# Patient Record
Sex: Female | Born: 1945 | Race: White | Hispanic: No | Smoking: Former smoker
Health system: Southern US, Community
[De-identification: ages and names within clinical notes are randomized; demographics above are authoritative.]

## PROBLEM LIST (undated history)

## (undated) DIAGNOSIS — F17200 Nicotine dependence, unspecified, uncomplicated: Secondary | ICD-10-CM

## (undated) DIAGNOSIS — S2231XA Fracture of one rib, right side, initial encounter for closed fracture: Secondary | ICD-10-CM

## (undated) DIAGNOSIS — J449 Chronic obstructive pulmonary disease, unspecified: Secondary | ICD-10-CM

## (undated) DIAGNOSIS — K3184 Gastroparesis: Secondary | ICD-10-CM

## (undated) DIAGNOSIS — K2980 Duodenitis without bleeding: Secondary | ICD-10-CM

## (undated) DIAGNOSIS — I219 Acute myocardial infarction, unspecified: Secondary | ICD-10-CM

## (undated) DIAGNOSIS — I10 Essential (primary) hypertension: Secondary | ICD-10-CM

## (undated) DIAGNOSIS — Z860100 Personal history of colon polyps, unspecified: Secondary | ICD-10-CM

## (undated) DIAGNOSIS — H409 Unspecified glaucoma: Secondary | ICD-10-CM

## (undated) DIAGNOSIS — I7090 Unspecified atherosclerosis: Secondary | ICD-10-CM

## (undated) DIAGNOSIS — K5730 Diverticulosis of large intestine without perforation or abscess without bleeding: Secondary | ICD-10-CM

## (undated) DIAGNOSIS — G43909 Migraine, unspecified, not intractable, without status migrainosus: Secondary | ICD-10-CM

## (undated) DIAGNOSIS — N816 Rectocele: Secondary | ICD-10-CM

## (undated) DIAGNOSIS — J302 Other seasonal allergic rhinitis: Secondary | ICD-10-CM

## (undated) DIAGNOSIS — E041 Nontoxic single thyroid nodule: Secondary | ICD-10-CM

## (undated) DIAGNOSIS — K209 Esophagitis, unspecified without bleeding: Secondary | ICD-10-CM

## (undated) DIAGNOSIS — E785 Hyperlipidemia, unspecified: Secondary | ICD-10-CM

## (undated) DIAGNOSIS — H269 Unspecified cataract: Secondary | ICD-10-CM

## (undated) DIAGNOSIS — M1990 Unspecified osteoarthritis, unspecified site: Secondary | ICD-10-CM

## (undated) DIAGNOSIS — I2510 Atherosclerotic heart disease of native coronary artery without angina pectoris: Secondary | ICD-10-CM

## (undated) DIAGNOSIS — R42 Dizziness and giddiness: Secondary | ICD-10-CM

## (undated) DIAGNOSIS — C4490 Unspecified malignant neoplasm of skin, unspecified: Secondary | ICD-10-CM

## (undated) DIAGNOSIS — D351 Benign neoplasm of parathyroid gland: Secondary | ICD-10-CM

## (undated) DIAGNOSIS — R112 Nausea with vomiting, unspecified: Secondary | ICD-10-CM

## (undated) DIAGNOSIS — R079 Chest pain, unspecified: Secondary | ICD-10-CM

## (undated) DIAGNOSIS — C55 Malignant neoplasm of uterus, part unspecified: Secondary | ICD-10-CM

## (undated) DIAGNOSIS — K219 Gastro-esophageal reflux disease without esophagitis: Secondary | ICD-10-CM

## (undated) DIAGNOSIS — E559 Vitamin D deficiency, unspecified: Secondary | ICD-10-CM

## (undated) DIAGNOSIS — M5126 Other intervertebral disc displacement, lumbar region: Secondary | ICD-10-CM

## (undated) DIAGNOSIS — R0602 Shortness of breath: Secondary | ICD-10-CM

## (undated) DIAGNOSIS — F419 Anxiety disorder, unspecified: Secondary | ICD-10-CM

## (undated) DIAGNOSIS — E1143 Type 2 diabetes mellitus with diabetic autonomic (poly)neuropathy: Secondary | ICD-10-CM

## (undated) DIAGNOSIS — K2940 Chronic atrophic gastritis without bleeding: Secondary | ICD-10-CM

## (undated) DIAGNOSIS — K222 Esophageal obstruction: Secondary | ICD-10-CM

## (undated) DIAGNOSIS — Z87820 Personal history of traumatic brain injury: Secondary | ICD-10-CM

## (undated) DIAGNOSIS — H04123 Dry eye syndrome of bilateral lacrimal glands: Secondary | ICD-10-CM

## (undated) DIAGNOSIS — K469 Unspecified abdominal hernia without obstruction or gangrene: Secondary | ICD-10-CM

## (undated) DIAGNOSIS — Z9889 Other specified postprocedural states: Secondary | ICD-10-CM

## (undated) DIAGNOSIS — M8580 Other specified disorders of bone density and structure, unspecified site: Secondary | ICD-10-CM

## (undated) DIAGNOSIS — M797 Fibromyalgia: Secondary | ICD-10-CM

## (undated) HISTORY — DX: Type 2 diabetes mellitus with diabetic autonomic (poly)neuropathy: E11.43

## (undated) HISTORY — PX: OTHER SURGICAL HISTORY: SHX169

## (undated) HISTORY — DX: Duodenitis without bleeding: K29.80

## (undated) HISTORY — PX: COLONOSCOPY: SHX174

## (undated) HISTORY — DX: Unspecified cataract: H26.9

## (undated) HISTORY — PX: TUBAL LIGATION: SHX77

## (undated) HISTORY — DX: Esophagitis, unspecified without bleeding: K20.90

## (undated) HISTORY — DX: Personal history of colonic polyps: Z86.010

## (undated) HISTORY — DX: Nontoxic single thyroid nodule: E04.1

## (undated) HISTORY — PX: APPENDECTOMY: SHX54

## (undated) HISTORY — PX: CHOLECYSTECTOMY: SHX55

## (undated) HISTORY — PX: BLADDER SURGERY: SHX569

## (undated) HISTORY — PX: ESOPHAGOGASTRODUODENOSCOPY: SHX1529

## (undated) HISTORY — DX: Personal history of colon polyps, unspecified: Z86.0100

## (undated) HISTORY — DX: Nicotine dependence, unspecified, uncomplicated: F17.200

## (undated) HISTORY — DX: Other specified disorders of bone density and structure, unspecified site: M85.80

## (undated) HISTORY — DX: Esophagitis, unspecified: K20.9

## (undated) HISTORY — DX: Esophageal obstruction: K22.2

## (undated) HISTORY — DX: Fibromyalgia: M79.7

## (undated) HISTORY — DX: Atherosclerotic heart disease of native coronary artery without angina pectoris: I25.10

## (undated) HISTORY — DX: Chronic obstructive pulmonary disease, unspecified: J44.9

## (undated) HISTORY — DX: Unspecified osteoarthritis, unspecified site: M19.90

## (undated) HISTORY — DX: Other seasonal allergic rhinitis: J30.2

## (undated) HISTORY — DX: Migraine, unspecified, not intractable, without status migrainosus: G43.909

## (undated) HISTORY — DX: Hyperlipidemia, unspecified: E78.5

## (undated) HISTORY — DX: Unspecified glaucoma: H40.9

## (undated) HISTORY — DX: Malignant neoplasm of uterus, part unspecified: C55

## (undated) HISTORY — PX: TONSILLECTOMY: SUR1361

## (undated) HISTORY — PX: LEEP: SHX91

## (undated) HISTORY — DX: Gastroparesis: K31.84

## (undated) HISTORY — DX: Chronic atrophic gastritis without bleeding: K29.40

## (undated) HISTORY — PX: DILATION AND EVACUATION: SHX1459

---

## 1987-02-07 DIAGNOSIS — C55 Malignant neoplasm of uterus, part unspecified: Secondary | ICD-10-CM

## 1987-02-07 HISTORY — DX: Malignant neoplasm of uterus, part unspecified: C55

## 1987-12-08 HISTORY — PX: VAGINAL HYSTERECTOMY: SUR661

## 1998-02-19 ENCOUNTER — Encounter: Admission: RE | Admit: 1998-02-19 | Discharge: 1998-05-20 | Payer: Self-pay | Admitting: Anesthesiology

## 1998-06-14 ENCOUNTER — Other Ambulatory Visit: Admission: RE | Admit: 1998-06-14 | Discharge: 1998-06-14 | Payer: Self-pay | Admitting: Obstetrics & Gynecology

## 1999-08-09 ENCOUNTER — Other Ambulatory Visit: Admission: RE | Admit: 1999-08-09 | Discharge: 1999-08-09 | Payer: Self-pay | Admitting: Obstetrics and Gynecology

## 2000-05-15 ENCOUNTER — Encounter (INDEPENDENT_AMBULATORY_CARE_PROVIDER_SITE_OTHER): Payer: Self-pay | Admitting: Gastroenterology

## 2000-10-02 ENCOUNTER — Other Ambulatory Visit: Admission: RE | Admit: 2000-10-02 | Discharge: 2000-10-02 | Payer: Self-pay | Admitting: Obstetrics and Gynecology

## 2001-02-08 ENCOUNTER — Emergency Department (HOSPITAL_COMMUNITY): Admission: EM | Admit: 2001-02-08 | Discharge: 2001-02-08 | Payer: Self-pay | Admitting: Emergency Medicine

## 2001-02-08 ENCOUNTER — Encounter: Payer: Self-pay | Admitting: Emergency Medicine

## 2001-02-11 ENCOUNTER — Observation Stay (HOSPITAL_COMMUNITY): Admission: RE | Admit: 2001-02-11 | Discharge: 2001-02-12 | Payer: Self-pay | Admitting: Urology

## 2001-02-11 ENCOUNTER — Encounter: Payer: Self-pay | Admitting: Urology

## 2001-08-13 ENCOUNTER — Encounter: Payer: Self-pay | Admitting: Family Medicine

## 2001-08-13 ENCOUNTER — Encounter: Admission: RE | Admit: 2001-08-13 | Discharge: 2001-08-13 | Payer: Self-pay | Admitting: Family Medicine

## 2001-11-20 ENCOUNTER — Other Ambulatory Visit: Admission: RE | Admit: 2001-11-20 | Discharge: 2001-11-20 | Payer: Self-pay | Admitting: Obstetrics and Gynecology

## 2002-12-10 ENCOUNTER — Other Ambulatory Visit: Admission: RE | Admit: 2002-12-10 | Discharge: 2002-12-10 | Payer: Self-pay | Admitting: Obstetrics and Gynecology

## 2003-01-22 ENCOUNTER — Emergency Department (HOSPITAL_COMMUNITY): Admission: EM | Admit: 2003-01-22 | Discharge: 2003-01-22 | Payer: Self-pay | Admitting: Emergency Medicine

## 2003-03-10 DIAGNOSIS — M5126 Other intervertebral disc displacement, lumbar region: Secondary | ICD-10-CM

## 2003-03-10 DIAGNOSIS — M5136 Other intervertebral disc degeneration, lumbar region: Secondary | ICD-10-CM

## 2003-03-10 DIAGNOSIS — M51369 Other intervertebral disc degeneration, lumbar region without mention of lumbar back pain or lower extremity pain: Secondary | ICD-10-CM

## 2003-03-10 HISTORY — DX: Other intervertebral disc degeneration, lumbar region without mention of lumbar back pain or lower extremity pain: M51.369

## 2003-03-10 HISTORY — DX: Other intervertebral disc displacement, lumbar region: M51.26

## 2003-03-10 HISTORY — DX: Other intervertebral disc degeneration, lumbar region: M51.36

## 2003-03-13 ENCOUNTER — Encounter: Admission: RE | Admit: 2003-03-13 | Discharge: 2003-03-13 | Payer: Self-pay | Admitting: Orthopedic Surgery

## 2003-07-29 ENCOUNTER — Encounter: Admission: RE | Admit: 2003-07-29 | Discharge: 2003-07-29 | Payer: Self-pay | Admitting: Family Medicine

## 2004-11-14 ENCOUNTER — Ambulatory Visit: Payer: Self-pay | Admitting: Gastroenterology

## 2004-11-16 ENCOUNTER — Ambulatory Visit: Payer: Self-pay | Admitting: Cardiology

## 2004-12-06 ENCOUNTER — Ambulatory Visit: Payer: Self-pay | Admitting: Gastroenterology

## 2004-12-15 ENCOUNTER — Ambulatory Visit: Payer: Self-pay | Admitting: Gastroenterology

## 2005-03-02 ENCOUNTER — Other Ambulatory Visit: Admission: RE | Admit: 2005-03-02 | Discharge: 2005-03-02 | Payer: Self-pay | Admitting: Obstetrics and Gynecology

## 2005-10-03 ENCOUNTER — Encounter: Admission: RE | Admit: 2005-10-03 | Discharge: 2005-10-03 | Payer: Self-pay | Admitting: Family Medicine

## 2007-03-22 ENCOUNTER — Ambulatory Visit: Payer: Self-pay | Admitting: Internal Medicine

## 2007-04-26 ENCOUNTER — Encounter: Payer: Self-pay | Admitting: Internal Medicine

## 2007-04-26 ENCOUNTER — Ambulatory Visit: Payer: Self-pay | Admitting: Internal Medicine

## 2007-05-02 DIAGNOSIS — Z860100 Personal history of colon polyps, unspecified: Secondary | ICD-10-CM | POA: Insufficient documentation

## 2008-07-02 ENCOUNTER — Ambulatory Visit (HOSPITAL_BASED_OUTPATIENT_CLINIC_OR_DEPARTMENT_OTHER): Admission: RE | Admit: 2008-07-02 | Discharge: 2008-07-02 | Payer: Self-pay | Admitting: General Surgery

## 2008-07-02 ENCOUNTER — Encounter (INDEPENDENT_AMBULATORY_CARE_PROVIDER_SITE_OTHER): Payer: Self-pay | Admitting: General Surgery

## 2010-05-17 LAB — CBC
HCT: 42.3 % (ref 36.0–46.0)
Hemoglobin: 14.7 g/dL (ref 12.0–15.0)
MCHC: 34.8 g/dL (ref 30.0–36.0)
MCV: 88.9 fL (ref 78.0–100.0)
Platelets: 238 10*3/uL (ref 150–400)
RDW: 13.6 % (ref 11.5–15.5)

## 2010-05-17 LAB — DIFFERENTIAL
Basophils Absolute: 0 10*3/uL (ref 0.0–0.1)
Eosinophils Absolute: 0.1 10*3/uL (ref 0.0–0.7)
Eosinophils Relative: 2 % (ref 0–5)
Lymphocytes Relative: 29 % (ref 12–46)
Monocytes Absolute: 0.4 10*3/uL (ref 0.1–1.0)

## 2010-05-17 LAB — GLUCOSE, CAPILLARY
Glucose-Capillary: 115 mg/dL — ABNORMAL HIGH (ref 70–99)
Glucose-Capillary: 138 mg/dL — ABNORMAL HIGH (ref 70–99)

## 2010-05-17 LAB — BASIC METABOLIC PANEL
BUN: 7 mg/dL (ref 6–23)
CO2: 28 mEq/L (ref 19–32)
Chloride: 103 mEq/L (ref 96–112)
GFR calc non Af Amer: >60 mL/min (ref >60)
Glucose, Bld: 165 mg/dL — ABNORMAL HIGH (ref 70–99)
Potassium: 4.9 mEq/L (ref 3.5–5.1)
Sodium: 138 mEq/L (ref 135–145)

## 2010-06-21 NOTE — Assessment & Plan Note (Signed)
[facility]                         GASTROENTEROLOGY OFFICE NOTE   NAME:Wood, Valerie Wood                        MRN:          [id number]  DATE:03/22/2007                            DOB:          11-02-1945    CHIEF COMPLAINT:  Epigastric pain, indigestion.   Valerie Wood is a lady previously followed by Dr. Corinda Gubler.  She has known  reflux disease, even a history of peptic stricture dilated in 2006 at  EGD.  She somehow got off of her Prilosec because she was feeling well.  She is now having daily burning and heartburn, cannot sleep either.  She  is using Pepcid AC with incomplete relief.  There is no dysphagia.   She has some chronic diarrhea symptoms which are actually slightly  better on the antacid she is taking.  The chronic diarrhea was worse,  post cholecystectomy.  She has had the chronic smoker's cough.  She also  has had some sinus drainage and a scanty amount of hemoptysis the other  day but this has not recurred.  She is advised to follow with Dr.  Arvilla Market on that if it does recur.   She does not drink much caffeine.  She is a smoker and she is advised  that quitting is to her benefit.  She has quit multiple times before but  cannot stay off, she says.   PAST MEDICAL HISTORY:  1. Diabetes mellitus, diet-controlled.  2. Dyslipidemia.  3. Cholecystectomy.  4. Hypertension.  5. Gastroesophageal reflux disease with stricture and hiatal hernia.  6. Anxiety and panic disorder.  7. Prior appendectomy.  8. Prior tubal ligation.  9. Hysterectomy, she does not know if she has had an oophorectomy.  10.She had cervical cancer.  11.Irritable bowel syndrome from review of the chart.  She concurs.  12.Chronic headaches.  13.Chronic vertigo.  14.Allergic rhinosinusitis.  15.History of adenomatous colon polyp in 1998.  16.Colonoscopy 2002, with multiple diminutive polyps destroyed but no      pathology.   MEDICATIONS:  Vytorin, Lisinopril,  Pepcid-AC.  She is on Meclizine,  Dramamine, Rolaids, Mylanta, and Mucinex p.r.n.   ALLERGIES:  MORPHINE.   SOCIAL HISTORY:  She is caring for a grandchild whom she has adopted.  She is retired for 8 years.  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  Rare alcohol.  Tobacco as above.   PHYSICAL EXAMINATION:  VITAL SIGNS:  Weight 162 pounds, pulse 82, blood  pressure 130/84.  EYES:  Anicteric.  LUNGS:  Clear.  HEART:  S1 S2.  No gallops.  ABDOMEN:  Soft, nontender, no organomegaly, no masses.  NEUROLOGIC:  She is alert and oriented x3.   ASSESSMENT:  1. Gastroesophageal reflux disease, worse off of proton pump      inhibitor.  Plan:  Restart Prilosec OTC at twice a day for a month      and then daily.  We have prescribed what I think is generic      omeprazole which may be more cost effective for her through her 3-      month prescription program.  2. Personal  history of colon polyps and there is actually a family      history of colon cancer as well.  Her father died in his 28s.      Colonoscopy will be scheduled for followup surveillance.  3. Diarrhea predominant irritable bowel syndrome.  Some of this may be      post cholecystectomy diarrhea.  I have advised her to use Imodium      to see if that does not help.     Iva Boop, MD,FACG  Electronically Signed    CEG/MedQ  DD: 03/22/2007  DT: 03/24/2007  Job #: [id number]   cc:   Donia Guiles, M.D.

## 2010-06-21 NOTE — Op Note (Signed)
NAMEAMRY, CATHY                 ACCOUNT NO.:  [id number]   MEDICAL RECORD NO.:  [id number]          PATIENT TYPE:  AMB   LOCATION:  DSC                          FACILITY:  [facility]   PHYSICIAN:  Almond Lint, MD       DATE OF BIRTH:  [birth year]   DATE OF PROCEDURE:  07/02/2008  DATE OF DISCHARGE:                               OPERATIVE REPORT   PREOPERATIVE DIAGNOSIS:  Left breast masses.   POSTOPERATIVE DIAGNOSIS:  Left breast masses.   PROCEDURE PERFORMED:  Excision of left breast masses x4.   SURGEON:  Almond Lint, MD   ASSISTANT:  None.   ANESTHESIA:  General and local.   FINDINGS:  Questionable lipomas.  No firm masses in the region of  palpable abnormalities.   SPECIMENS:  Breast biopsy x4 to Pathology.   ESTIMATED BLOOD LOSS:  Minimal.   COMPLICATIONS:  None known.   PROCEDURE:  Ms. Hornberger was identified in the holding area, and the  regions of abnormality were marked preoperatively.  These were marked in  the supine position with the head slightly elevated.  The patient was  then taken to the operating room and placed supine on the operating room  table.  General anesthesia was induced.  The left breast was prepped and  draped in sterile fashion.  Time-out was performed according to surgical  safety checklist.  When all was correct, we continued.  A radial  incision approximately 3 cm in length was made approximately 8 o'clock  near the perforated breast.  The incision was made such that all of the  regions of abnormality could be reached from the same incision.  The  skin was anesthetized with local anesthesia and the incision made.  Skin  flaps were created with the assistance of skin hooks.  The most distinct  abnormality was identified first and elevated with an Allis clamp.  This  was then removed with a Bovie.  Upon palpation, it felt like just a  fatty tissue.  The abnormality was then gone after removing this area.  A second adjacent abnormality was also  elevated with an Allis and  removed.  This also felt like a fatty tissue.  Other abnormality was  much less defined.  There was no significant areas palpable from the  left side of the breast, so I switched to the right side.  There were 2  small areas of thickening that were identified; they felt like normal  breast tissue.  These were in the region where the third area had been  identified by the patient.  These were removed.  These were labeled from  medial to lateral.  The cavity was then inspected for hemostasis.  When  hemostasis was achieved, we then closed the incision with 3-0  Vicryl, deep dermal interrupted, and then 4-0 Monocryl in a running  subcuticular fashion.  The breast was then cleaned, dried, and dressed  with Steri-Strips, gauze, and paper tape.  The patient tolerated the  procedure well and was taken to the PACU in stable condition.      Darrick Huntsman  Donell Beers, MD  Electronically Signed     FB/MEDQ  D:  07/02/2008  T:  07/03/2008  Job:  [id number]

## 2010-06-24 NOTE — Op Note (Signed)
[facility]  Patient:    Valerie Wood, Valerie Wood Visit Number: [id number] MRN: [id number]          Service Type: SUR Location: 3W 0375 02 Attending Physician:  Thermon Leyland Dictated by:   Barron Alvine, M.D. Proc. Date: 02/11/01 Admit Date:  02/11/2001                             Operative Report  PREOPERATIVE DIAGNOSES: 1. Grade 3 cystocele. 2. Stress urinary incontinence.  POSTOPERATIVE DIAGNOSES: 1. Grade 3 cystocele. 2. Stress urinary incontinence.  PROCEDURE PERFORMED:  Anterior repair, pubovaginal sling with cadaveric fascia lata, suprapubic tube placement, and flexible cystoscopy.  SURGEON:  Barron Alvine, M.D.  ANESTHESIA:  General.  INDICATIONS:  The patient is a 65 year old female.  She has a longstanding history of irritative voiding symptoms as well as mixed urinary incontinence. We saw her initially.  She was having nocturia three times per night with severe frequency every 30 minutes.  She also had considerable stress incontinence with evidence of a significant cystocele, hypermobility, and obvious stress incontinence.  She has been placed on antispasmodic medication, to help with her frequency and urgency which did improve, but her incontinence continued to be unchanged.  We offered her the possibility of pubovaginal sling with anterior repair.  We spent extensive time discussing how the procedure was done with the typical success rate, complications, etc.  Full informed consent was obtained and the patient presents now for that procedure.  TECHNIQUE AND FINDINGS:  The patient was brought to the operating room where she had the successful induction of general anesthesia.  She was placed in the moderate lithotomy position, and prepped and draped in the usual manner.  A Foley catheter was placed sterilely, and her bladder was drained.  A vaginal speculum was used.  She had a grade 3 cystocele at rest.  The anterior vaginal mucosa  was infiltrated and then an incision was made from mid urethra back to her scarred surgical cuff.  The tissue planes between the vagina and bladder were easily established.  The entire cystocele was dissected.  Retropubic spaces were entered with the bladder decompressed.  Fingertip dissection was all that was necessary to enter the retropubic spaces bilaterally, and those tissues were noted to be markedly attenuated.  The cystocele itself was then reduced with approximately four 2-0 Vicryl mattress sutures.  With this, we had nice reduction of the cystocele.  We then turned our attention suprapubically, where a small incision was made over the pubic symphysis, and carried down to the level of the anterior fascia.  The sling itself was made with a piece of cadaveric fascia lata measuring approximately 2.5 cm in width and approximately 8 cm in length.  Both ends were sutured with some #1 nylon suture.  With a finger in the retropubic space, we were able to pass the clamp behind the pubic bone, and out the right side of the vaginal incision.  Right fingertip control was used throughout the passage.  The nylon sutures were grabbed and brought out the right-sided suprapubic incision.  The same thing was done on the left side, and in that manner, the sling was positioned underneath the proximal urethra.  We then removed her Foley catheter and performed flexible cystoscopy.  This showed the sling to be in good position. There was no evidence of suture or other bladder injury.  Blue dye could be seen from both orifices.  The suprapubic tube was then placed under direct visual guidance.  We then trimmed an appropriate amount of vaginal mucosa with reduction of the cystocele.  The vaginal incision was then closed with a running 2-0 Vicryl suture.  Everything was copiously irrigated with antibiotic solution.  The suprapubic incision was then closed after tying the sling over two fingers.  Marcaine was  also instilled in the suprapubic incision.  Clips were used to close that.  The suprapubic tube was left to gravity drainage. The Foley catheter was removed.  The vaginal packing was applied.  The patient appeared to tolerate the procedure well.  There were no obvious complications.  She was brought to the recovery room in stable condition. Dictated by:   Barron Alvine, M.D. Attending Physician:  Thermon Leyland DD:  02/11/01 TD:  02/11/01 Job: [id number]

## 2011-01-20 ENCOUNTER — Ambulatory Visit (INDEPENDENT_AMBULATORY_CARE_PROVIDER_SITE_OTHER): Payer: Medicare Other | Admitting: Internal Medicine

## 2011-01-20 DIAGNOSIS — R05 Cough: Secondary | ICD-10-CM

## 2011-01-20 LAB — PULMONARY FUNCTION TEST

## 2011-01-20 NOTE — Progress Notes (Signed)
PFT done today. 

## 2011-01-29 ENCOUNTER — Emergency Department (HOSPITAL_COMMUNITY): Payer: Medicare Other

## 2011-01-29 ENCOUNTER — Other Ambulatory Visit: Payer: Self-pay

## 2011-01-29 ENCOUNTER — Emergency Department (HOSPITAL_COMMUNITY)
Admission: EM | Admit: 2011-01-29 | Discharge: 2011-01-29 | Disposition: A | Discharge status: Home / Self Care | Payer: Medicare Other | Attending: Emergency Medicine | Admitting: Emergency Medicine

## 2011-01-29 ENCOUNTER — Encounter: Payer: Self-pay | Admitting: Nurse Practitioner

## 2011-01-29 DIAGNOSIS — R079 Chest pain, unspecified: Secondary | ICD-10-CM | POA: Insufficient documentation

## 2011-01-29 DIAGNOSIS — J40 Bronchitis, not specified as acute or chronic: Secondary | ICD-10-CM | POA: Insufficient documentation

## 2011-01-29 DIAGNOSIS — F17200 Nicotine dependence, unspecified, uncomplicated: Secondary | ICD-10-CM | POA: Insufficient documentation

## 2011-01-29 DIAGNOSIS — E041 Nontoxic single thyroid nodule: Secondary | ICD-10-CM | POA: Insufficient documentation

## 2011-01-29 HISTORY — DX: Essential (primary) hypertension: I10

## 2011-01-29 LAB — CBC
HCT: 42.7 % (ref 36.0–46.0)
Hemoglobin: 15 g/dL (ref 12.0–15.0)
RDW: 13.6 % (ref 11.5–15.5)
WBC: 9.7 10*3/uL (ref 4.0–10.5)

## 2011-01-29 LAB — BASIC METABOLIC PANEL
BUN: 9 mg/dL (ref 6–23)
Chloride: 103 mEq/L (ref 96–112)
GFR calc Af Amer: >90 mL/min (ref >90)
GFR calc non Af Amer: >90 mL/min (ref >90)
Glucose, Bld: 138 mg/dL — ABNORMAL HIGH (ref 70–99)
Potassium: 3.8 mEq/L (ref 3.5–5.1)
Sodium: 137 mEq/L (ref 135–145)

## 2011-01-29 LAB — POCT I-STAT TROPONIN I

## 2011-01-29 MED ORDER — AZITHROMYCIN 250 MG PO TABS: 250.0000 mg | ORAL_TABLET | Freq: Every day | ORAL | Status: AC

## 2011-01-29 MED ORDER — ALBUTEROL SULFATE HFA 108 (90 BASE) MCG/ACT IN AERS
2.0000 | INHALATION_SPRAY | RESPIRATORY_TRACT | Status: DC | PRN
Start: 2011-01-29 — End: 2011-01-29
  Administered 2011-01-29: 2 via RESPIRATORY_TRACT
  Filled 2011-01-29: qty 6.7

## 2011-01-29 MED ORDER — SODIUM CHLORIDE 0.9 % IV SOLN: Freq: Once | INTRAVENOUS | Status: DC

## 2011-01-29 MED ORDER — AZITHROMYCIN 250 MG PO TABS
500.0000 mg | ORAL_TABLET | Freq: Once | ORAL | Status: AC
  Administered 2011-01-29: 500 mg via ORAL
  Filled 2011-01-29: qty 2

## 2011-01-29 MED ORDER — IOHEXOL 350 MG/ML SOLN: 100.0000 mL | Freq: Once | INTRAVENOUS | Status: DC | PRN

## 2011-01-29 MED ORDER — ONDANSETRON HCL 4 MG/2ML IJ SOLN
4.0000 mg | Freq: Once | INTRAMUSCULAR | Status: AC
  Administered 2011-01-29: 4 mg via INTRAVENOUS
  Filled 2011-01-29: qty 2

## 2011-01-29 MED ORDER — MORPHINE SULFATE 2 MG/ML IJ SOLN
2.0000 mg | Freq: Once | INTRAMUSCULAR | Status: AC
  Administered 2011-01-29: 2 mg via INTRAVENOUS
  Filled 2011-01-29: qty 1

## 2011-01-29 NOTE — ED Notes (Signed)
Patient transported to CT 

## 2011-01-29 NOTE — ED Notes (Signed)
Printed two old ekgs from [facility]. 

## 2011-01-29 NOTE — ED Provider Notes (Signed)
History     CSN: [id number]  Arrival date & time 01/29/11  1227   First MD Initiated Contact with Patient 01/29/11 1341      Chief Complaint  Patient presents with  . Chest Pain    (Consider location/radiation/quality/duration/timing/severity/associated sxs/prior treatment) HPI  Pt presents to the ED with complaints of chest pressure that radiates to the back. Pt has a history of pneumonia 1 month ago and doesn't believe that it went away. Pt still has a cough which she describes has blood in her sputum. She denies shortness of breath or difficulty breathing. She denies having a history of heart problems in the past. Denies weakness, fevers, chills. A&O x 3, NAD.  Past Medical History  Diagnosis Date  . Hypertension     Past Surgical History  Procedure Date  . Cholecystectomy   . Abdominal hysterectomy   . Appendectomy   . Tonsillectomy     History reviewed. No pertinent family history.  History  Substance Use Topics  . Smoking status: Current Everyday Smoker -- 1.0 packs/day  . Smokeless tobacco: Not on file  . Alcohol Use: No    OB History    Grav Para Term Preterm Abortions TAB SAB Ect Mult Living                  Review of Systems  Allergies  Morphine and related and Tetracyclines & related  Home Medications   Current Outpatient Rx  Name Route Sig Dispense Refill  . BUDESONIDE-FORMOTEROL FUMARATE 160-4.5 MCG/ACT IN AERO Inhalation Inhale 2 puffs into the lungs at bedtime.      Marland Kitchen CALTRATE 600+D PLUS 600-400 MG-UNIT PO TABS Oral Take 1 tablet by mouth at bedtime.      Marland Kitchen EZETIMIBE-SIMVASTATIN 10-40 MG PO TABS Oral Take 1 tablet by mouth at bedtime.      . GUAIFENESIN ER 600 MG PO TB12 Oral Take 1,200 mg by mouth at bedtime.      Marland Kitchen LOSARTAN POTASSIUM 50 MG PO TABS Oral Take 50 mg by mouth every evening.      Marland Kitchen OMEPRAZOLE 20 MG PO CPDR Oral Take 20 mg by mouth at bedtime.        BP 113/78  Pulse 86  Temp(Src) 97.7 F (36.5 C) (Oral)  Resp 18  Ht 5'  3" (1.6 m)  Wt 158 lb (71.668 kg)  BMI 27.99 kg/m2  SpO2 98%  Physical Exam  Nursing note and vitals reviewed. Constitutional: She appears well-developed and well-nourished.  HENT:  Head: Normocephalic and atraumatic.  Eyes: Conjunctivae are normal. Pupils are equal, round, and reactive to light.  Neck: Trachea normal, normal range of motion and full passive range of motion without pain. Neck supple.  Cardiovascular: Normal rate, regular rhythm and normal pulses.   Pulmonary/Chest: Effort normal and breath sounds normal. No respiratory distress. She has no wheezes. She has no rales. Chest wall is not dull to percussion. She exhibits no tenderness, no crepitus, no edema, no deformity and no retraction.       cough  Abdominal: Soft. Normal appearance and bowel sounds are normal.  Musculoskeletal: Normal range of motion.  Neurological: She is alert. She has normal strength.  Skin: Skin is warm, dry and intact.  Psychiatric: Her speech is normal. Cognition and memory are normal.    ED Course  Procedures (including critical care time)   Labs Reviewed  I-STAT TROPONIN I  CBC  BASIC METABOLIC PANEL   Dg Chest 2 View  01/29/2011  *  RADIOLOGY REPORT*  Clinical Data: Chest pain, shortness of breath  CHEST - 2 VIEW  Comparison: 12/20/2009  Findings: Normal heart size with stable mild interstitial prominence.  Slightly lower lung volumes.  No definite pneumonia, collapse, consolidation, effusion or pneumothorax.  Trachea midline.  Previous cholecystectomy noted.  IMPRESSION: Stable chest exam.  No superimposed acute process.  Slight interstitial prominence.  Original Report Authenticated By: Judie Petit. Ruel Favors, M.D.     No diagnosis found.    MDM   Date: 01/29/2011  Rate: 84  Rhythm: normal sinus rhythm  QRS Axis: left  Intervals: normal  ST/T Wave abnormalities: nonspecific ST/T changes  Conduction Disutrbances:none  Narrative Interpretation:   Old EKG Reviewed: unchanged from  2003  Dr. Deretha Emory examined patient and believes symptoms are most likely not cardiac he recommends CT angio of chest to r/o PE.  Will order CT angio chest to r/o PE due to cough, pressure with blood tinged sputum. If negative, pt can be diagnosed with bronchitis, given a zpack and inhaler and told to follow-up with her PCP.        Dorthula Matas, PA 01/29/11 [phone]

## 2011-01-29 NOTE — ED Provider Notes (Signed)
Medical screening examination/treatment/procedure(s) were performed by non-physician practitioner and as supervising physician I was immediately available for consultation/collaboration.    M , MD 01/29/11 2343 

## 2011-01-29 NOTE — ED Provider Notes (Signed)
Medical screening examination/treatment/procedure(s) were conducted as a shared visit with non-physician practitioner(s) and myself.  I personally evaluated the patient during the encounter  Patient seen by me. Productive cough with some blood streak sputum also mild shortness of breath for the past 2 days or longer. She with anterior chest pain that radiates to the upper back. No leg swell. Has had chest discomfort for a number of days on and off more on than off. Cardiac marker negative EKG without acute change. Suspect a viral bronchitis however with the pain in the chest: The back and some bloody sputum will get CT angiogram rule out PE. If negative will treat as if bronchitis with albuterol inhaler and a trial Z-Pak.  Patient can be moved to the CDU awaiting the CT angio.  Shelda Jakes, MD 01/29/11 [phone]

## 2011-01-29 NOTE — ED Notes (Signed)
Pt c/o mid-sternum chest pain radiating to mid-back region and to bilateral sides x3 days, sob, productive cough w/yellow color sputum w/streaks of bright red blood, and mid abd pain, pt denies N/V or sweating. Pt's husband recently dx w/Parkinsons on December 17, pt denies any relation to husband new dx. Pt reports she was dx last Sept./Oct w/pneumonia, sts her s/s feel similar. Pt also reports a burning sensation to her tongue

## 2011-01-29 NOTE — ED Notes (Signed)
Patients ekg done and given to dr. Alto Denver.

## 2011-01-29 NOTE — ED Notes (Signed)
Called CT Scan and they are aware that patient is ready for the scanner

## 2011-01-29 NOTE — ED Notes (Signed)
Pt given po fluid and crackers

## 2011-01-29 NOTE — ED Notes (Signed)
C/o chest pain, bloody sputum, mild SOB over past 2 days. Nothing improves or relieves the pain. Pain radiating into upper back. No recent travel

## 2011-01-29 NOTE — ED Provider Notes (Signed)
Radiology results reviewed and discussed with patient.  No evidence of PE.  Small thyroid nodule incidentally noted--will have patient follow-up with her PCP.  Patient will be discharged home with z-pack and albuterol MDI for suspected bronchitis.  Jimmye Norman, NP 01/29/11 2027

## 2011-02-03 ENCOUNTER — Encounter: Payer: Self-pay | Admitting: Internal Medicine

## 2011-02-08 ENCOUNTER — Other Ambulatory Visit: Payer: Self-pay | Admitting: Family Medicine

## 2011-02-08 DIAGNOSIS — E041 Nontoxic single thyroid nodule: Secondary | ICD-10-CM

## 2011-02-09 ENCOUNTER — Ambulatory Visit
Admission: RE | Admit: 2011-02-09 | Discharge: 2011-02-09 | Disposition: A | Discharge status: Home / Self Care | Payer: Medicare Other | Source: Ambulatory Visit | Attending: Family Medicine | Admitting: Family Medicine

## 2011-02-09 DIAGNOSIS — E041 Nontoxic single thyroid nodule: Secondary | ICD-10-CM

## 2011-02-13 ENCOUNTER — Other Ambulatory Visit: Payer: Self-pay | Admitting: Family Medicine

## 2011-02-13 DIAGNOSIS — E041 Nontoxic single thyroid nodule: Secondary | ICD-10-CM

## 2011-02-15 ENCOUNTER — Other Ambulatory Visit: Payer: Self-pay | Admitting: Family Medicine

## 2011-02-15 ENCOUNTER — Ambulatory Visit
Admission: RE | Admit: 2011-02-15 | Discharge: 2011-02-15 | Disposition: A | Discharge status: Home / Self Care | Payer: Medicare Other | Source: Ambulatory Visit | Attending: Family Medicine | Admitting: Family Medicine

## 2011-02-15 DIAGNOSIS — E041 Nontoxic single thyroid nodule: Secondary | ICD-10-CM

## 2011-04-18 ENCOUNTER — Encounter (HOSPITAL_COMMUNITY): Payer: Self-pay | Admitting: Pharmacist

## 2011-05-01 ENCOUNTER — Encounter (HOSPITAL_COMMUNITY)
Admission: RE | Admit: 2011-05-01 | Discharge: 2011-05-01 | Disposition: A | Discharge status: Home / Self Care | Payer: Medicare Other | Source: Ambulatory Visit | Attending: Obstetrics and Gynecology | Admitting: Obstetrics and Gynecology

## 2011-05-01 ENCOUNTER — Encounter (HOSPITAL_COMMUNITY): Payer: Self-pay

## 2011-05-01 HISTORY — DX: Shortness of breath: R06.02

## 2011-05-01 HISTORY — DX: Gastro-esophageal reflux disease without esophagitis: K21.9

## 2011-05-01 HISTORY — DX: Anxiety disorder, unspecified: F41.9

## 2011-05-01 LAB — COMPREHENSIVE METABOLIC PANEL
AST: 25 U/L (ref 0–37)
BUN: 8 mg/dL (ref 6–23)
CO2: 26 mEq/L (ref 19–32)
Calcium: 9.8 mg/dL (ref 8.4–10.5)
Chloride: 97 mEq/L (ref 96–112)
Creatinine, Ser: 0.58 mg/dL (ref 0.50–1.10)
GFR calc Af Amer: >90 mL/min (ref >90)
GFR calc non Af Amer: >90 mL/min (ref >90)
Glucose, Bld: 318 mg/dL — ABNORMAL HIGH (ref 70–99)
Total Bilirubin: 0.5 mg/dL (ref 0.3–1.2)

## 2011-05-01 LAB — CBC
HCT: 40.1 % (ref 36.0–46.0)
MCH: 29.9 pg (ref 26.0–34.0)
MCV: 87 fL (ref 78.0–100.0)
RBC: 4.61 MIL/uL (ref 3.87–5.11)
WBC: 6.8 10*3/uL (ref 4.0–10.5)

## 2011-05-01 NOTE — Patient Instructions (Addendum)
YOUR PROCEDURE IS SCHEDULED ON:05/11/11  ENTER THROUGH THE MAIN ENTRANCE OF [facility] AT:6am  USE DESK PHONE AND DIAL [phone] TO INFORM us OF YOUR ARRIVAL  CALL [phone] IF YOU HAVE ANY QUESTIONS OR PROBLEMS PRIOR TO YOUR ARRIVAL.  REMEMBER: DO NOT EAT OR DRINK AFTER MIDNIGHT : Wed  SPECIAL INSTRUCTIONS:   YOU MAY BRUSH YOUR TEETH THE MORNING OF SURGERY   TAKE THESE MEDICINES THE DAY OF SURGERY WITH SIP OF WATER:none   DO NOT WEAR JEWELRY, EYE MAKEUP, LIPSTICK OR DARK FINGERNAIL POLISH DO NOT WEAR LOTIONS OR DEODORANT DO NOT SHAVE FOR 48 HOURS PRIOR TO SURGERY  YOU WILL NOT BE ALLOWED TO DRIVE YOURSELF HOME.  NAME OF DRIVER:Angela- [phone]

## 2011-05-01 NOTE — Pre-Procedure Instructions (Addendum)
Dr. Malen Gauze notified of pt's glucose result of 318, obtained in preop testing this am. Dr. Malen Gauze suggests that  Dr. Jackelyn Knife  cancel surgery until glucose is stable.  Several attempts made to call Dr. Eligha Bridegroom office but apparently there is some trouble on the line for hospitals and physicians to use..I  called main # again and left message on appointments extension.

## 2011-05-02 ENCOUNTER — Encounter (HOSPITAL_COMMUNITY): Payer: Self-pay

## 2011-05-02 NOTE — Pre-Procedure Instructions (Signed)
I spoke with Misty Stanley, Dr. Meisinger's nurse, and she has office notes that state Valerie Wood has type 2 DM, controlled by diet. Misty Stanley will call back with feedback from Dr. Jackelyn Knife.

## 2011-05-10 NOTE — Pre-Procedure Instructions (Signed)
I called Dr. Eligha Bridegroom office to inquire about pt's glucose result, done today, in his office. Result was 170 and surgery will not be cancelled per [facility] in office. Dr. Sherron Ales notified.

## 2011-05-11 ENCOUNTER — Inpatient Hospital Stay (HOSPITAL_COMMUNITY): Payer: Medicare Other | Admitting: Anesthesiology

## 2011-05-11 ENCOUNTER — Encounter (HOSPITAL_COMMUNITY): Payer: Self-pay | Admitting: Anesthesiology

## 2011-05-11 ENCOUNTER — Encounter (HOSPITAL_COMMUNITY)
Admission: RE | Disposition: A | Discharge status: Home / Self Care | Payer: Self-pay | Source: Ambulatory Visit | Attending: Obstetrics and Gynecology

## 2011-05-11 ENCOUNTER — Inpatient Hospital Stay (HOSPITAL_COMMUNITY)
Admission: RE | Admit: 2011-05-11 | Discharge: 2011-05-12 | DRG: 747 | Disposition: A | Discharge status: Home / Self Care | Payer: Medicare Other | Source: Ambulatory Visit | Attending: Obstetrics and Gynecology | Admitting: Obstetrics and Gynecology

## 2011-05-11 ENCOUNTER — Encounter (HOSPITAL_COMMUNITY): Payer: Self-pay | Admitting: *Deleted

## 2011-05-11 DIAGNOSIS — N993 Prolapse of vaginal vault after hysterectomy: Principal | ICD-10-CM | POA: Diagnosis present

## 2011-05-11 DIAGNOSIS — K469 Unspecified abdominal hernia without obstruction or gangrene: Secondary | ICD-10-CM | POA: Diagnosis present

## 2011-05-11 DIAGNOSIS — N816 Rectocele: Secondary | ICD-10-CM

## 2011-05-11 DIAGNOSIS — E119 Type 2 diabetes mellitus without complications: Secondary | ICD-10-CM | POA: Diagnosis present

## 2011-05-11 DIAGNOSIS — I10 Essential (primary) hypertension: Secondary | ICD-10-CM | POA: Diagnosis present

## 2011-05-11 DIAGNOSIS — N815 Vaginal enterocele: Secondary | ICD-10-CM | POA: Diagnosis present

## 2011-05-11 HISTORY — PX: RECTOCELE REPAIR: SHX761

## 2011-05-11 LAB — GLUCOSE, CAPILLARY
Glucose-Capillary: 248 mg/dL — ABNORMAL HIGH (ref 70–99)
Glucose-Capillary: 168 mg/dL — ABNORMAL HIGH (ref 70–99)

## 2011-05-11 SURGERY — COLPORRHAPHY, POSTERIOR, FOR RECTOCELE REPAIR
Anesthesia: General | Wound class: Clean Contaminated

## 2011-05-11 MED ORDER — ALUM & MAG HYDROXIDE-SIMETH 200-200-20 MG/5ML PO SUSP: 30.0000 mL | ORAL | Status: DC | PRN

## 2011-05-11 MED ORDER — KETOROLAC TROMETHAMINE 30 MG/ML IJ SOLN: 30.0000 mg | Freq: Four times a day (QID) | INTRAMUSCULAR | Status: DC

## 2011-05-11 MED ORDER — SIMETHICONE 80 MG PO CHEW: 80.0000 mg | CHEWABLE_TABLET | Freq: Four times a day (QID) | ORAL | Status: DC | PRN

## 2011-05-11 MED ORDER — FENTANYL CITRATE 0.05 MG/ML IJ SOLN
INTRAMUSCULAR | Status: AC
  Filled 2011-05-11: qty 5

## 2011-05-11 MED ORDER — DIPHENHYDRAMINE HCL 12.5 MG/5ML PO ELIX
12.5000 mg | ORAL_SOLUTION | Freq: Four times a day (QID) | ORAL | Status: DC | PRN
Start: 2011-05-11 — End: 2011-05-12

## 2011-05-11 MED ORDER — ONDANSETRON HCL 4 MG/2ML IJ SOLN
4.0000 mg | Freq: Four times a day (QID) | INTRAMUSCULAR | Status: DC | PRN
  Administered 2011-05-11 (×2): 4 mg via INTRAVENOUS
  Filled 2011-05-11 (×2): qty 2

## 2011-05-11 MED ORDER — KETOROLAC TROMETHAMINE 30 MG/ML IJ SOLN
15.0000 mg | Freq: Once | INTRAMUSCULAR | Status: AC | PRN
  Administered 2011-05-11: 30 mg via INTRAVENOUS

## 2011-05-11 MED ORDER — CEFAZOLIN SODIUM 1-5 GM-% IV SOLN
INTRAVENOUS | Status: AC
  Filled 2011-05-11: qty 50

## 2011-05-11 MED ORDER — FENTANYL CITRATE 0.05 MG/ML IJ SOLN
INTRAMUSCULAR | Status: DC | PRN
  Administered 2011-05-11 (×2): 50 ug via INTRAVENOUS
  Administered 2011-05-11: 25 ug via INTRAVENOUS

## 2011-05-11 MED ORDER — PROPOFOL 10 MG/ML IV EMUL
INTRAVENOUS | Status: DC | PRN
  Administered 2011-05-11 (×2): 10 mg via INTRAVENOUS
  Administered 2011-05-11: 180 mg via INTRAVENOUS

## 2011-05-11 MED ORDER — EPHEDRINE 5 MG/ML INJ
INTRAVENOUS | Status: AC
  Filled 2011-05-11: qty 10

## 2011-05-11 MED ORDER — KETOROLAC TROMETHAMINE 30 MG/ML IJ SOLN
30.0000 mg | Freq: Four times a day (QID) | INTRAMUSCULAR | Status: DC
  Administered 2011-05-11 – 2011-05-12 (×2): 30 mg via INTRAVENOUS
  Filled 2011-05-11 (×2): qty 1

## 2011-05-11 MED ORDER — FENTANYL CITRATE 0.05 MG/ML IJ SOLN: 25.0000 ug | INTRAMUSCULAR | Status: DC | PRN

## 2011-05-11 MED ORDER — MIDAZOLAM HCL 5 MG/5ML IJ SOLN
INTRAMUSCULAR | Status: DC | PRN
  Administered 2011-05-11 (×2): 1 mg via INTRAVENOUS

## 2011-05-11 MED ORDER — EPHEDRINE SULFATE 50 MG/ML IJ SOLN
INTRAMUSCULAR | Status: DC | PRN
  Administered 2011-05-11: 5 mg via INTRAVENOUS
  Administered 2011-05-11: 10 mg via INTRAVENOUS

## 2011-05-11 MED ORDER — SODIUM CHLORIDE 0.9 % IJ SOLN: 9.0000 mL | INTRAMUSCULAR | Status: DC | PRN

## 2011-05-11 MED ORDER — LACTATED RINGERS IV SOLN
INTRAVENOUS | Status: DC
  Administered 2011-05-11 (×3): via INTRAVENOUS

## 2011-05-11 MED ORDER — ONDANSETRON HCL 4 MG/2ML IJ SOLN: 4.0000 mg | Freq: Four times a day (QID) | INTRAMUSCULAR | Status: DC | PRN

## 2011-05-11 MED ORDER — ESTRADIOL 0.1 MG/GM VA CREA
TOPICAL_CREAM | VAGINAL | Status: AC
  Filled 2011-05-11: qty 42.5

## 2011-05-11 MED ORDER — ZOLPIDEM TARTRATE 5 MG PO TABS: 5.0000 mg | ORAL_TABLET | Freq: Every evening | ORAL | Status: DC | PRN

## 2011-05-11 MED ORDER — KETOROLAC TROMETHAMINE 30 MG/ML IJ SOLN
30.0000 mg | Freq: Once | INTRAMUSCULAR | Status: DC
Start: 2011-05-11 — End: 2011-05-11

## 2011-05-11 MED ORDER — PANTOPRAZOLE SODIUM 40 MG PO TBEC
40.0000 mg | DELAYED_RELEASE_TABLET | Freq: Every day | ORAL | Status: DC
  Filled 2011-05-11 (×3): qty 1

## 2011-05-11 MED ORDER — ATORVASTATIN CALCIUM 40 MG PO TABS
40.0000 mg | ORAL_TABLET | Freq: Every day | ORAL | Status: DC
  Filled 2011-05-11 (×2): qty 1

## 2011-05-11 MED ORDER — HYDROMORPHONE 0.3 MG/ML IV SOLN
INTRAVENOUS | Status: AC
  Filled 2011-05-11: qty 25

## 2011-05-11 MED ORDER — LOSARTAN POTASSIUM 50 MG PO TABS
50.0000 mg | ORAL_TABLET | Freq: Every morning | ORAL | Status: DC
  Administered 2011-05-12: 50 mg via ORAL
  Filled 2011-05-11 (×3): qty 1

## 2011-05-11 MED ORDER — INSULIN ASPART 100 UNIT/ML ~~LOC~~ SOLN
0.0000 [IU] | SUBCUTANEOUS | Status: DC
  Administered 2011-05-11: 3 [IU] via SUBCUTANEOUS
  Administered 2011-05-11: 2 [IU] via SUBCUTANEOUS
  Administered 2011-05-11 – 2011-05-12 (×3): 5 [IU] via SUBCUTANEOUS

## 2011-05-11 MED ORDER — MEPERIDINE HCL 25 MG/ML IJ SOLN: 6.2500 mg | INTRAMUSCULAR | Status: DC | PRN

## 2011-05-11 MED ORDER — LIDOCAINE HCL (CARDIAC) 20 MG/ML IV SOLN
INTRAVENOUS | Status: DC | PRN
  Administered 2011-05-11: 50 mg via INTRAVENOUS

## 2011-05-11 MED ORDER — MENTHOL 3 MG MT LOZG: 1.0000 | LOZENGE | OROMUCOSAL | Status: DC | PRN

## 2011-05-11 MED ORDER — DEXTROSE-NACL 5-0.45 % IV SOLN
INTRAVENOUS | Status: DC
  Administered 2011-05-11 – 2011-05-12 (×2): via INTRAVENOUS

## 2011-05-11 MED ORDER — DOCUSATE SODIUM 100 MG PO CAPS
100.0000 mg | ORAL_CAPSULE | Freq: Two times a day (BID) | ORAL | Status: DC
  Administered 2011-05-11 – 2011-05-12 (×2): 100 mg via ORAL
  Filled 2011-05-11 (×2): qty 1

## 2011-05-11 MED ORDER — NALOXONE HCL 0.4 MG/ML IJ SOLN: 0.4000 mg | INTRAMUSCULAR | Status: DC | PRN

## 2011-05-11 MED ORDER — ONDANSETRON HCL 4 MG/2ML IJ SOLN
INTRAMUSCULAR | Status: DC | PRN
  Administered 2011-05-11: 4 mg via INTRAVENOUS

## 2011-05-11 MED ORDER — OXYCODONE-ACETAMINOPHEN 5-325 MG PO TABS: 1.0000 | ORAL_TABLET | ORAL | Status: DC | PRN

## 2011-05-11 MED ORDER — CEFAZOLIN SODIUM 1-5 GM-% IV SOLN
1.0000 g | INTRAVENOUS | Status: AC
  Administered 2011-05-11: 1 g via INTRAVENOUS

## 2011-05-11 MED ORDER — DIPHENHYDRAMINE HCL 50 MG/ML IJ SOLN: 12.5000 mg | Freq: Four times a day (QID) | INTRAMUSCULAR | Status: DC | PRN

## 2011-05-11 MED ORDER — PROPOFOL 10 MG/ML IV EMUL
INTRAVENOUS | Status: AC
  Filled 2011-05-11: qty 20

## 2011-05-11 MED ORDER — LIDOCAINE HCL (CARDIAC) 20 MG/ML IV SOLN
INTRAVENOUS | Status: AC
  Filled 2011-05-11: qty 5

## 2011-05-11 MED ORDER — ONDANSETRON HCL 4 MG PO TABS: 4.0000 mg | ORAL_TABLET | Freq: Four times a day (QID) | ORAL | Status: DC | PRN

## 2011-05-11 MED ORDER — HYDROMORPHONE 0.3 MG/ML IV SOLN
INTRAVENOUS | Status: DC
  Administered 2011-05-11: 11:00:00 via INTRAVENOUS
  Administered 2011-05-11: 1.19 mg via INTRAVENOUS

## 2011-05-11 MED ORDER — ONDANSETRON HCL 4 MG/2ML IJ SOLN
INTRAMUSCULAR | Status: AC
  Filled 2011-05-11: qty 2

## 2011-05-11 MED ORDER — PROMETHAZINE HCL 25 MG/ML IJ SOLN: 6.2500 mg | INTRAMUSCULAR | Status: DC | PRN

## 2011-05-11 MED ORDER — MIDAZOLAM HCL 2 MG/2ML IJ SOLN
INTRAMUSCULAR | Status: AC
  Filled 2011-05-11: qty 2

## 2011-05-11 MED ORDER — KETOROLAC TROMETHAMINE 30 MG/ML IJ SOLN
INTRAMUSCULAR | Status: AC
  Administered 2011-05-11: 30 mg via INTRAVENOUS
  Filled 2011-05-11: qty 1

## 2011-05-11 SURGICAL SUPPLY — 28 items
BLADE SURG 15 STRL LF C SS BP (BLADE) IMPLANT
BLADE SURG 15 STRL SS (BLADE)
CLOTH BEACON ORANGE TIMEOUT ST (SAFETY) ×2 IMPLANT
CONT PATH 16OZ SNAP LID 3702 (MISCELLANEOUS) IMPLANT
DECANTER SPIKE VIAL GLASS SM (MISCELLANEOUS) IMPLANT
DRAPE HYSTEROSCOPY (DRAPE) ×2 IMPLANT
GAUZE PACKING 2X5 YD STERILE (GAUZE/BANDAGES/DRESSINGS) ×2 IMPLANT
GLOVE BIO SURGEON STRL SZ8 (GLOVE) ×2 IMPLANT
GLOVE BIOGEL PI IND STRL 6.5 (GLOVE) ×1 IMPLANT
GLOVE BIOGEL PI INDICATOR 6.5 (GLOVE) ×1
GLOVE ORTHO TXT STRL SZ7.5 (GLOVE) ×2 IMPLANT
GOWN PREVENTION PLUS LG XLONG (DISPOSABLE) ×6 IMPLANT
NEEDLE HYPO 22GX1.5 SAFETY (NEEDLE) IMPLANT
NEEDLE MAYO .5 CIRCLE (NEEDLE) IMPLANT
NS IRRIG 1000ML POUR BTL (IV SOLUTION) ×2 IMPLANT
PACK VAGINAL WOMENS (CUSTOM PROCEDURE TRAY) ×2 IMPLANT
SUT PROLENE 1 CTX 30  8455H (SUTURE)
SUT PROLENE 1 CTX 30 8455H (SUTURE) IMPLANT
SUT SILK 0 SH 30 (SUTURE) ×2 IMPLANT
SUT VIC AB 2-0 CT2 27 (SUTURE) ×8 IMPLANT
SUT VIC AB 3-0 CT1 27 (SUTURE)
SUT VIC AB 3-0 CT1 TAPERPNT 27 (SUTURE) IMPLANT
SUT VIC AB 3-0 SH 27 (SUTURE)
SUT VIC AB 3-0 SH 27X BRD (SUTURE) IMPLANT
SUT VICRYL 0 UR6 27IN ABS (SUTURE) IMPLANT
TOWEL OR 17X24 6PK STRL BLUE (TOWEL DISPOSABLE) ×4 IMPLANT
TRAY FOLEY CATH 14FR (SET/KITS/TRAYS/PACK) ×2 IMPLANT
WATER STERILE IRR 1000ML POUR (IV SOLUTION) ×2 IMPLANT

## 2011-05-11 NOTE — Progress Notes (Signed)
Post-op check Doing well, some nausea, pain ok Afeb, VSS, clear urine, CBG 125-200 Abd- soft Continue current care

## 2011-05-11 NOTE — Anesthesia Postprocedure Evaluation (Signed)
  Anesthesia Post-op Note  Patient: Valerie Wood  Procedure(s) Performed: Procedure(s) (LRB): POSTERIOR REPAIR (RECTOCELE) (N/A)  Patient Location: [facility]  Anesthesia Type: General  Level of Consciousness: awake, alert  and oriented  Airway and Oxygen Therapy: Patient Spontanous Breathing  Post-op Pain: none  Post-op Assessment: Post-op Vital signs reviewed and Patient's Cardiovascular Status Stable  Post-op Vital Signs: Reviewed and stable  Complications: No apparent anesthesia complications

## 2011-05-11 NOTE — Transfer of Care (Signed)
Immediate Anesthesia Transfer of Care Note  Patient: Valerie Wood  Procedure(s) Performed: Procedure(s) (LRB): POSTERIOR REPAIR (RECTOCELE) (N/A)  Patient Location: PACU  Anesthesia Type: General  Level of Consciousness: sedated  Airway & Oxygen Therapy: Patient Spontanous Breathing and Patient connected to nasal cannula oxygen  Post-op Assessment: Report given to PACU RN  Post vital signs: Reviewed and stable  Complications: No apparent anesthesia complications

## 2011-05-11 NOTE — Addendum Note (Signed)
Addendum  created 05/11/11 1931 by Christene Lye, CRNA   Modules edited:Notes Section

## 2011-05-11 NOTE — Op Note (Signed)
Preoperative diagnosis: Rectocele and small cystocele Postoperative diagnosis: Grade 2-3 rectocele with a significant enterocele, minimal cystocele Procedure: Posterior colporrhaphy and repair of enterocele Surgeon: Lavina Hamman M.D. Assistant: Tracey Harries M.D. Anesthesia: Gen. With an LMA Findings: She had a grade 2-3 rectocele with a large enterocele. She had minimal cystocele. Estimated blood loss: 50 cc Complications: None  Procedure in detail: The patient was taken to the operating room and placed in the dorsosupine position. General anesthesia was induced. She was then placed in mobile stirrups. Perineum and vagina were then prepped and draped in the usual sterile fashion, bladder drained with a red Robinson catheter. A weighted speculum was inserted in the vagina. The anterior vagina was inspected and I did not feel there was enough cystocele to repair. The weighted speculum was removed and attention was turned posteriorly. The hymenal ring was grasped with Allis clamps bilaterally at a distance that when brought together would still allow 2 fingers to easily pass into the vagina. A piece of tissue was removed sharply. The posterior vaginal mucosa was then dissected in the midline to the vaginal apex. In doing so I encountered an enterocele about halfway up the posterior vagina. The enterocele was isolated from the vagina sharply and bluntly. The enterocele sac was entered. Edges of the sac were grasped with hemostats. I further dissected the enterocele sac off of the posterior vaginal mucosa and the rectocele. I then opened up the sac. The enterocele sac was then closed with one pursestring suture of 2-0 Vicryl with adequate closure and adequate reduction. Excess peritoneum of the sac was then removed sharply. An over glove was then placed and rectal exam was performed which confirmed a reduced enterocele and minimal remaining rectocele. The rectocele was then reduced with interrupted sutures  of 2-0 Vicryl with good reduction. Excess vaginal mucosa was then removed sharply. Vaginal mucosa was then closed in a running locking fashion from the apex to the hymenal ring with running locking 2-0 Vicryl. This achieved adequate closure and adequate hemostasis. Inspection revealed a good reduction. Rectal exam again confirmed good reduction of the rectocele and enterocele. I again inspected the vaginal mucosa and again did not feel there was a significant cystocele to repair. The vagina was packed with 2 inch gauze with estrogen cream. A Foley catheter was also inserted. The patient tolerated the procedure well. She was awakened in the operating room and taken to the recovery in stable condition. Counts were correct x2, she received Ancef 2 g IV at the beginning of the procedure, she had PAS hose on throughout the procedure.

## 2011-05-11 NOTE — Anesthesia Preprocedure Evaluation (Signed)
Anesthesia Evaluation    Reviewed: Allergy & Precautions, H&P , Patient's Chart, lab work & pertinent test results, reviewed documented beta blocker date and time   Airway Mallampati: II TM Distance: >3 FB Neck ROM: full    Dental No notable dental hx. (+) Teeth Intact   Pulmonary    Pulmonary exam normal       Cardiovascular hypertension, Pt. on medications     Neuro/Psych PSYCHIATRIC DISORDERS Anxiety negative neurological ROS     GI/Hepatic Neg liver ROS, GERD-  Medicated and Controlled,  Endo/Other  Diabetes mellitus-  Renal/GU negative Renal ROS     Musculoskeletal negative musculoskeletal ROS (+)   Abdominal Normal abdominal exam  (+)   Peds negative pediatric ROS (+)  Hematology negative hematology ROS (+)   Anesthesia Other Findings   Reproductive/Obstetrics negative OB ROS                           Anesthesia Physical Anesthesia Plan  ASA: II  Anesthesia Plan: General   Post-op Pain Management:    Induction: Intravenous  Airway Management Planned: LMA  Additional Equipment:   Intra-op Plan:   Post-operative Plan: Extubation in OR  Informed Consent: I have reviewed the patients History and Physical, chart, labs and discussed the procedure including the risks, benefits and alternatives for the proposed anesthesia with the patient or authorized representative who has indicated his/her understanding and acceptance.   Dental Advisory Given  Plan Discussed with: CRNA and Surgeon  Anesthesia Plan Comments: (Likely a type II Diabetic with FBS 248 today. Has never taken Insulin GERD is not an issue, therefore would use Proseal LMA)        Anesthesia Quick Evaluation

## 2011-05-11 NOTE — Anesthesia Postprocedure Evaluation (Signed)
  Anesthesia Post-op Note  Patient: Valerie Wood  Procedure(s) Performed: Procedure(s) (LRB): POSTERIOR REPAIR (RECTOCELE) (N/A)  Patient is awake and responsive. Pain and nausea are reasonably well controlled. Vital signs are stable and clinically acceptable. Oxygen saturation is clinically acceptable. There are no apparent anesthetic complications at this time. BS slightly higher....... We will RX BS on floor Patient is ready for discharge.

## 2011-05-11 NOTE — H&P (Signed)
Valerie Wood is an 66 y.o. female. She was seen in the offrice a little more than a month ago for an annual exam.  She complained of a vaginal bulge, is sexually active without problems.  She had a small cystocele and significant rectocele.  Options were discussed, she wants surgical repair.  Pertinent Gynecological History: Last mammogram: normal Date: Jan 2013 OB History: G2, P2002   Menstrual History: No LMP recorded.    Past Medical History  Diagnosis Date  . Hypertension   . Shortness of breath     quit smoking 12 weeks ago  . Anxiety   . GERD (gastroesophageal reflux disease)   . Diabetes mellitus     pt didn't divulge DM during PAT interview but Dr. Eligha Bridegroom nurse states that pt is type 2 diabetic, controlled by diet.  Pt states she has borderline Type II DM, it is not really addressed in her pre-op clearance by her PMD.  Elevated glucose on pre-op labs confirms diagnosis, random glucose 170 in our office on 4-3 Also has fibromyalgia and hypercholesterolemia.  Past Surgical History  Procedure Date  . Cholecystectomy   . Abdominal hysterectomy   . Appendectomy   . 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 She also reports having a midurethral sling.  No family history on file.  Social History:  reports that she quit smoking about 2 months ago. Her smoking use included Cigarettes. She smoked 1 pack per day. She does not have any smokeless tobacco history on file. She reports that she does not drink alcohol or use illicit drugs.  Allergies:  Allergies  Allergen Reactions  . Morphine And Related Nausea And Vomiting  . Tetracyclines & Related Itching and Nausea And Vomiting    Prescriptions prior to admission  Medication Sig Dispense Refill  . atorvastatin (LIPITOR) 40 MG tablet Take 40 mg by mouth every morning.      . busPIRone (BUSPAR) 5 MG tablet Take 5 mg by mouth 2 (two) times daily.      . Calcium Carbonate-Vit D-Min (CALTRATE 600+D  PLUS) 600-400 MG-UNIT per tablet Take 1 tablet by mouth at bedtime.       . dimenhyDRINATE (DRAMAMINE) 50 MG tablet Take 50 mg by mouth daily as needed. For vertigo. Takes about 3 times weekly.      Marland Kitchen losartan (COZAAR) 50 MG tablet Take 50 mg by mouth every morning.       Marland Kitchen omeprazole (PRILOSEC) 20 MG capsule Take 20 mg by mouth every morning.         Review of Systems  Respiratory: Negative.   Cardiovascular: Negative.   Gastrointestinal: Negative.   Genitourinary: Positive for urgency.    Blood pressure 133/80, pulse 85, temperature 97.3 F (36.3 C), temperature source Oral, resp. rate 18, SpO2 97.00%. Physical Exam  Constitutional: She appears well-developed and well-nourished.  Neck: Neck supple. No thyromegaly present.  Cardiovascular: Normal rate, regular rhythm and normal heart sounds.   No murmur heard. Respiratory: Effort normal and breath sounds normal. No respiratory distress.  GI: Soft. She exhibits no distension and no mass. There is no tenderness.  Genitourinary:       EGBUS no lesion Vagina with Gr I cystocele, Gr II-III rectocele No pelvic or adnexal mass    Results for orders placed during the hospital encounter of 05/11/11 (from the past 24 hour(s))  GLUCOSE, CAPILLARY     Status: Abnormal   Collection Time  05/11/11  6:39 AM      Component Value Range   Glucose-Capillary 248 (*) 70 - 99 (mg/dL)   Comment 1 Call MD NNP PA CNM      No results found.  Assessment/Plan: Symptomatic rectocele and cystocele.  All medical and surgical options have been discussed, including a pessary, she wants surgical therapy.  She has DM which is not optimally controlled, but not too bad that she cannot have surgery.  Will admit for A&P repair.  , D 05/11/2011, 7:03 AM

## 2011-05-11 NOTE — Interval H&P Note (Signed)
History and Physical Interval Note:  05/11/2011 7:16 AM  Valerie Wood  has presented today for surgery, with the diagnosis of Pelvic Relaxation Due to Rectocele  The various methods of treatment have been discussed with the patient and family. After consideration of risks, benefits and other options for treatment, the patient has consented to  Procedure(s) (LRB): ANTERIOR (CYSTOCELE) AND POSTERIOR REPAIR (RECTOCELE) (N/A) as a surgical intervention .  The patients' history has been reviewed, patient examined, no change in status, stable for surgery.  I have reviewed the patients' chart and labs.  Questions were answered to the patient's satisfaction.     , D

## 2011-05-12 DIAGNOSIS — K469 Unspecified abdominal hernia without obstruction or gangrene: Secondary | ICD-10-CM | POA: Diagnosis present

## 2011-05-12 DIAGNOSIS — N816 Rectocele: Secondary | ICD-10-CM | POA: Diagnosis present

## 2011-05-12 LAB — CBC
HCT: 34.6 % — ABNORMAL LOW (ref 36.0–46.0)
Hemoglobin: 11.6 g/dL — ABNORMAL LOW (ref 12.0–15.0)
RBC: 3.86 MIL/uL — ABNORMAL LOW (ref 3.87–5.11)
WBC: 8.5 10*3/uL (ref 4.0–10.5)

## 2011-05-12 LAB — GLUCOSE, CAPILLARY
Glucose-Capillary: 155 mg/dL — ABNORMAL HIGH (ref 70–99)
Glucose-Capillary: 233 mg/dL — ABNORMAL HIGH (ref 70–99)
Glucose-Capillary: 245 mg/dL — ABNORMAL HIGH (ref 70–99)

## 2011-05-12 MED ORDER — OXYCODONE-ACETAMINOPHEN 5-325 MG PO TABS: 1.0000 | ORAL_TABLET | ORAL | Status: AC | PRN

## 2011-05-12 NOTE — Progress Notes (Signed)
UR Chart review completed.  

## 2011-05-12 NOTE — Discharge Instructions (Signed)
Call for heavy bleeding, increasing pain, temp of >100.4

## 2011-05-12 NOTE — Progress Notes (Signed)
POD #1 Rectocele/enterocele repair Feels ok, still some nausea, pain ok Afeb, VSS, CBG 150-250 Abd- soft Vaginal packing removed- mod stain, foley removed Hgb 13.8 to 11.6 Doing well, will d/c home if tolerates PO well and ambulates

## 2011-05-14 NOTE — Discharge Summary (Signed)
Physician Discharge Summary  Patient ID: Valerie Wood MRN: [id number] DOB/AGE: 66/17/1947 66 y.o.  Admit date: 05/11/2011 Discharge date: 05/12/2011  Admission Diagnoses:  Symptomatic rectocele, diabetes  Discharge Diagnoses:  Symptomatic rectocele and enterocele, diabetes Active Problems:  Rectocele  Enterocele   Discharged Condition: good  Hospital Course: Admitted on 4-4, had posterior colporrhaphy and enterocele repair without problems.  No post-op complications, stable for discharge on 4-5.  Pre-op sugar above 200, post-op sugars 140-250 on SSI.  Consults: None  Treatments: surgery: Posterior colporrhaphy and enterocele repair  Discharge Exam: Blood pressure 135/87, pulse 77, temperature 98.5 F (36.9 C), temperature source Oral, resp. rate 18, height 5' 2.5" (1.588 m), weight 73.483 kg (162 lb), SpO2 93.00%. General appearance: alert  Disposition: 01-Home or Self Care  Discharge Orders    Future Orders Please Complete By Expires   Diet Carb Modified      Increase activity slowly      Lifting restrictions      Comments:   Do not lift more than 10 lbs   Sexual Activity Restrictions      Comments:   Nothing in vagina until after post-op exam in 6 weeks     Medication List  As of 05/14/2011 10:10 AM   TAKE these medications         atorvastatin 40 MG tablet   Commonly known as: LIPITOR   Take 40 mg by mouth every morning.      busPIRone 5 MG tablet   Commonly known as: BUSPAR   Take 5 mg by mouth 2 (two) times daily.      CALTRATE 600+D PLUS 600-400 MG-UNIT per tablet   Take 1 tablet by mouth at bedtime.      dimenhyDRINATE 50 MG tablet   Commonly known as: DRAMAMINE   Take 50 mg by mouth daily as needed. For vertigo. Takes about 3 times weekly.      losartan 50 MG tablet   Commonly known as: COZAAR   Take 50 mg by mouth every morning.      omeprazole 20 MG capsule   Commonly known as: PRILOSEC   Take 20 mg by mouth every morning.     oxyCODONE-acetaminophen 5-325 MG per tablet   Commonly known as: PERCOCET   Take 1-2 tablets by mouth every 3 (three) hours as needed (moderate to severe pain (when tolerating fluids)).           Follow-up Information    Follow up with , D, MD. Schedule an appointment as soon as possible for a visit in 6 weeks. (Make appt with PMD ASAP to address diabetes)    Contact information:   [address] [place] [address] [phone]          Signed: Zenaida Niece 05/14/2011, 10:10 AM

## 2011-05-15 ENCOUNTER — Encounter (HOSPITAL_COMMUNITY): Payer: Self-pay | Admitting: Obstetrics and Gynecology

## 2011-07-05 ENCOUNTER — Encounter: Payer: Self-pay | Admitting: Internal Medicine

## 2011-07-28 ENCOUNTER — Other Ambulatory Visit: Payer: Self-pay | Admitting: Family Medicine

## 2011-07-28 DIAGNOSIS — E041 Nontoxic single thyroid nodule: Secondary | ICD-10-CM

## 2011-08-03 ENCOUNTER — Ambulatory Visit (INDEPENDENT_AMBULATORY_CARE_PROVIDER_SITE_OTHER): Payer: Medicare Other | Admitting: Internal Medicine

## 2011-08-03 ENCOUNTER — Encounter: Payer: Self-pay | Admitting: Internal Medicine

## 2011-08-03 VITALS — BP 150/86 | HR 80 | Ht 63.0 in | Wt 158.6 lb

## 2011-08-03 DIAGNOSIS — F419 Anxiety disorder, unspecified: Secondary | ICD-10-CM | POA: Insufficient documentation

## 2011-08-03 DIAGNOSIS — F411 Generalized anxiety disorder: Secondary | ICD-10-CM

## 2011-08-03 DIAGNOSIS — K219 Gastro-esophageal reflux disease without esophagitis: Secondary | ICD-10-CM | POA: Insufficient documentation

## 2011-08-03 DIAGNOSIS — K3184 Gastroparesis: Secondary | ICD-10-CM

## 2011-08-03 MED ORDER — PANTOPRAZOLE SODIUM 40 MG PO TBEC: 40.0000 mg | DELAYED_RELEASE_TABLET | Freq: Every day | ORAL | Status: DC

## 2011-08-03 MED ORDER — SUCRALFATE 1 G PO TABS: 1.0000 g | ORAL_TABLET | Freq: Four times a day (QID) | ORAL | Status: DC

## 2011-08-03 MED ORDER — BUSPIRONE HCL 10 MG PO TABS: 10.0000 mg | ORAL_TABLET | Freq: Two times a day (BID) | ORAL | Status: DC

## 2011-08-03 NOTE — Patient Instructions (Addendum)
We have sent the following medications to your pharmacy for you to pick up at your convenience: Carafate , Buspar, and Pantoprazole.  If you are no better in 4-8 weeks make an appointment to come back and see Dr. Leone Payor.  Marland Kitchen

## 2011-08-03 NOTE — Progress Notes (Signed)
Subjective:    Patient ID: Valerie Wood, female    DOB: 25-Apr-1945, 66 y.o.   MRN: [id number]  HPI The patient is a pleasant 66 year old woman with a history of gastric paracystic and back to the early 1990s. She also has a history of gastric soft reflux disease and hiatal hernia. Her primary care physician, Dr. Edison Pace asked her to be evaluated for recurrent symptoms of epigastric pain and heartburn. For the past several months or so she has been having increasing problems with epigastric discomfort, heartburn and some regurgitation. She has tried to increase omeprazole to 20 mg twice a day but it is not really relieve things completely. She's had some intermittent chest pains and nausea but no vomiting. She does not endorse early satiety. Last upper endoscopy was in the mid 2000 with hiatal hernia and gastritis as well as esophageal stricture that was dilated and duodenitis. She has been on omeprazole for a long time.  More recently also, she has had hyperglycemia. She has had a visit with a diabetic educator and is trying to improve her diet. She has been stressed because her husband has parkinsonism dementia and she has to drive him everywhere now. She understands she has not been eating as well as she could have as well. However she does drink minimal if any caffeine, always carbonated beverages and has the head of her bed elevated and does not eat close to bedtime.  She also had a redo of a bladder prolapse surgery, several months ago, and is having intermittent lower quadrant abdominal pain and soreness. That is improving with time. Her bowel habits are not really effected. She has some occasional loose stools she relates to certain medicines. There has been no unintentional weight loss, dysphagia or bleeding.  She has been anxious, she tried BuSpar at 5 mg twice a day but took it for about a month. It is on her list but she's not really taking it currently is she did not feel that helped  though in the past that dose has helped her with anxiety, many years ago.  Allergies  Allergen Reactions  . Morphine And Related Nausea And Vomiting  . Tetracyclines & Related Itching and Nausea And Vomiting   Outpatient Prescriptions Prior to Visit  Medication Sig Dispense Refill  . atorvastatin (LIPITOR) 40 MG tablet Take 40 mg by mouth every morning.      . budesonide-formoterol (SYMBICORT) 160-4.5 MCG/ACT inhaler Inhale 2 puffs into the lungs 2 (two) times daily.      . Calcium Carbonate-Vit D-Min (CALTRATE 600+D PLUS) 600-400 MG-UNIT per tablet Take 2 tablets by mouth daily.       Marland Kitchen dimenhyDRINATE (DRAMAMINE) 50 MG tablet Take 50 mg by mouth daily as needed. For vertigo. Takes about 3 times weekly.      . fluticasone (FLONASE) 50 MCG/ACT nasal spray Place 2 sprays into the nose daily.      Marland Kitchen losartan (COZAAR) 50 MG tablet Take 50 mg by mouth every morning.       . meclizine (ANTIVERT) 25 MG tablet Take 25 mg by mouth. 1-2 tab bid prn      . OVER THE COUNTER MEDICATION FreeStyle Test Strips, & Control Solution,  Use as directed.      Marland Kitchen omeprazole (PRILOSEC) 20 MG capsule Take 20 mg by mouth 2 (two) times daily.       . busPIRone (BUSPAR) 5 MG tablet Take 5 mg by mouth 2 (two) times daily.  Past Medical History  Diagnosis Date  . Hypertension   . Shortness of breath     quit smoking 12 weeks ago  . Anxiety   . GERD (gastroesophageal reflux disease)   . Diabetes mellitus     pt didn't divulge DM during PAT interview but Dr. Eligha Bridegroom nurse states that pt is type 2 diabetic, controlled by diet.  . Gastroparesis due to DM   . Hyperlipidemia   . Fibromyalgia   . Thyroid nodule   . Seasonal allergies   . Uterine cancer 1989  . Migraine   . Tobacco dependence   . Osteopenia   . Stricture and stenosis of esophagus   . Duodenitis without mention of hemorrhage   . 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 Esophagitis, unspecified   . Hx of colonic polyps     Past Surgical History  Procedure Date  . Cholecystectomy   . Abdominal hysterectomy 12/1987    BSO  . Appendectomy   . Tonsillectomy   . Rectocele repair 05/11/2011    Procedure: POSTERIOR REPAIR (RECTOCELE);  Surgeon: Lavina Hamman, MD;  Location: [facility];  Service: Gynecology;  Laterality: N/A;  . Colonoscopy     multiple  . Esophagogastroduodenoscopy     multiple  . Tubal ligation   . Bladder surgery     Tack    History   Social History  . Marital Status: Married    Spouse Name: N/A    Number of Children: 2  .     Occupational History  . Retired     Chief Financial Officer    Social History Main Topics  . Smoking status: Former Smoker -- 1.0 packs/day    Types: Cigarettes    Quit date: 02/22/2011  . Smokeless tobacco: Never Used  . Alcohol Use: No  . Drug Use: No  . Sexually Active: None   Other Topics Concern  . None   Social History Narrative   Caffeine daily    Family History  Problem Relation Age of Onset  . Colon cancer Father   . Heart disease Mother     MI, CVA         Review of Systems This is positive for those things mentioned above. She also has some chronic intermittent headaches, vertigo which is not an active problem, excessive thirst, dyspnea, back pain, osteoarthritis, and allergies. All other review of systems negative or as per history of present illness.    Objective:   Physical Exam General:  Well-developed, well-nourished and in no acute distress she is overweight to obese Eyes:  anicteric. ENT:   Mouth and posterior pharynx free of lesions.  Neck:   supple w/o thyromegaly or mass.  Lungs: Clear to auscultation bilaterally. Heart:  S1S2, no rubs, murmurs, gallops. Abdomen:  There is a right upper quadrant cholecystectomy scar, there is an appendectomy scar and there is a low transverse scar from her bladder suspension surgeries. She is mildly tender over the low transverse scar and in the right upper quadrant. Bowel sounds are present,    there are no hernia, there is no organomegaly or mass. Lymph:  no cervical or supraclavicular adenopathy. Extremities:   no edema Skin   no rash. Neuro:  A&O x 3.  Psych:  Very talkative, anxious, somewhat hyperactive   Data Reviewed: Primary care office notes in the past few months previous endoscopy and colonoscopy reports.  Hemoglobin A1c 05/16/2011 is 9.6, it was 6.7 in December 2012. On 04/12/2011 white count  8.2 hemoglobin 14.5 platelets 228. TSH 2.68 02/08/2011. Except glucose 140       Assessment & Plan:   1. GERD (gastroesophageal reflux disease)   2. Gastroparesis   3. Anxiety    These problems are occurring in the setting of diabetes mellitus type 2 out of control. That will be an important factor to control. She does not really have vomiting suggestive of significant gastric varices symptoms right now though she could have an underlying diagnosis. She does describe having delayed gastric emptying study as measured by gastric emptying scan many years ago, though that result is not available to me today.  I think changing her proton pump inhibitor to pantoprazole 40 mg daily make sense.  Will also add Carafate 1 g 4 times a day  She will work on controlling her diabetes  BuSpar will be prescribed at 10 mg twice a day to see if that makes a difference  She was offerd a followup visit to be scheduled versus following up as needed she was not better, she will try lifestyle changes in the medication as mentioned, and see me if she is not improved significantly in one to 2 months.  I appreciate the opportunity to care for this patient.  CC: Lupita Raider, MD

## 2011-08-23 ENCOUNTER — Ambulatory Visit
Admission: RE | Admit: 2011-08-23 | Discharge: 2011-08-23 | Disposition: A | Discharge status: Home / Self Care | Payer: Medicare Other | Source: Ambulatory Visit | Attending: Family Medicine | Admitting: Family Medicine

## 2011-08-23 DIAGNOSIS — E041 Nontoxic single thyroid nodule: Secondary | ICD-10-CM

## 2012-03-26 ENCOUNTER — Encounter (HOSPITAL_COMMUNITY): Payer: Self-pay | Admitting: Emergency Medicine

## 2012-03-26 ENCOUNTER — Emergency Department (HOSPITAL_COMMUNITY)
Admission: EM | Admit: 2012-03-26 | Discharge: 2012-03-26 | Disposition: A | Discharge status: Home / Self Care | Payer: Medicare Other | Attending: Emergency Medicine | Admitting: Emergency Medicine

## 2012-03-26 ENCOUNTER — Emergency Department (HOSPITAL_COMMUNITY): Payer: Medicare Other

## 2012-03-26 DIAGNOSIS — M792 Neuralgia and neuritis, unspecified: Secondary | ICD-10-CM

## 2012-03-26 DIAGNOSIS — K209 Esophagitis, unspecified without bleeding: Secondary | ICD-10-CM | POA: Insufficient documentation

## 2012-03-26 DIAGNOSIS — K3184 Gastroparesis: Secondary | ICD-10-CM | POA: Insufficient documentation

## 2012-03-26 DIAGNOSIS — IMO0002 Reserved for concepts with insufficient information to code with codable children: Secondary | ICD-10-CM | POA: Insufficient documentation

## 2012-03-26 DIAGNOSIS — K219 Gastro-esophageal reflux disease without esophagitis: Secondary | ICD-10-CM | POA: Insufficient documentation

## 2012-03-26 DIAGNOSIS — Z79899 Other long term (current) drug therapy: Secondary | ICD-10-CM | POA: Insufficient documentation

## 2012-03-26 DIAGNOSIS — Z860100 Personal history of colon polyps, unspecified: Secondary | ICD-10-CM | POA: Insufficient documentation

## 2012-03-26 DIAGNOSIS — Z8739 Personal history of other diseases of the musculoskeletal system and connective tissue: Secondary | ICD-10-CM | POA: Insufficient documentation

## 2012-03-26 DIAGNOSIS — Z862 Personal history of diseases of the blood and blood-forming organs and certain disorders involving the immune mechanism: Secondary | ICD-10-CM | POA: Insufficient documentation

## 2012-03-26 DIAGNOSIS — Z8719 Personal history of other diseases of the digestive system: Secondary | ICD-10-CM | POA: Insufficient documentation

## 2012-03-26 DIAGNOSIS — R1013 Epigastric pain: Secondary | ICD-10-CM | POA: Insufficient documentation

## 2012-03-26 DIAGNOSIS — E1149 Type 2 diabetes mellitus with other diabetic neurological complication: Secondary | ICD-10-CM | POA: Insufficient documentation

## 2012-03-26 DIAGNOSIS — G43909 Migraine, unspecified, not intractable, without status migrainosus: Secondary | ICD-10-CM | POA: Insufficient documentation

## 2012-03-26 DIAGNOSIS — Z87891 Personal history of nicotine dependence: Secondary | ICD-10-CM | POA: Insufficient documentation

## 2012-03-26 DIAGNOSIS — Z8542 Personal history of malignant neoplasm of other parts of uterus: Secondary | ICD-10-CM | POA: Insufficient documentation

## 2012-03-26 DIAGNOSIS — E785 Hyperlipidemia, unspecified: Secondary | ICD-10-CM | POA: Insufficient documentation

## 2012-03-26 DIAGNOSIS — Z8639 Personal history of other endocrine, nutritional and metabolic disease: Secondary | ICD-10-CM | POA: Insufficient documentation

## 2012-03-26 DIAGNOSIS — Z8659 Personal history of other mental and behavioral disorders: Secondary | ICD-10-CM | POA: Insufficient documentation

## 2012-03-26 DIAGNOSIS — I10 Essential (primary) hypertension: Secondary | ICD-10-CM | POA: Insufficient documentation

## 2012-03-26 LAB — COMPREHENSIVE METABOLIC PANEL
ALT: 28 U/L (ref 0–35)
AST: 24 U/L (ref 0–37)
Albumin: 3.9 g/dL (ref 3.5–5.2)
Chloride: 103 mEq/L (ref 96–112)
Creatinine, Ser: 0.77 mg/dL (ref 0.50–1.10)
Potassium: 4.2 mEq/L (ref 3.5–5.1)
Sodium: 139 mEq/L (ref 135–145)
Total Bilirubin: 0.3 mg/dL (ref 0.3–1.2)

## 2012-03-26 LAB — CBC WITH DIFFERENTIAL/PLATELET
Basophils Absolute: 0 10*3/uL (ref 0.0–0.1)
Basophils Relative: 0 % (ref 0–1)
MCHC: 34.7 g/dL (ref 30.0–36.0)
Monocytes Absolute: 0.6 10*3/uL (ref 0.1–1.0)
Neutro Abs: 4.7 10*3/uL (ref 1.7–7.7)
Neutrophils Relative %: 60 % (ref 43–77)
Platelets: 245 10*3/uL (ref 150–400)
RDW: 13.4 % (ref 11.5–15.5)
WBC: 7.8 10*3/uL (ref 4.0–10.5)

## 2012-03-26 LAB — POCT I-STAT TROPONIN I

## 2012-03-26 MED ORDER — ONDANSETRON HCL 4 MG/2ML IJ SOLN
4.0000 mg | Freq: Once | INTRAMUSCULAR | Status: AC
  Administered 2012-03-26: 4 mg via INTRAVENOUS
  Filled 2012-03-26: qty 2

## 2012-03-26 MED ORDER — KETOROLAC TROMETHAMINE 30 MG/ML IJ SOLN
30.0000 mg | Freq: Once | INTRAMUSCULAR | Status: AC
  Administered 2012-03-26: 30 mg via INTRAVENOUS
  Filled 2012-03-26: qty 1

## 2012-03-26 MED ORDER — PROMETHAZINE HCL 25 MG/ML IJ SOLN
12.5000 mg | Freq: Once | INTRAMUSCULAR | Status: AC
  Administered 2012-03-26: 12.5 mg via INTRAVENOUS
  Filled 2012-03-26: qty 1

## 2012-03-26 MED ORDER — TRAMADOL HCL 50 MG PO TABS: 50.0000 mg | ORAL_TABLET | Freq: Four times a day (QID) | ORAL | Status: DC | PRN

## 2012-03-26 NOTE — ED Provider Notes (Signed)
History     CSN: [id number]  Arrival date & time 03/26/12  1705   First MD Initiated Contact with Patient 03/26/12 1823      Chief Complaint  Patient presents with  . Arm Pain    (Consider location/radiation/quality/duration/timing/severity/associated sxs/prior treatment) Patient is a 67 y.o. female presenting with abdominal pain. The history is provided by the patient (the pt states she has had some epigastric pain and nauseau and she has had some arm pain recently).  Abdominal Pain Pain location:  Epigastric Pain quality: aching   Pain radiates to:  Does not radiate Pain severity:  Mild Onset quality:  Gradual Timing:  Intermittent Progression:  Unchanged Chronicity:  New Context: not alcohol use   Relieved by:  Nothing Associated symptoms: no chest pain, no cough, no diarrhea, no fatigue and no hematuria     Past Medical History  Diagnosis Date  . Hypertension   . Shortness of breath     quit smoking 12 weeks ago  . Anxiety   . GERD (gastroesophageal reflux disease)   . Diabetes mellitus     pt didn't divulge DM during PAT interview but Dr. Eligha Bridegroom nurse states that pt is type 2 diabetic, controlled by diet.  . Gastroparesis due to DM   . Hyperlipidemia   . Fibromyalgia   . Thyroid nodule   . Seasonal allergies   . Uterine cancer 1989  . Migraine   . Tobacco dependence   . Osteopenia   . Stricture and stenosis of esophagus   . Duodenitis without mention of hemorrhage   . 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 Esophagitis, unspecified   . Hx of colonic polyps     Past Surgical History  Procedure Laterality Date  . Cholecystectomy    . Abdominal hysterectomy  12/1987    BSO  . Appendectomy    . Tonsillectomy    . Rectocele repair  05/11/2011    Procedure: POSTERIOR REPAIR (RECTOCELE);  Surgeon: Lavina Hamman, MD;  Location: [facility];  Service: Gynecology;  Laterality: N/A;  . Colonoscopy      multiple  . Esophagogastroduodenoscopy       multiple  . Tubal ligation    . Bladder surgery      Tack     Family History  Problem Relation Age of Onset  . Colon cancer Father   . Heart disease Mother     MI, CVA    History  Substance Use Topics  . Smoking status: Former Smoker -- 1.00 packs/day    Types: Cigarettes    Quit date: 02/22/2011  . Smokeless tobacco: Never Used  . Alcohol Use: No    OB History   Grav Para Term Preterm Abortions TAB SAB Ect Mult Living                  Review of Systems  Constitutional: Negative for fatigue.  HENT: Negative for congestion, sinus pressure and ear discharge.   Eyes: Negative for discharge.  Respiratory: Negative for cough.   Cardiovascular: Negative for chest pain.  Gastrointestinal: Positive for abdominal pain. Negative for diarrhea.  Genitourinary: Negative for frequency and hematuria.  Musculoskeletal: Negative for back pain.  Skin: Negative for rash.  Neurological: Negative for seizures and headaches.  Psychiatric/Behavioral: Negative for hallucinations.    Allergies  Morphine and related and Tetracyclines & related  Home Medications   Current Outpatient Rx  Name  Route  Sig  Dispense  Refill  . atorvastatin (LIPITOR)  40 MG tablet   Oral   Take 40 mg by mouth every morning.         . Liraglutide (VICTOZA) 18 MG/3ML SOLN injection   Subcutaneous   Inject 1.2 mg into the skin daily.         Marland Kitchen losartan (COZAAR) 50 MG tablet   Oral   Take 50 mg by mouth every morning.          . meclizine (ANTIVERT) 25 MG tablet   Oral   Take 25-50 mg by mouth 3 (three) times daily as needed for dizziness.          Marland Kitchen omeprazole (PRILOSEC) 20 MG capsule   Oral   Take 20 mg by mouth daily.         . SUMAtriptan (IMITREX) 100 MG tablet   Oral   Take 100 mg by mouth every 2 (two) hours as needed for migraine.         . traMADol (ULTRAM) 50 MG tablet   Oral   Take 1 tablet (50 mg total) by mouth every 6 (six) hours as needed for pain.   15 tablet    0     BP 146/77  Pulse 94  Temp(Src) 98.3 F (36.8 C) (Oral)  Resp 18  SpO2 93%  Physical Exam  Constitutional: She is oriented to person, place, and time. She appears well-developed.  HENT:  Head: Normocephalic and atraumatic.  Eyes: Conjunctivae and EOM are normal. No scleral icterus.  Neck: Neck supple. No thyromegaly present.  Cardiovascular: Normal rate and regular rhythm.  Exam reveals no gallop and no friction rub.   No murmur heard. Pulmonary/Chest: No stridor. She has no wheezes. She has no rales. She exhibits no tenderness.  Abdominal: She exhibits no distension. There is tenderness. There is no rebound.  Epigastric tender  Musculoskeletal: Normal range of motion. She exhibits no edema.  Lymphadenopathy:    She has no cervical adenopathy.  Neurological: She is oriented to person, place, and time. Coordination normal.  Skin: No rash noted. No erythema.  Psychiatric: She has a normal mood and affect. Her behavior is normal.    ED Course  Procedures (including critical care time)  Labs Reviewed  COMPREHENSIVE METABOLIC PANEL - Abnormal; Notable for the following:    Glucose, Bld 174 (*)    GFR calc non Af Amer 86 (*)    All other components within normal limits  CBC WITH DIFFERENTIAL  POCT I-STAT TROPONIN I   Dg Chest 2 View  03/26/2012  *RADIOLOGY REPORT*  Clinical Data: Chest pain, arm pain  CHEST - 2 VIEW  Comparison: Prior chest CT PE study 01/29/2011; prior chest x-ray 01/29/2011  Findings: The lungs are well-aerated and free from pulmonary edema, focal airspace consolidation or pulmonary nodule.  Mild pulmonary hyperexpansion and interstitial prominence are similar to prior.  Surgical clips in the right upper quadrant suggest prior cholecystectomy.  Cardiac and mediastinal contours are within normal limits.  No pneumothorax, or pleural effusion. No acute osseous findings.  IMPRESSION:  No acute cardiopulmonary disease.   Original Report Authenticated By: Malachy Moan, M.D.      1. Neuralgia   2. GERD (gastroesophageal reflux disease)       Date: 03/26/2012  Rate: 107  Rhythm: normal sinus rhythm  QRS Axis: normal  Intervals: normal  ST/T Wave abnormalities: nonspecific ST changes  Conduction Disutrbances:none  Narrative Interpretation:   Old EKG Reviewed: none available   MDM  The chart  was scribed for me under my direct supervision.  I personally performed the history, physical, and medical decision making and all procedures in the evaluation of this patient.Benny Lennert, MD 03/26/12 2002

## 2012-03-26 NOTE — ED Notes (Signed)
PT 88% on RA. Placed on 2L [place]; 96 %.

## 2012-03-26 NOTE — ED Notes (Signed)
Pt reports continued nausea with no relief with Zofran. MD Zammit made aware.

## 2012-03-26 NOTE — ED Notes (Signed)
Pt c/o left arm pain and slight pain in her mid chest.  Also c/o nausea without vomiting.  St's she was hypertensive at home

## 2012-03-26 NOTE — ED Notes (Signed)
Pt reports mild nausea at this time. VSS. Family at bedside.

## 2012-04-19 ENCOUNTER — Encounter: Payer: Self-pay | Admitting: Internal Medicine

## 2012-04-24 ENCOUNTER — Encounter: Payer: Self-pay | Admitting: Internal Medicine

## 2012-06-07 ENCOUNTER — Ambulatory Visit (AMBULATORY_SURGERY_CENTER): Payer: Medicare Other | Admitting: *Deleted

## 2012-06-07 VITALS — Ht 62.5 in | Wt 158.4 lb

## 2012-06-07 DIAGNOSIS — Z1211 Encounter for screening for malignant neoplasm of colon: Secondary | ICD-10-CM

## 2012-06-07 MED ORDER — NA SULFATE-K SULFATE-MG SULF 17.5-3.13-1.6 GM/177ML PO SOLN: ORAL | Status: DC

## 2012-06-21 ENCOUNTER — Encounter: Payer: Self-pay | Admitting: Internal Medicine

## 2012-06-21 ENCOUNTER — Ambulatory Visit (AMBULATORY_SURGERY_CENTER): Payer: Medicare Other | Admitting: Internal Medicine

## 2012-06-21 ENCOUNTER — Other Ambulatory Visit: Payer: Self-pay | Admitting: Internal Medicine

## 2012-06-21 VITALS — BP 109/57 | HR 70 | Temp 97.4°F | Resp 45 | Ht 62.0 in | Wt 158.0 lb

## 2012-06-21 DIAGNOSIS — K5730 Diverticulosis of large intestine without perforation or abscess without bleeding: Secondary | ICD-10-CM

## 2012-06-21 DIAGNOSIS — Z800 Family history of malignant neoplasm of digestive organs: Secondary | ICD-10-CM

## 2012-06-21 DIAGNOSIS — D126 Benign neoplasm of colon, unspecified: Secondary | ICD-10-CM

## 2012-06-21 DIAGNOSIS — Z86010 Personal history of colonic polyps: Secondary | ICD-10-CM

## 2012-06-21 DIAGNOSIS — Z1211 Encounter for screening for malignant neoplasm of colon: Secondary | ICD-10-CM

## 2012-06-21 MED ORDER — SODIUM CHLORIDE 0.9 % IV SOLN: 500.0000 mL | INTRAVENOUS | Status: DC

## 2012-06-21 NOTE — Progress Notes (Signed)
Called to room to assist during endoscopic procedure.  Patient ID and intended procedure confirmed with present staff. Received instructions for my participation in the procedure from the performing physician. ewm 

## 2012-06-21 NOTE — Op Note (Addendum)
[facility] [address] [place], [address]   COLONOSCOPY PROCEDURE REPORT  PATIENT: Valerie Wood, Valerie Wood  MR#: [id number] BIRTHDATE: 1945/09/13 , 66  yrs. old GENDER: Female ENDOSCOPIST: Iva Boop, MD, [facility] PROCEDURE DATE:  06/21/2012 PROCEDURE:   Colonoscopy with biopsy and snare polypectomy ASA CLASS:   Class II INDICATIONS:Screening and surveillance,personal history of colonic polyps.   + FHX elderly father w/ colon cancer MEDICATIONS: Propofol (Diprivan) 280 mg IV, MAC sedation, administered by CRNA, and These medications were titrated to patient response per physician's verbal order  DESCRIPTION OF PROCEDURE:   After the risks benefits and alternatives of the procedure were thoroughly explained, informed consent was obtained.  A digital rectal exam revealed no abnormalities of the rectum.   The LB [id number]  endoscope was introduced through the anus and advanced to the cecum, which was identified by both the appendix and ileocecal valve. No adverse events experienced.   The quality of the prep was excellent using Suprep  The instrument was then slowly withdrawn as the colon was fully examined.    COLON FINDINGS: Two sessile polyps measuring 3 and 5 mm in size were found in the ascending colon.  A polypectomy was performed with cold forceps and with a cold snare.  The resection was complete and the polyp tissue was completely retrieved.   Mild diverticulosis was noted in the sigmoid colon.   The colon mucosa was otherwise normal.  Retroflexed views revealed no abnormalities. The time to cecum=5 minutes 21 seconds.  Withdrawal time=8 minutes 58 seconds. The scope was withdrawn and the procedure completed. COMPLICATIONS: There were no complications.  ENDOSCOPIC IMPRESSION: 1.   Two sessile polyps measuring 3 and 5 mm in size were found in the ascending colon; polypectomy was performed with cold forceps and with a cold snare 2.   Mild  diverticulosis was noted in the sigmoid colon 3.   The colon mucosa was otherwise normal - excellent prep  RECOMMENDATIONS: 1.  Timing of repeat colonoscopy will be determined by pathology findings. 2.   Likely 5 years in patient w/ prior adenomas (2 in 2009) and prior polyps 1998, 2002.  Elderly father w/ colon cancer.   eSigned:  Iva Boop, MD, [facility] 06/21/2012 9:40 AM Revised: 06/21/2012 9:40 AM cc: The Patient    and Lupita Raider, MD

## 2012-06-21 NOTE — Patient Instructions (Addendum)
I found and removed two small polyps today. You also have rare diverticulosis.  I will let you know pathology results and when to have another routine colonoscopy by mail. I suspect it will it be in 5 years again.  I appreciate the opportunity to care for you.  Iva Boop, MD, [facility]  Discharge instructions given with verbal understanding. Handouts on polyps and diverticulosis given. Resume previous medications. YOU HAD AN ENDOSCOPIC PROCEDURE TODAY AT THE [facility]: Refer to the procedure report that was given to you for any specific questions about what was found during the examination.  If the procedure report does not answer your questions, please call your gastroenterologist to clarify.  If you requested that your care partner not be given the details of your procedure findings, then the procedure report has been included in a sealed envelope for you to review at your convenience later.  YOU SHOULD EXPECT: Some feelings of bloating in the abdomen. Passage of more gas than usual.  Walking can help get rid of the air that was put into your GI tract during the procedure and reduce the bloating. If you had a lower endoscopy (such as a colonoscopy or flexible sigmoidoscopy) you may notice spotting of blood in your stool or on the toilet paper. If you underwent a bowel prep for your procedure, then you may not have a normal bowel movement for a few days.  DIET: Your first meal following the procedure should be a light meal and then it is ok to progress to your normal diet.  A half-sandwich or bowl of soup is an example of a good first meal.  Heavy or fried foods are harder to digest and may make you feel nauseous or bloated.  Likewise meals heavy in dairy and vegetables can cause extra gas to form and this can also increase the bloating.  Drink plenty of fluids but you should avoid alcoholic beverages for 24 hours.  ACTIVITY: Your care partner should take you home directly after  the procedure.  You should plan to take it easy, moving slowly for the rest of the day.  You can resume normal activity the day after the procedure however you should NOT DRIVE or use heavy machinery for 24 hours (because of the sedation medicines used during the test).    SYMPTOMS TO REPORT IMMEDIATELY: A gastroenterologist can be reached at any hour.  During normal business hours, 8:30 AM to 5:00 PM Monday through Friday, call [phone].  After hours and on weekends, please call the GI answering service at [phone] who will take a message and have the physician on call contact you.   Following lower endoscopy (colonoscopy or flexible sigmoidoscopy):  Excessive amounts of blood in the stool  Significant tenderness or worsening of abdominal pains  Swelling of the abdomen that is new, acute  Fever of 100F or higher  FOLLOW UP: If any biopsies were taken you will be contacted by phone or by letter within the next 1-3 weeks.  Call your gastroenterologist if you have not heard about the biopsies in 3 weeks.  Our staff will call the home number listed on your records the next business day following your procedure to check on you and address any questions or concerns that you may have at that time regarding the information given to you following your procedure. This is a courtesy call and so if there is no answer at the home number and we have not heard from  you through the emergency physician on call, we will assume that you have returned to your regular daily activities without incident.  SIGNATURES/CONFIDENTIALITY: You and/or your care partner have signed paperwork which will be entered into your electronic medical record.  These signatures attest to the fact that that the information above on your After Visit Summary has been reviewed and is understood.  Full responsibility of the confidentiality of this discharge information lies with you and/or your care-partner.

## 2012-06-21 NOTE — Progress Notes (Signed)
Patient did not experience any of the following events: a burn prior to discharge; a fall within the facility; wrong site/side/patient/procedure/implant event; or a hospital transfer or hospital admission upon discharge from the facility. (G8907) Patient did not have preoperative order for IV antibiotic SSI prophylaxis. (G8918)  

## 2012-06-24 ENCOUNTER — Telehealth: Payer: Self-pay | Admitting: *Deleted

## 2012-06-24 NOTE — Telephone Encounter (Signed)
  Follow up Call-  Call back number 06/21/2012  Post procedure Call Back phone  # [phone]  Talk with pt. not her spouse  Permission to leave phone message Yes     Patient questions:  Do you have a fever, pain , or abdominal swelling? no Pain Score  0 *  Have you tolerated food without any problems? yes  Have you been able to return to your normal activities? yes  Do you have any questions about your discharge instructions: Diet   no Medications  no Follow up visit  no  Do you have questions or concerns about your Care? no  Actions: * If pain score is 4 or above: No action needed, pain <4.

## 2012-06-27 ENCOUNTER — Encounter: Payer: Self-pay | Admitting: Internal Medicine

## 2012-06-27 NOTE — Progress Notes (Signed)
Quick Note:  2 diminutive adenomas Repeat colon about 06/2017 ______

## 2012-07-26 ENCOUNTER — Other Ambulatory Visit: Payer: Self-pay | Admitting: Family Medicine

## 2012-07-26 DIAGNOSIS — E049 Nontoxic goiter, unspecified: Secondary | ICD-10-CM

## 2012-08-13 ENCOUNTER — Ambulatory Visit
Admission: RE | Admit: 2012-08-13 | Discharge: 2012-08-13 | Disposition: A | Discharge status: Home / Self Care | Payer: Medicare Other | Source: Ambulatory Visit | Attending: Family Medicine | Admitting: Family Medicine

## 2012-08-13 DIAGNOSIS — E049 Nontoxic goiter, unspecified: Secondary | ICD-10-CM

## 2013-08-20 ENCOUNTER — Other Ambulatory Visit: Payer: Self-pay | Admitting: Family Medicine

## 2013-08-20 DIAGNOSIS — E049 Nontoxic goiter, unspecified: Secondary | ICD-10-CM

## 2013-08-26 ENCOUNTER — Ambulatory Visit
Admission: RE | Admit: 2013-08-26 | Discharge: 2013-08-26 | Disposition: A | Discharge status: Home / Self Care | Payer: Commercial Managed Care - HMO | Source: Ambulatory Visit | Attending: Family Medicine | Admitting: Family Medicine

## 2013-08-26 DIAGNOSIS — E049 Nontoxic goiter, unspecified: Secondary | ICD-10-CM

## 2013-09-10 ENCOUNTER — Other Ambulatory Visit: Payer: Self-pay | Admitting: Family Medicine

## 2013-09-10 DIAGNOSIS — R1011 Right upper quadrant pain: Secondary | ICD-10-CM

## 2013-09-16 ENCOUNTER — Ambulatory Visit
Admission: RE | Admit: 2013-09-16 | Discharge: 2013-09-16 | Disposition: A | Discharge status: Home / Self Care | Payer: Commercial Managed Care - HMO | Source: Ambulatory Visit | Attending: Family Medicine | Admitting: Family Medicine

## 2013-09-16 DIAGNOSIS — R1011 Right upper quadrant pain: Secondary | ICD-10-CM

## 2013-12-19 ENCOUNTER — Emergency Department (HOSPITAL_BASED_OUTPATIENT_CLINIC_OR_DEPARTMENT_OTHER): Payer: Medicare HMO

## 2013-12-19 ENCOUNTER — Encounter (HOSPITAL_BASED_OUTPATIENT_CLINIC_OR_DEPARTMENT_OTHER): Payer: Self-pay

## 2013-12-19 ENCOUNTER — Inpatient Hospital Stay (HOSPITAL_BASED_OUTPATIENT_CLINIC_OR_DEPARTMENT_OTHER)
Admission: EM | Admit: 2013-12-19 | Discharge: 2013-12-23 | DRG: 184 | Disposition: A | Discharge status: Home Health | Payer: Medicare HMO | Attending: General Surgery | Admitting: General Surgery

## 2013-12-19 DIAGNOSIS — K3184 Gastroparesis: Secondary | ICD-10-CM | POA: Diagnosis present

## 2013-12-19 DIAGNOSIS — K219 Gastro-esophageal reflux disease without esophagitis: Secondary | ICD-10-CM | POA: Diagnosis present

## 2013-12-19 DIAGNOSIS — S2249XA Multiple fractures of ribs, unspecified side, initial encounter for closed fracture: Secondary | ICD-10-CM

## 2013-12-19 DIAGNOSIS — Z79899 Other long term (current) drug therapy: Secondary | ICD-10-CM | POA: Diagnosis not present

## 2013-12-19 DIAGNOSIS — S060X9A Concussion with loss of consciousness of unspecified duration, initial encounter: Secondary | ICD-10-CM | POA: Diagnosis present

## 2013-12-19 DIAGNOSIS — D62 Acute posthemorrhagic anemia: Secondary | ICD-10-CM | POA: Diagnosis not present

## 2013-12-19 DIAGNOSIS — W109XXA Fall (on) (from) unspecified stairs and steps, initial encounter: Secondary | ICD-10-CM | POA: Diagnosis present

## 2013-12-19 DIAGNOSIS — J9811 Atelectasis: Secondary | ICD-10-CM | POA: Diagnosis present

## 2013-12-19 DIAGNOSIS — E1143 Type 2 diabetes mellitus with diabetic autonomic (poly)neuropathy: Secondary | ICD-10-CM | POA: Diagnosis present

## 2013-12-19 DIAGNOSIS — S32009A Unspecified fracture of unspecified lumbar vertebra, initial encounter for closed fracture: Secondary | ICD-10-CM

## 2013-12-19 DIAGNOSIS — R42 Dizziness and giddiness: Secondary | ICD-10-CM | POA: Diagnosis present

## 2013-12-19 DIAGNOSIS — Z8542 Personal history of malignant neoplasm of other parts of uterus: Secondary | ICD-10-CM | POA: Diagnosis not present

## 2013-12-19 DIAGNOSIS — E785 Hyperlipidemia, unspecified: Secondary | ICD-10-CM | POA: Diagnosis present

## 2013-12-19 DIAGNOSIS — I10 Essential (primary) hypertension: Secondary | ICD-10-CM | POA: Diagnosis present

## 2013-12-19 DIAGNOSIS — Z7951 Long term (current) use of inhaled steroids: Secondary | ICD-10-CM | POA: Diagnosis not present

## 2013-12-19 DIAGNOSIS — S2239XA Fracture of one rib, unspecified side, initial encounter for closed fracture: Secondary | ICD-10-CM

## 2013-12-19 DIAGNOSIS — S2241XA Multiple fractures of ribs, right side, initial encounter for closed fracture: Principal | ICD-10-CM

## 2013-12-19 DIAGNOSIS — G43109 Migraine with aura, not intractable, without status migrainosus: Secondary | ICD-10-CM | POA: Diagnosis present

## 2013-12-19 DIAGNOSIS — S0003XA Contusion of scalp, initial encounter: Secondary | ICD-10-CM | POA: Diagnosis present

## 2013-12-19 DIAGNOSIS — Z87891 Personal history of nicotine dependence: Secondary | ICD-10-CM

## 2013-12-19 DIAGNOSIS — S32059A Unspecified fracture of fifth lumbar vertebra, initial encounter for closed fracture: Secondary | ICD-10-CM | POA: Diagnosis present

## 2013-12-19 DIAGNOSIS — G43909 Migraine, unspecified, not intractable, without status migrainosus: Secondary | ICD-10-CM | POA: Diagnosis present

## 2013-12-19 DIAGNOSIS — M545 Low back pain: Secondary | ICD-10-CM | POA: Diagnosis present

## 2013-12-19 DIAGNOSIS — M797 Fibromyalgia: Secondary | ICD-10-CM | POA: Diagnosis present

## 2013-12-19 DIAGNOSIS — S0990XA Unspecified injury of head, initial encounter: Secondary | ICD-10-CM

## 2013-12-19 DIAGNOSIS — W19XXXA Unspecified fall, initial encounter: Secondary | ICD-10-CM

## 2013-12-19 DIAGNOSIS — S32049A Unspecified fracture of fourth lumbar vertebra, initial encounter for closed fracture: Secondary | ICD-10-CM | POA: Diagnosis present

## 2013-12-19 DIAGNOSIS — S060XAA Concussion with loss of consciousness status unknown, initial encounter: Secondary | ICD-10-CM | POA: Diagnosis present

## 2013-12-19 LAB — CBC WITH DIFFERENTIAL/PLATELET
BASOS ABS: 0 10*3/uL (ref 0.0–0.1)
BASOS PCT: 0 % (ref 0–1)
EOS PCT: 1 % (ref 0–5)
Eosinophils Absolute: 0.1 10*3/uL (ref 0.0–0.7)
HEMATOCRIT: 39 % (ref 36.0–46.0)
HEMOGLOBIN: 13.2 g/dL (ref 12.0–15.0)
LYMPHS PCT: 27 % (ref 12–46)
Lymphs Abs: 2.9 10*3/uL (ref 0.7–4.0)
MCH: 29.9 pg (ref 26.0–34.0)
MCHC: 33.8 g/dL (ref 30.0–36.0)
MCV: 88.2 fL (ref 78.0–100.0)
MONO ABS: 0.8 10*3/uL (ref 0.1–1.0)
MONOS PCT: 7 % (ref 3–12)
Neutro Abs: 6.9 10*3/uL (ref 1.7–7.7)
Neutrophils Relative %: 65 % (ref 43–77)
Platelets: 258 10*3/uL (ref 150–400)
RBC: 4.42 MIL/uL (ref 3.87–5.11)
RDW: 13.3 % (ref 11.5–15.5)
WBC: 10.7 10*3/uL — ABNORMAL HIGH (ref 4.0–10.5)

## 2013-12-19 LAB — BASIC METABOLIC PANEL
Anion gap: 12 (ref 5–15)
BUN: 14 mg/dL (ref 6–23)
CALCIUM: 9.8 mg/dL (ref 8.4–10.5)
CO2: 25 meq/L (ref 19–32)
CREATININE: 0.7 mg/dL (ref 0.50–1.10)
Chloride: 99 mEq/L (ref 96–112)
GFR calc Af Amer: >90 mL/min (ref >90)
GFR calc non Af Amer: 87 mL/min — ABNORMAL LOW (ref >90)
GLUCOSE: 267 mg/dL — AB (ref 70–99)
Potassium: 3.9 mEq/L (ref 3.7–5.3)
Sodium: 136 mEq/L — ABNORMAL LOW (ref 137–147)

## 2013-12-19 LAB — GLUCOSE, CAPILLARY
Glucose-Capillary: 211 mg/dL — ABNORMAL HIGH (ref 70–99)
Glucose-Capillary: 194 mg/dL — ABNORMAL HIGH (ref 70–99)

## 2013-12-19 MED ORDER — MECLIZINE HCL 25 MG PO TABS
25.0000 mg | ORAL_TABLET | Freq: Three times a day (TID) | ORAL | Status: DC | PRN
  Administered 2013-12-20 (×2): 25 mg via ORAL
  Filled 2013-12-19 (×4): qty 2

## 2013-12-19 MED ORDER — PROMETHAZINE HCL 25 MG/ML IJ SOLN
12.5000 mg | Freq: Once | INTRAMUSCULAR | Status: AC
  Administered 2013-12-19: 12.5 mg via INTRAVENOUS

## 2013-12-19 MED ORDER — IOHEXOL 300 MG/ML  SOLN
100.0000 mL | Freq: Once | INTRAMUSCULAR | Status: AC | PRN
  Administered 2013-12-19: 100 mL via INTRAVENOUS

## 2013-12-19 MED ORDER — ONDANSETRON HCL 4 MG/2ML IJ SOLN
4.0000 mg | Freq: Four times a day (QID) | INTRAMUSCULAR | Status: DC | PRN
  Administered 2013-12-19 – 2013-12-22 (×5): 4 mg via INTRAVENOUS
  Filled 2013-12-19 (×5): qty 2

## 2013-12-19 MED ORDER — ATORVASTATIN CALCIUM 40 MG PO TABS
40.0000 mg | ORAL_TABLET | Freq: Every morning | ORAL | Status: DC
  Administered 2013-12-20 – 2013-12-23 (×4): 40 mg via ORAL
  Filled 2013-12-19 (×4): qty 1

## 2013-12-19 MED ORDER — INSULIN ASPART 100 UNIT/ML ~~LOC~~ SOLN
0.0000 [IU] | Freq: Three times a day (TID) | SUBCUTANEOUS | Status: DC
  Administered 2013-12-19 – 2013-12-21 (×6): 5 [IU] via SUBCUTANEOUS
  Administered 2013-12-21: 3 [IU] via SUBCUTANEOUS
  Administered 2013-12-22 (×2): 5 [IU] via SUBCUTANEOUS
  Administered 2013-12-22: 8 [IU] via SUBCUTANEOUS
  Administered 2013-12-23: 5 [IU] via SUBCUTANEOUS
  Administered 2013-12-23: 3 [IU] via SUBCUTANEOUS

## 2013-12-19 MED ORDER — HYDROMORPHONE HCL 1 MG/ML IJ SOLN: 0.5000 mg | INTRAMUSCULAR | Status: DC | PRN

## 2013-12-19 MED ORDER — BISACODYL 10 MG RE SUPP: 10.0000 mg | Freq: Every day | RECTAL | Status: DC | PRN

## 2013-12-19 MED ORDER — HYDROMORPHONE HCL 1 MG/ML IJ SOLN
0.5000 mg | INTRAMUSCULAR | Status: DC | PRN
  Administered 2013-12-19 – 2013-12-21 (×6): 1 mg via INTRAVENOUS
  Filled 2013-12-19 (×6): qty 1

## 2013-12-19 MED ORDER — SODIUM CHLORIDE 0.9 % IV SOLN
INTRAVENOUS | Status: AC
  Administered 2013-12-19: 15:00:00 via INTRAVENOUS

## 2013-12-19 MED ORDER — PANTOPRAZOLE SODIUM 40 MG PO TBEC
40.0000 mg | DELAYED_RELEASE_TABLET | Freq: Every day | ORAL | Status: DC
  Administered 2013-12-19 – 2013-12-23 (×5): 40 mg via ORAL
  Filled 2013-12-19 (×5): qty 1

## 2013-12-19 MED ORDER — ONDANSETRON HCL 4 MG/2ML IJ SOLN
4.0000 mg | Freq: Once | INTRAMUSCULAR | Status: AC
  Administered 2013-12-19: 4 mg via INTRAVENOUS

## 2013-12-19 MED ORDER — ONDANSETRON HCL 4 MG PO TABS
4.0000 mg | ORAL_TABLET | Freq: Four times a day (QID) | ORAL | Status: DC | PRN
  Administered 2013-12-23: 4 mg via ORAL
  Filled 2013-12-19: qty 1

## 2013-12-19 MED ORDER — PROMETHAZINE HCL 25 MG/ML IJ SOLN
INTRAMUSCULAR | Status: AC
  Filled 2013-12-19: qty 1

## 2013-12-19 MED ORDER — ONDANSETRON HCL 4 MG/2ML IJ SOLN
INTRAMUSCULAR | Status: AC
  Filled 2013-12-19: qty 2

## 2013-12-19 MED ORDER — FENTANYL CITRATE 0.05 MG/ML IJ SOLN
50.0000 ug | Freq: Once | INTRAMUSCULAR | Status: AC
  Administered 2013-12-19: 50 ug via INTRAVENOUS
  Filled 2013-12-19: qty 2

## 2013-12-19 MED ORDER — BUDESONIDE-FORMOTEROL FUMARATE 160-4.5 MCG/ACT IN AERO
2.0000 | INHALATION_SPRAY | Freq: Two times a day (BID) | RESPIRATORY_TRACT | Status: DC
  Administered 2013-12-20 – 2013-12-23 (×6): 2 via RESPIRATORY_TRACT
  Filled 2013-12-19 (×2): qty 6

## 2013-12-19 MED ORDER — CANAGLIFLOZIN 300 MG PO TABS
300.0000 mg | ORAL_TABLET | Freq: Every day | ORAL | Status: DC
Start: 2013-12-19 — End: 2013-12-19

## 2013-12-19 MED ORDER — ONDANSETRON HCL 4 MG/2ML IJ SOLN
4.0000 mg | Freq: Once | INTRAMUSCULAR | Status: AC
  Administered 2013-12-19: 4 mg via INTRAVENOUS
  Filled 2013-12-19: qty 2

## 2013-12-19 MED ORDER — SODIUM CHLORIDE 0.9 % IV SOLN
INTRAVENOUS | Status: DC
  Administered 2013-12-19 – 2013-12-22 (×3): via INTRAVENOUS

## 2013-12-19 MED ORDER — LOSARTAN POTASSIUM 50 MG PO TABS
50.0000 mg | ORAL_TABLET | Freq: Every morning | ORAL | Status: DC
  Administered 2013-12-20 – 2013-12-23 (×4): 50 mg via ORAL
  Filled 2013-12-19 (×4): qty 1

## 2013-12-19 MED ORDER — LIRAGLUTIDE 18 MG/3ML ~~LOC~~ SOLN: 1.2000 mg | Freq: Every day | SUBCUTANEOUS | Status: DC

## 2013-12-19 MED ORDER — ENOXAPARIN SODIUM 40 MG/0.4ML ~~LOC~~ SOLN
40.0000 mg | SUBCUTANEOUS | Status: DC
  Administered 2013-12-20 – 2013-12-23 (×4): 40 mg via SUBCUTANEOUS
  Filled 2013-12-19 (×4): qty 0.4

## 2013-12-19 MED ORDER — DOCUSATE SODIUM 100 MG PO CAPS
100.0000 mg | ORAL_CAPSULE | Freq: Two times a day (BID) | ORAL | Status: DC
  Administered 2013-12-19 – 2013-12-23 (×8): 100 mg via ORAL
  Filled 2013-12-19 (×8): qty 1

## 2013-12-19 MED ORDER — SUMATRIPTAN SUCCINATE 100 MG PO TABS
100.0000 mg | ORAL_TABLET | ORAL | Status: DC | PRN
  Administered 2013-12-22 – 2013-12-23 (×2): 100 mg via ORAL
  Filled 2013-12-19 (×4): qty 1

## 2013-12-19 NOTE — ED Notes (Signed)
Witnessed fall down 13 steps onto a concrete pad.  Pt does not recall fall, however friend states she was talking immediately after incident.

## 2013-12-19 NOTE — ED Provider Notes (Signed)
CSN: [id number]     Arrival date & time 12/19/13  1053 History  This chart was scribed for Ezequiel Essex, MD by Ludger Nutting, ED Scribe. This patient was seen in room MH12/MH12 and the patient's care was started 11:27 AM.    Chief Complaint  Patient presents with  . Fall   The history is provided by the patient. No language interpreter was used.     HPI Comments: Valerie Wood is a 68 y.o. female who presents to the Emergency Department complaining of an unwitnessed fall down 12 steps, striking her head and upper and back on concrete which occurred PTA. Patient complains of constant upper and lower back pain along with nausea. She takes a baby ASA daily but denies anti-coagulant use. She denies chest pain, abdominal pain, hip pain, numbness, weakness, shortness of breath.   Past Medical History  Diagnosis Date  . Hypertension   . Shortness of breath     quit smoking 12 weeks ago  . Anxiety   . GERD (gastroesophageal reflux disease)   . Diabetes mellitus     pt didn't divulge DM during PAT interview but Dr. Paulene Floor nurse states that pt is type 2 diabetic, controlled by diet.  . Gastroparesis due to DM   . Hyperlipidemia   . Fibromyalgia   . Thyroid nodule   . Seasonal allergies   . Migraine   . Tobacco dependence   . Osteopenia   . Stricture and stenosis of esophagus   . Duodenitis without mention of hemorrhage   . 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 Esophagitis, unspecified   . Hx of colonic polyps   . Uterine cancer 1989   Past Surgical History  Procedure Laterality Date  . Cholecystectomy    . Abdominal hysterectomy  12/1987    BSO  . Appendectomy    . Tonsillectomy    . Rectocele repair  05/11/2011    Procedure: POSTERIOR REPAIR (RECTOCELE);  Surgeon: Cheri Fowler, MD;  Location: [facility];  Service: Gynecology;  Laterality: N/A;  . Colonoscopy      multiple  . Esophagogastroduodenoscopy      multiple  . Tubal ligation    . Bladder surgery       Tack    Family History  Problem Relation Age of Onset  . Colon cancer Father 78  . Heart disease Mother     MI, CVA   History  Substance Use Topics  . Smoking status: Former Smoker -- 1.00 packs/day    Types: Cigarettes    Quit date: 02/22/2011  . Smokeless tobacco: Never Used  . Alcohol Use: No   OB History    No data available     Review of Systems  A complete 10 system review of systems was obtained and all systems are negative except as noted in the HPI and PMH.    Allergies  Morphine and related and Tetracyclines & related  Home Medications   Prior to Admission medications   Medication Sig Start Date End Date Taking? Authorizing Provider  atorvastatin (LIPITOR) 40 MG tablet Take 40 mg by mouth every morning.    Historical Provider, MD  budesonide-formoterol (SYMBICORT) 160-4.5 MCG/ACT inhaler Inhale 2 puffs into the lungs 2 (two) times daily.    Historical Provider, MD  Canagliflozin (INVOKANA) 300 MG TABS Take by mouth daily.    Historical Provider, MD  cetirizine (ZYRTEC) 10 MG tablet Take 10 mg by mouth daily.    Historical Provider, MD  Liraglutide (VICTOZA) 18 MG/3ML SOLN injection Inject 1.2 mg into the skin daily.    Historical Provider, MD  losartan (COZAAR) 50 MG tablet Take 50 mg by mouth every morning.     Historical Provider, MD  meclizine (ANTIVERT) 25 MG tablet Take 25-50 mg by mouth 3 (three) times daily as needed for dizziness.     Historical Provider, MD  omeprazole (PRILOSEC) 20 MG capsule Take 20 mg by mouth daily.    Historical Provider, MD  Psyllium (METAMUCIL PO) Take by mouth. Takes 4 to 6 tablets daily    Historical Provider, MD  psyllium (REGULOID) 0.52 G capsule Take 0.52 g by mouth daily.    Historical Provider, MD  SUMAtriptan (IMITREX) 100 MG tablet Take 100 mg by mouth every 2 (two) hours as needed for migraine.    Historical Provider, MD  traMADol (ULTRAM) 50 MG tablet Take 1 tablet (50 mg total) by mouth every 6 (six) hours as needed for  pain. 03/26/12   Maudry Diego, MD  Vitamin D, Ergocalciferol, (DRISDOL) 50000 UNITS CAPS Take 50,000 Units by mouth.    Historical Provider, MD   BP 115/67 mmHg  Pulse 68  Temp(Src) 97.7 F (36.5 C) (Oral)  Resp 14  Ht 5\' 2"  (1.575 m)  Wt 153 lb (69.4 kg)  BMI 27.98 kg/m2  SpO2 95% Physical Exam  Constitutional: She is oriented to person, place, and time. She appears well-developed and well-nourished. No distress.  HENT:  Head: Normocephalic.  Mouth/Throat: Oropharynx is clear and moist. No oropharyngeal exudate.  Large occipital hematoma without lacerations.   Eyes: Conjunctivae and EOM are normal. Pupils are equal, round, and reactive to light.  Neck: Neck supple.  No meningismus. No C spine tenderness. Paraspinal C spine tenderness  Cardiovascular: Normal rate, regular rhythm, normal heart sounds and intact distal pulses.   No murmur heard. Pulmonary/Chest: Effort normal and breath sounds normal. No respiratory distress. She exhibits tenderness.  TTP R lateral and posterior ribs.  No crepitance or ecchymosis.  Abdominal: Soft. There is no tenderness. There is no rebound and no guarding.  Musculoskeletal: Normal range of motion. She exhibits tenderness. She exhibits no edema.  Right paraspinal lumbar tenderness. No midline spine tenderness.  Tenderness to right upper back and scapula.  Left upper arm tenderness without deformity.   Neurological: She is alert and oriented to person, place, and time. No cranial nerve deficit. She exhibits normal muscle tone. Coordination normal.  CN 2-12 intact.  5/5 strength throughout  Skin: Skin is warm.  Psychiatric: She has a normal mood and affect. Her behavior is normal.  Nursing note and vitals reviewed.   ED Course  Procedures (including critical care time)  DIAGNOSTIC STUDIES: Oxygen Saturation is 100% on RA, normal by my interpretation.    COORDINATION OF CARE: 11:38 AM Will order imaging and labs. Discussed treatment plan  with pt at bedside and pt agreed to plan.   Labs Review Labs Reviewed  BASIC METABOLIC PANEL - Abnormal; Notable for the following:    Sodium 136 (*)    Glucose, Bld 267 (*)    GFR calc non Af Amer 87 (*)    All other components within normal limits  CBC WITH DIFFERENTIAL - Abnormal; Notable for the following:    WBC 10.7 (*)    All other components within normal limits    Imaging Review Dg Chest 2 View  12/19/2013   CLINICAL DATA:  [place] down 13 steps.  Severe right-sided back pain.  EXAM: CHEST  2 VIEW  COMPARISON:  Chest radiograph 03/26/2012. CT chest abdomen pelvis 12/19/2013  FINDINGS: Heart size is upper normal and stable. Mediastinal hilar contours within normal limits. The trachea is midline.  Two slightly displaced right-sided rib fractures are visible on plain radiography. Fractures are seen involving the posterior right fourth and sixth ribs.  No left-sided rib fracture is visualized. The clavicles are intact. There is scattered, streaky atelectasis at the left lung base. Negative for pneumothorax or definite airspace disease. No pleural effusion. Thoracic spine vertebral bodies appear normal in height and alignment.  IMPRESSION: Acute slightly displaced fractures of the right fourth and sixth ribs. Please also see the concurrent CT the chest abdomen pelvis.  Scattered atelectasis in the left lung base.   Electronically Signed   By: Curlene Dolphin M.D.   On: 12/19/2013 13:50   Ct Head Wo Contrast  12/19/2013   CLINICAL DATA:  [place] downstairs.  Hit head.  EXAM: CT HEAD WITHOUT CONTRAST  CT CERVICAL SPINE WITHOUT CONTRAST  TECHNIQUE: Multidetector CT imaging of the head and cervical spine was performed following the standard protocol without intravenous contrast. Multiplanar CT image reconstructions of the cervical spine were also generated.  COMPARISON:  None.  FINDINGS: CT HEAD FINDINGS  Stable age related cerebral atrophy, ventriculomegaly and periventricular white matter disease. No  extra-axial fluid collections are identified. No CT findings for acute hemispheric infarction or intracranial hemorrhage. No mass lesions. The brainstem and cerebellum are normal.  Large scalp hematoma noted at the right posterior vertex with laceration. No radiopaque foreign body. No underlying skull fracture.  CT CERVICAL SPINE FINDINGS  Normal alignment of the cervical vertebral bodies. Disc spaces and vertebral bodies are maintained. No acute fracture or abnormal prevertebral soft tissue swelling. The facets are normally aligned. The skullbase C1 and C1-2 articulations are maintained. The dens is normal. No large disc protrusions, spinal or foraminal stenosis. Scattered neck nodes are noted but no mass or overt adenopathy. The lung apices are clear.  IMPRESSION: Large right-sided scalp hematoma and laceration at the vertex but no underlying skull fracture. No acute intracranial findings.  Normal alignment and no acute cervical spine fracture.   Electronically Signed   By: Kalman Jewels M.D.   On: 12/19/2013 13:43   Ct Chest W Contrast  12/19/2013   CLINICAL DATA:  Witnessed fall down 13 steps onto concrete pad, severe right back pain, nausea/vomiting  EXAM: CT CHEST, ABDOMEN, AND PELVIS WITH CONTRAST  TECHNIQUE: Multidetector CT imaging of the chest, abdomen and pelvis was performed following the standard protocol during bolus administration of intravenous contrast.  CONTRAST:  125mL OMNIPAQUE IOHEXOL 300 MG/ML  SOLN  COMPARISON:  None.  FINDINGS: CT CHEST FINDINGS  No evidence of thoracic aortic injury or mediastinal hematoma.  Mild dependent atelectasis in the lingula and bilateral lower lobes. No suspicious pulmonary nodules. No pleural effusion or pneumothorax.  Visualized right thyroid is notable for a 1.6 cm posterior right thyroid nodule (series 2/ image 9).  Heart is top-normal in size. No pericardial effusion. Mild atherosclerotic calcifications of the aortic arch.  No suspicious mediastinal,  hilar, or axillary lymphadenopathy.  Mild degenerative changes of the thoracic spine.  Right anterolateral 3rd and 4th rib fractures (series 2/ images 24 and 29). Right posterolateral 4th through 6th rib fractures (series 2/ images 19, 22, and 25).  CT ABDOMEN AND PELVIS FINDINGS  Hepatobiliary: The liver is within normal limits.  Status post cholecystectomy. No intrahepatic ductal dilatation. Dilated common duct,  which smoothly tapers at the ampulla.  Pancreas: Within normal limits.  Spleen: Within normal limits.  Adrenals/Urinary Tract: Adrenal glands are unremarkable.  1.5 cm posterior interpolar left renal cyst (series 2/ 68). Right kidney is within normal limits. No hydronephrosis.  Bladder is within normal limits.  Stomach/Bowel: Stomach is notable for a small hiatal hernia.  No evidence of bowel obstruction.  Sigmoid diverticulosis, without associated inflammatory changes.  Vascular/Lymphatic: Atherosclerotic calcifications of the abdominal aorta and branch vessels.  No suspicious abdominopelvic lymphadenopathy.  Reproductive: Status post hysterectomy.  No adnexal masses.  Other: No abdominopelvic ascites.  No hemoperitoneum or free air.  Mild subcutaneous stranding in the left anterior abdominal wall (series 2/image 66).  Mild bruising in the right gluteal region (series 2/image 82). Mild bruising/ hematoma in the left gluteal region (series 2/image 76).  Musculoskeletal: Very mild degenerative changes at L3-4.  Fractures involving the right L3 and L4 transverse processes (series 2/ images 71 and 78).  IMPRESSION: Fractures involving the anterolateral right 3rd and 4th ribs.  Fractures involving the right posterolateral 4th-6th ribs.  Fractures involving the right L3 and L4 transverse processes.   Electronically Signed   By: Julian Hy M.D.   On: 12/19/2013 13:54   Ct Cervical Spine Wo Contrast  12/19/2013   CLINICAL DATA:  [place] downstairs.  Hit head.  EXAM: CT HEAD WITHOUT CONTRAST  CT CERVICAL  SPINE WITHOUT CONTRAST  TECHNIQUE: Multidetector CT imaging of the head and cervical spine was performed following the standard protocol without intravenous contrast. Multiplanar CT image reconstructions of the cervical spine were also generated.  COMPARISON:  None.  FINDINGS: CT HEAD FINDINGS  Stable age related cerebral atrophy, ventriculomegaly and periventricular white matter disease. No extra-axial fluid collections are identified. No CT findings for acute hemispheric infarction or intracranial hemorrhage. No mass lesions. The brainstem and cerebellum are normal.  Large scalp hematoma noted at the right posterior vertex with laceration. No radiopaque foreign body. No underlying skull fracture.  CT CERVICAL SPINE FINDINGS  Normal alignment of the cervical vertebral bodies. Disc spaces and vertebral bodies are maintained. No acute fracture or abnormal prevertebral soft tissue swelling. The facets are normally aligned. The skullbase C1 and C1-2 articulations are maintained. The dens is normal. No large disc protrusions, spinal or foraminal stenosis. Scattered neck nodes are noted but no mass or overt adenopathy. The lung apices are clear.  IMPRESSION: Large right-sided scalp hematoma and laceration at the vertex but no underlying skull fracture. No acute intracranial findings.  Normal alignment and no acute cervical spine fracture.   Electronically Signed   By: Kalman Jewels M.D.   On: 12/19/2013 13:43   Ct Abdomen Pelvis W Contrast  12/19/2013   CLINICAL DATA:  Witnessed fall down 13 steps onto concrete pad, severe right back pain, nausea/vomiting  EXAM: CT CHEST, ABDOMEN, AND PELVIS WITH CONTRAST  TECHNIQUE: Multidetector CT imaging of the chest, abdomen and pelvis was performed following the standard protocol during bolus administration of intravenous contrast.  CONTRAST:  149mL OMNIPAQUE IOHEXOL 300 MG/ML  SOLN  COMPARISON:  None.  FINDINGS: CT CHEST FINDINGS  No evidence of thoracic aortic injury or  mediastinal hematoma.  Mild dependent atelectasis in the lingula and bilateral lower lobes. No suspicious pulmonary nodules. No pleural effusion or pneumothorax.  Visualized right thyroid is notable for a 1.6 cm posterior right thyroid nodule (series 2/ image 9).  Heart is top-normal in size. No pericardial effusion. Mild atherosclerotic calcifications of the aortic arch.  No  suspicious mediastinal, hilar, or axillary lymphadenopathy.  Mild degenerative changes of the thoracic spine.  Right anterolateral 3rd and 4th rib fractures (series 2/ images 24 and 29). Right posterolateral 4th through 6th rib fractures (series 2/ images 19, 22, and 25).  CT ABDOMEN AND PELVIS FINDINGS  Hepatobiliary: The liver is within normal limits.  Status post cholecystectomy. No intrahepatic ductal dilatation. Dilated common duct, which smoothly tapers at the ampulla.  Pancreas: Within normal limits.  Spleen: Within normal limits.  Adrenals/Urinary Tract: Adrenal glands are unremarkable.  1.5 cm posterior interpolar left renal cyst (series 2/ 68). Right kidney is within normal limits. No hydronephrosis.  Bladder is within normal limits.  Stomach/Bowel: Stomach is notable for a small hiatal hernia.  No evidence of bowel obstruction.  Sigmoid diverticulosis, without associated inflammatory changes.  Vascular/Lymphatic: Atherosclerotic calcifications of the abdominal aorta and branch vessels.  No suspicious abdominopelvic lymphadenopathy.  Reproductive: Status post hysterectomy.  No adnexal masses.  Other: No abdominopelvic ascites.  No hemoperitoneum or free air.  Mild subcutaneous stranding in the left anterior abdominal wall (series 2/image 66).  Mild bruising in the right gluteal region (series 2/image 82). Mild bruising/ hematoma in the left gluteal region (series 2/image 76).  Musculoskeletal: Very mild degenerative changes at L3-4.  Fractures involving the right L3 and L4 transverse processes (series 2/ images 71 and 78).   IMPRESSION: Fractures involving the anterolateral right 3rd and 4th ribs.  Fractures involving the right posterolateral 4th-6th ribs.  Fractures involving the right L3 and L4 transverse processes.   Electronically Signed   By: Julian Hy M.D.   On: 12/19/2013 13:54   Dg Shoulder Left  12/19/2013   CLINICAL DATA:  Fall, left shoulder pain  EXAM: LEFT SHOULDER - 2+ VIEW  COMPARISON:  None.  FINDINGS: There is no evidence of fracture or dislocation. There is no evidence of arthropathy or other focal bone abnormality. Soft tissues are unremarkable. Axillary view is suboptimally positioned due to patient immobility. No gross evidence for dislocation on the Y-view.  IMPRESSION: Negative.   Electronically Signed   By: Conchita Paris M.D.   On: 12/19/2013 13:47     EKG Interpretation None      MDM   Final diagnoses:  Fall  Multiple rib fractures, right, closed, initial encounter  Head injury, initial encounter  Lumbar transverse process fracture, closed, initial encounter  fall down one flight of steps onto concrete. Positive loss of consciousness. Complaining of head, back and left shoulder pain. Vital stable. GCS 15. ABCs intact.  Tender to palpation right upper back no bruising or crepitance. Equal breath sounds. No hypoxia. Rib fractures evident on chest x-ray without pneumothorax.  Large occipital scalp hematoma. CT head negative intrapelvic pathology. CT C-spine negative.  Patient found to have multiple rib fractures on the right side with fractures of her lumbar transverse processes. Vitals remained stable in the ED. No pneumothorax.  Discussed with Dr. Hulen Skains of trauma who will admit patient to [facility]. He agrees with clearing C-spine if able.   I personally performed the services described in this documentation, which was scribed in my presence. The recorded information has been reviewed and is accurate.   Ezequiel Essex, MD 12/19/13 [phone]

## 2013-12-19 NOTE — ED Notes (Signed)
MD at bedside. 

## 2013-12-19 NOTE — H&P (Signed)
History   Valerie Wood is an 68 y.o. female.   Chief Complaint:  Chief Complaint  Patient presents with  . Fall    Fall This is a new problem. The current episode started today. The problem occurs rarely. The problem has been unchanged. Associated symptoms include chest pain, nausea and vomiting. The symptoms are aggravated by twisting. She has tried immobilization and oral narcotics for the symptoms. The treatment provided mild relief.    Past Medical History  Diagnosis Date  . Hypertension   . Shortness of breath     quit smoking 12 weeks ago  . Anxiety   . GERD (gastroesophageal reflux disease)   . Diabetes mellitus     pt didn't divulge DM during PAT interview but Dr. Paulene Floor nurse states that pt is type 2 diabetic, controlled by diet.  . Gastroparesis due to DM   . Hyperlipidemia   . Fibromyalgia   . Thyroid nodule   . Seasonal allergies   . Migraine   . Tobacco dependence   . Osteopenia   . Stricture and stenosis of esophagus   . Duodenitis without mention of hemorrhage   . 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 Esophagitis, unspecified   . Hx of colonic polyps   . Uterine cancer 1989    Past Surgical History  Procedure Laterality Date  . Cholecystectomy    . Abdominal hysterectomy  12/1987    BSO  . Appendectomy    . Tonsillectomy    . Rectocele repair  05/11/2011    Procedure: POSTERIOR REPAIR (RECTOCELE);  Surgeon: Cheri Fowler, MD;  Location: [facility];  Service: Gynecology;  Laterality: N/A;  . Colonoscopy      multiple  . Esophagogastroduodenoscopy      multiple  . Tubal ligation    . Bladder surgery      Tack     Family History  Problem Relation Age of Onset  . Colon cancer Father 66  . Heart disease Mother     MI, CVA   Social History:  reports that she quit smoking about 2 years ago. Her smoking use included Cigarettes. She smoked 1.00 pack per day. She has never used smokeless tobacco. She reports that she does not drink  alcohol or use illicit drugs.  Allergies   Allergies  Allergen Reactions  . Morphine And Related Nausea And Vomiting  . Tetracyclines & Related Itching and Nausea And Vomiting    Home Medications   Medications Prior to Admission  Medication Sig Dispense Refill  . atorvastatin (LIPITOR) 40 MG tablet Take 40 mg by mouth every morning.    . budesonide-formoterol (SYMBICORT) 160-4.5 MCG/ACT inhaler Inhale 2 puffs into the lungs 2 (two) times daily.    . Canagliflozin (INVOKANA) 300 MG TABS Take by mouth daily.    . cetirizine (ZYRTEC) 10 MG tablet Take 10 mg by mouth daily.    . Liraglutide (VICTOZA) 18 MG/3ML SOLN injection Inject 1.2 mg into the skin daily.    Marland Kitchen losartan (COZAAR) 50 MG tablet Take 50 mg by mouth every morning.     . meclizine (ANTIVERT) 25 MG tablet Take 25-50 mg by mouth 3 (three) times daily as needed for dizziness.     Marland Kitchen omeprazole (PRILOSEC) 20 MG capsule Take 20 mg by mouth daily.    . Psyllium (METAMUCIL PO) Take by mouth. Takes 4 to 6 tablets daily    . psyllium (REGULOID) 0.52 G capsule Take 0.52 g by mouth daily.    Marland Kitchen  SUMAtriptan (IMITREX) 100 MG tablet Take 100 mg by mouth every 2 (two) hours as needed for migraine.    . traMADol (ULTRAM) 50 MG tablet Take 1 tablet (50 mg total) by mouth every 6 (six) hours as needed for pain. 15 tablet 0  . Vitamin D, Ergocalciferol, (DRISDOL) 50000 UNITS CAPS Take 50,000 Units by mouth.      Trauma Course   Results for orders placed or performed during the hospital encounter of 12/19/13 (from the past 48 hour(s))  Basic metabolic panel     Status: Abnormal   Collection Time: 12/19/13 11:55 AM  Result Value Ref Range   Sodium 136 (L) 137 - 147 mEq/L   Potassium 3.9 3.7 - 5.3 mEq/L   Chloride 99 96 - 112 mEq/L   CO2 25 19 - 32 mEq/L   Glucose, Bld 267 (H) 70 - 99 mg/dL   BUN 14 6 - 23 mg/dL   Creatinine, Ser 0.70 0.50 - 1.10 mg/dL   Calcium 9.8 8.4 - 10.5 mg/dL   GFR calc non Af Amer 87 (L) >90 mL/min   GFR calc Af  Amer >90 >90 mL/min    Comment: (NOTE) The eGFR has been calculated using the CKD EPI equation. This calculation has not been validated in all clinical situations. eGFR's persistently <90 mL/min signify possible Chronic Kidney Disease.    Anion gap 12 5 - 15  CBC with Differential     Status: Abnormal   Collection Time: 12/19/13 11:55 AM  Result Value Ref Range   WBC 10.7 (H) 4.0 - 10.5 K/uL   RBC 4.42 3.87 - 5.11 MIL/uL   Hemoglobin 13.2 12.0 - 15.0 g/dL   HCT 39.0 36.0 - 46.0 %   MCV 88.2 78.0 - 100.0 fL   MCH 29.9 26.0 - 34.0 pg   MCHC 33.8 30.0 - 36.0 g/dL   RDW 13.3 11.5 - 15.5 %   Platelets 258 150 - 400 K/uL   Neutrophils Relative % 65 43 - 77 %   Neutro Abs 6.9 1.7 - 7.7 K/uL   Lymphocytes Relative 27 12 - 46 %   Lymphs Abs 2.9 0.7 - 4.0 K/uL   Monocytes Relative 7 3 - 12 %   Monocytes Absolute 0.8 0.1 - 1.0 K/uL   Eosinophils Relative 1 0 - 5 %   Eosinophils Absolute 0.1 0.0 - 0.7 K/uL   Basophils Relative 0 0 - 1 %   Basophils Absolute 0.0 0.0 - 0.1 K/uL   Dg Chest 2 View  12/19/2013   CLINICAL DATA:  [place] down 13 steps.  Severe right-sided back pain.  EXAM: CHEST  2 VIEW  COMPARISON:  Chest radiograph 03/26/2012. CT chest abdomen pelvis 12/19/2013  FINDINGS: Heart size is upper normal and stable. Mediastinal hilar contours within normal limits. The trachea is midline.  Two slightly displaced right-sided rib fractures are visible on plain radiography. Fractures are seen involving the posterior right fourth and sixth ribs.  No left-sided rib fracture is visualized. The clavicles are intact. There is scattered, streaky atelectasis at the left lung base. Negative for pneumothorax or definite airspace disease. No pleural effusion. Thoracic spine vertebral bodies appear normal in height and alignment.  IMPRESSION: Acute slightly displaced fractures of the right fourth and sixth ribs. Please also see the concurrent CT the chest abdomen pelvis.  Scattered atelectasis in the left  lung base.   Electronically Signed   By: Curlene Dolphin M.D.   On: 12/19/2013 13:50   Ct Head  Wo Contrast  12/19/2013   CLINICAL DATA:  [place] downstairs.  Hit head.  EXAM: CT HEAD WITHOUT CONTRAST  CT CERVICAL SPINE WITHOUT CONTRAST  TECHNIQUE: Multidetector CT imaging of the head and cervical spine was performed following the standard protocol without intravenous contrast. Multiplanar CT image reconstructions of the cervical spine were also generated.  COMPARISON:  None.  FINDINGS: CT HEAD FINDINGS  Stable age related cerebral atrophy, ventriculomegaly and periventricular white matter disease. No extra-axial fluid collections are identified. No CT findings for acute hemispheric infarction or intracranial hemorrhage. No mass lesions. The brainstem and cerebellum are normal.  Large scalp hematoma noted at the right posterior vertex with laceration. No radiopaque foreign body. No underlying skull fracture.  CT CERVICAL SPINE FINDINGS  Normal alignment of the cervical vertebral bodies. Disc spaces and vertebral bodies are maintained. No acute fracture or abnormal prevertebral soft tissue swelling. The facets are normally aligned. The skullbase C1 and C1-2 articulations are maintained. The dens is normal. No large disc protrusions, spinal or foraminal stenosis. Scattered neck nodes are noted but no mass or overt adenopathy. The lung apices are clear.  IMPRESSION: Large right-sided scalp hematoma and laceration at the vertex but no underlying skull fracture. No acute intracranial findings.  Normal alignment and no acute cervical spine fracture.   Electronically Signed   By: Kalman Jewels M.D.   On: 12/19/2013 13:43   Ct Chest W Contrast  12/19/2013   CLINICAL DATA:  Witnessed fall down 13 steps onto concrete pad, severe right back pain, nausea/vomiting  EXAM: CT CHEST, ABDOMEN, AND PELVIS WITH CONTRAST  TECHNIQUE: Multidetector CT imaging of the chest, abdomen and pelvis was performed following the standard  protocol during bolus administration of intravenous contrast.  CONTRAST:  147m OMNIPAQUE IOHEXOL 300 MG/ML  SOLN  COMPARISON:  None.  FINDINGS: CT CHEST FINDINGS  No evidence of thoracic aortic injury or mediastinal hematoma.  Mild dependent atelectasis in the lingula and bilateral lower lobes. No suspicious pulmonary nodules. No pleural effusion or pneumothorax.  Visualized right thyroid is notable for a 1.6 cm posterior right thyroid nodule (series 2/ image 9).  Heart is top-normal in size. No pericardial effusion. Mild atherosclerotic calcifications of the aortic arch.  No suspicious mediastinal, hilar, or axillary lymphadenopathy.  Mild degenerative changes of the thoracic spine.  Right anterolateral 3rd and 4th rib fractures (series 2/ images 24 and 29). Right posterolateral 4th through 6th rib fractures (series 2/ images 19, 22, and 25).  CT ABDOMEN AND PELVIS FINDINGS  Hepatobiliary: The liver is within normal limits.  Status post cholecystectomy. No intrahepatic ductal dilatation. Dilated common duct, which smoothly tapers at the ampulla.  Pancreas: Within normal limits.  Spleen: Within normal limits.  Adrenals/Urinary Tract: Adrenal glands are unremarkable.  1.5 cm posterior interpolar left renal cyst (series 2/ 68). Right kidney is within normal limits. No hydronephrosis.  Bladder is within normal limits.  Stomach/Bowel: Stomach is notable for a small hiatal hernia.  No evidence of bowel obstruction.  Sigmoid diverticulosis, without associated inflammatory changes.  Vascular/Lymphatic: Atherosclerotic calcifications of the abdominal aorta and branch vessels.  No suspicious abdominopelvic lymphadenopathy.  Reproductive: Status post hysterectomy.  No adnexal masses.  Other: No abdominopelvic ascites.  No hemoperitoneum or free air.  Mild subcutaneous stranding in the left anterior abdominal wall (series 2/image 66).  Mild bruising in the right gluteal region (series 2/image 82). Mild bruising/ hematoma in  the left gluteal region (series 2/image 76).  Musculoskeletal: Very mild degenerative changes at L3-4.  Fractures involving the right L3 and L4 transverse processes (series 2/ images 71 and 78).  IMPRESSION: Fractures involving the anterolateral right 3rd and 4th ribs.  Fractures involving the right posterolateral 4th-6th ribs.  Fractures involving the right L3 and L4 transverse processes.   Electronically Signed   By: Julian Hy M.D.   On: 12/19/2013 13:54   Ct Cervical Spine Wo Contrast  12/19/2013   CLINICAL DATA:  [place] downstairs.  Hit head.  EXAM: CT HEAD WITHOUT CONTRAST  CT CERVICAL SPINE WITHOUT CONTRAST  TECHNIQUE: Multidetector CT imaging of the head and cervical spine was performed following the standard protocol without intravenous contrast. Multiplanar CT image reconstructions of the cervical spine were also generated.  COMPARISON:  None.  FINDINGS: CT HEAD FINDINGS  Stable age related cerebral atrophy, ventriculomegaly and periventricular white matter disease. No extra-axial fluid collections are identified. No CT findings for acute hemispheric infarction or intracranial hemorrhage. No mass lesions. The brainstem and cerebellum are normal.  Large scalp hematoma noted at the right posterior vertex with laceration. No radiopaque foreign body. No underlying skull fracture.  CT CERVICAL SPINE FINDINGS  Normal alignment of the cervical vertebral bodies. Disc spaces and vertebral bodies are maintained. No acute fracture or abnormal prevertebral soft tissue swelling. The facets are normally aligned. The skullbase C1 and C1-2 articulations are maintained. The dens is normal. No large disc protrusions, spinal or foraminal stenosis. Scattered neck nodes are noted but no mass or overt adenopathy. The lung apices are clear.  IMPRESSION: Large right-sided scalp hematoma and laceration at the vertex but no underlying skull fracture. No acute intracranial findings.  Normal alignment and no acute cervical  spine fracture.   Electronically Signed   By: Kalman Jewels M.D.   On: 12/19/2013 13:43   Ct Abdomen Pelvis W Contrast  12/19/2013   CLINICAL DATA:  Witnessed fall down 13 steps onto concrete pad, severe right back pain, nausea/vomiting  EXAM: CT CHEST, ABDOMEN, AND PELVIS WITH CONTRAST  TECHNIQUE: Multidetector CT imaging of the chest, abdomen and pelvis was performed following the standard protocol during bolus administration of intravenous contrast.  CONTRAST:  176m OMNIPAQUE IOHEXOL 300 MG/ML  SOLN  COMPARISON:  None.  FINDINGS: CT CHEST FINDINGS  No evidence of thoracic aortic injury or mediastinal hematoma.  Mild dependent atelectasis in the lingula and bilateral lower lobes. No suspicious pulmonary nodules. No pleural effusion or pneumothorax.  Visualized right thyroid is notable for a 1.6 cm posterior right thyroid nodule (series 2/ image 9).  Heart is top-normal in size. No pericardial effusion. Mild atherosclerotic calcifications of the aortic arch.  No suspicious mediastinal, hilar, or axillary lymphadenopathy.  Mild degenerative changes of the thoracic spine.  Right anterolateral 3rd and 4th rib fractures (series 2/ images 24 and 29). Right posterolateral 4th through 6th rib fractures (series 2/ images 19, 22, and 25).  CT ABDOMEN AND PELVIS FINDINGS  Hepatobiliary: The liver is within normal limits.  Status post cholecystectomy. No intrahepatic ductal dilatation. Dilated common duct, which smoothly tapers at the ampulla.  Pancreas: Within normal limits.  Spleen: Within normal limits.  Adrenals/Urinary Tract: Adrenal glands are unremarkable.  1.5 cm posterior interpolar left renal cyst (series 2/ 68). Right kidney is within normal limits. No hydronephrosis.  Bladder is within normal limits.  Stomach/Bowel: Stomach is notable for a small hiatal hernia.  No evidence of bowel obstruction.  Sigmoid diverticulosis, without associated inflammatory changes.  Vascular/Lymphatic: Atherosclerotic  calcifications of the abdominal aorta and branch vessels.  No  suspicious abdominopelvic lymphadenopathy.  Reproductive: Status post hysterectomy.  No adnexal masses.  Other: No abdominopelvic ascites.  No hemoperitoneum or free air.  Mild subcutaneous stranding in the left anterior abdominal wall (series 2/image 66).  Mild bruising in the right gluteal region (series 2/image 82). Mild bruising/ hematoma in the left gluteal region (series 2/image 76).  Musculoskeletal: Very mild degenerative changes at L3-4.  Fractures involving the right L3 and L4 transverse processes (series 2/ images 71 and 78).  IMPRESSION: Fractures involving the anterolateral right 3rd and 4th ribs.  Fractures involving the right posterolateral 4th-6th ribs.  Fractures involving the right L3 and L4 transverse processes.   Electronically Signed   By: Julian Hy M.D.   On: 12/19/2013 13:54   Dg Shoulder Left  12/19/2013   CLINICAL DATA:  Fall, left shoulder pain  EXAM: LEFT SHOULDER - 2+ VIEW  COMPARISON:  None.  FINDINGS: There is no evidence of fracture or dislocation. There is no evidence of arthropathy or other focal bone abnormality. Soft tissues are unremarkable. Axillary view is suboptimally positioned due to patient immobility. No gross evidence for dislocation on the Y-view.  IMPRESSION: Negative.   Electronically Signed   By: Conchita Paris M.D.   On: 12/19/2013 13:47    Review of Systems  Constitutional: Negative.   HENT: Negative.   Eyes: Negative.   Respiratory: Negative.  Negative for shortness of breath.   Cardiovascular: Positive for chest pain. Negative for palpitations.  Gastrointestinal: Positive for nausea and vomiting.  Musculoskeletal: Negative.   Skin: Negative.   Neurological: Negative.   Endo/Heme/Allergies: Negative.   Psychiatric/Behavioral: Negative.     Blood pressure 140/50, pulse 62, temperature 97.7 F (36.5 C), temperature source Oral, resp. rate 16, height '5\' 2"'  (1.575 m), weight  69.4 kg (153 lb), SpO2 98 %. Physical Exam  Constitutional: She is oriented to person, place, and time. No distress.  HENT:  Head:    Eyes: EOM are normal. Pupils are equal, round, and reactive to light.  Neck: Normal range of motion. Neck supple.  Cardiovascular: Normal rate, regular rhythm and normal heart sounds.   Respiratory: Effort normal and breath sounds normal. No respiratory distress. She has no wheezes. She has no rales. She exhibits tenderness. She exhibits no laceration and no crepitus.    GI: Soft. Bowel sounds are normal. There is no tenderness.  Musculoskeletal: Normal range of motion.       Lumbar back: She exhibits tenderness and bony tenderness. She exhibits no swelling and no deformity.  Neurological: She is alert and oriented to person, place, and time. She has normal reflexes.  Skin: Skin is warm. She is diaphoretic.  Psychiatric: She has a normal mood and affect. Her behavior is normal. Judgment and thought content normal.     Assessment/Plan Fall down stairs from misstep +LOC No intracranial bleeding 4 posterior right rib fractures No hemothorax No Pneumothorax No chest wall crepitance L4 & L5 transverse process fractures Diabetic and hypertensive  Admit for pain control and Therapy Ordered PT for tomorrow Possible discharge 24-48 hours. Carbohydrate modified diet tonight.   , JAY 12/19/2013, 5:41 PM   Procedures

## 2013-12-19 NOTE — ED Notes (Signed)
Explained to delay to patient and family. Pt reporting still in severe pain.  Pt repositioned and gowned.

## 2013-12-19 NOTE — ED Notes (Signed)
Report called to Darla, RN at [facility]

## 2013-12-19 NOTE — ED Notes (Signed)
Fell down the stairs prior to arrival. Positive LOC. Pain at shoulder, back and headache 10/10. Little bit of nausea but "I get nauseous for everything". Hx of chronic back pain. Full ROM in all extremities. Pt tearful.

## 2013-12-19 NOTE — ED Notes (Signed)
Off the LSB. C-collar intact. No tenderness down the spine. Tenderness over the right shoulder blade.

## 2013-12-19 NOTE — ED Notes (Signed)
Pt returned from CT °

## 2013-12-20 ENCOUNTER — Inpatient Hospital Stay (HOSPITAL_COMMUNITY): Payer: Medicare HMO

## 2013-12-20 DIAGNOSIS — S2231XA Fracture of one rib, right side, initial encounter for closed fracture: Secondary | ICD-10-CM

## 2013-12-20 HISTORY — DX: Fracture of one rib, right side, initial encounter for closed fracture: S22.31XA

## 2013-12-20 LAB — BASIC METABOLIC PANEL
Anion gap: 12 (ref 5–15)
BUN: 11 mg/dL (ref 6–23)
CALCIUM: 9.5 mg/dL (ref 8.4–10.5)
CO2: 25 mEq/L (ref 19–32)
CREATININE: 0.72 mg/dL (ref 0.50–1.10)
Chloride: 101 mEq/L (ref 96–112)
GFR calc Af Amer: >90 mL/min (ref >90)
GFR, EST NON AFRICAN AMERICAN: 86 mL/min — AB (ref >90)
Glucose, Bld: 206 mg/dL — ABNORMAL HIGH (ref 70–99)
Potassium: 4.5 mEq/L (ref 3.7–5.3)
Sodium: 138 mEq/L (ref 137–147)

## 2013-12-20 LAB — CBC
HEMATOCRIT: 35.5 % — AB (ref 36.0–46.0)
HEMOGLOBIN: 11.9 g/dL — AB (ref 12.0–15.0)
MCH: 30 pg (ref 26.0–34.0)
MCHC: 33.5 g/dL (ref 30.0–36.0)
MCV: 89.4 fL (ref 78.0–100.0)
Platelets: 211 10*3/uL (ref 150–400)
RBC: 3.97 MIL/uL (ref 3.87–5.11)
RDW: 13.4 % (ref 11.5–15.5)
WBC: 7 10*3/uL (ref 4.0–10.5)

## 2013-12-20 LAB — GLUCOSE, CAPILLARY
GLUCOSE-CAPILLARY: 207 mg/dL — AB (ref 70–99)
GLUCOSE-CAPILLARY: 203 mg/dL — AB (ref 70–99)

## 2013-12-20 LAB — HEMOGLOBIN A1C
Hgb A1c MFr Bld: 11.1 % — ABNORMAL HIGH (ref <5.7)
Mean Plasma Glucose: 272 mg/dL — ABNORMAL HIGH (ref <117)

## 2013-12-20 MED ORDER — GUAIFENESIN 200 MG PO TABS
200.0000 mg | ORAL_TABLET | Freq: Four times a day (QID) | ORAL | Status: DC
  Administered 2013-12-20 – 2013-12-23 (×13): 200 mg via ORAL
  Filled 2013-12-20 (×17): qty 1

## 2013-12-20 NOTE — Plan of Care (Signed)
Problem: Phase I Progression Outcomes Goal: Pain controlled with appropriate interventions Outcome: Completed/Met Date Met:  12/20/13 Goal: Voiding-avoid urinary catheter unless indicated Outcome: Completed/Met Date Met:  12/20/13

## 2013-12-20 NOTE — Progress Notes (Signed)
Occupational Therapy Evaluation Patient Details Name: Valerie Wood MRN: [id number] DOB: [birth year] Today's Date: 12/20/2013    History of Present Illness Valerie Wood. Biel is a 68 y.o. female admitted 12/19/13 after a fall down stairs at a friend's house. Imaging on 11/14 shows posterior right rib fxs (4th and 6th) with no pneumothorax or effusion noted. X-ray negative for Lt shoulder fx. Conversely, CT of Abdomen pelvis on 11/13 demonstrates fxs of Right L3 and L4 transverse processes, Rt posterolateral 4-6th rib fxs, and anterolateral Rt 3rd and 4th ribs.    Clinical Impression   PTA pt lived at home alone and was independent with ADLs. Pt currently requires min (A) for LB ADLs and functional mobility and would benefit from acute OT to address problem areas listed below.     Follow Up Recommendations  No OT follow up;Supervision/Assistance - 24 hour    Equipment Recommendations  None recommended by OT    Recommendations for Other Services       Precautions / Restrictions Precautions Precautions: Fall Restrictions Weight Bearing Restrictions: No      Mobility Bed Mobility Overal bed mobility: Needs Assistance Bed Mobility: Supine to Sit     Supine to sit: Supervision;HOB elevated     General bed mobility comments: Supervision for safety. Use of bed rail.  Transfers Overall transfer level: Needs assistance Equipment used: None Transfers: Sit to/from [facility] Sit to Stand: Min guard Stand pivot transfers: Min assist (1 person hand held)       General transfer comment: Pt able to use LUE to pull to stand using bed rail. Hand held assist to take steps to chair.          ADL Overall ADL's : Needs assistance/impaired Eating/Feeding: Independent;Sitting   Grooming: Set up;Sitting   Upper Body Bathing: Set up;Sitting   Lower Body Bathing: Minimal assistance;Sit to/from stand Lower Body Bathing Details (indicate cue type and reason): (A) to  reach LLE Upper Body Dressing : Set up;Sitting   Lower Body Dressing: Minimal assistance;Sit to/from stand Lower Body Dressing Details (indicate cue type and reason): (A) to reach LLE Toilet Transfer: Ambulation;Minimal assistance (hand held assist)           Functional mobility during ADLs: Minimal assistance (hand held assist) General ADL Comments: Pt limited by rib and shoulder pain. Pt mildy unsteady on feet and demonstrates generalized weakness of UE. MMT contraindicated due to rib fxs and pt with guarding of Right side.      Vision  Pt reports no change in baseline.                    Perception Perception Perception Tested?: No   Praxis Praxis Praxis tested?: Within functional limits    Pertinent Vitals/Pain Pain Assessment: 0-10 Pain Score: 5  Pain Location: posterior ribs, Lt shoulder, Rt shoulder; worse when moving Pain Descriptors / Indicators: Aching;Sore Pain Intervention(s): Limited activity within patient's tolerance;Monitored during session;Repositioned     Hand Dominance Right   Extremity/Trunk Assessment Upper Extremity Assessment Upper Extremity Assessment: Generalized weakness (guarding due to rib pain)   Lower Extremity Assessment Lower Extremity Assessment: Defer to PT evaluation   Cervical / Trunk Assessment Cervical / Trunk Assessment: Normal   Communication Communication Communication: No difficulties   Cognition Arousal/Alertness: Awake/alert Behavior During Therapy: WFL for tasks assessed/performed Overall Cognitive Status: Within Functional Limits for tasks assessed  Home Living Family/patient expects to be discharged to:: Private residence Living Arrangements: Alone Available Help at Discharge: Family;Available PRN/intermittently Type of Home: House Home Access: Stairs to enter [employer] of Steps: 1 and 1   Home Layout: One level     Bathroom Shower/Tub: Emergency planning/management officer: Standard     Home Equipment: Shower seat   Additional Comments: Pt's husband was living at home and had PD and dementia, she recently put him into a SNF. Bathroom was equipped for her husband.       Prior Functioning/Environment Level of Independence: Independent             OT Diagnosis: Generalized weakness;Acute pain   OT Problem List: Decreased strength;Decreased range of motion;Decreased activity tolerance;Impaired balance (sitting and/or standing);Decreased safety awareness;Decreased knowledge of use of DME or AE;Pain;Impaired UE functional use   OT Treatment/Interventions: Self-care/ADL training;Therapeutic exercise;Energy conservation;DME and/or AE instruction;Therapeutic activities;Patient/family education;Balance training    OT Goals(Current goals can be found in the care plan section) Acute Rehab OT Goals Patient Stated Goal: to be able to use my arms and not hurt OT Goal Formulation: With patient Time For Goal Achievement: 01/03/14 Potential to Achieve Goals: Good ADL Goals Pt Will Perform Grooming: with modified independence;standing Pt Will Perform Lower Body Bathing: with modified independence;sit to/from stand Pt Will Perform Lower Body Dressing: with modified independence;sit to/from stand Pt Will Transfer to Toilet: with modified independence;ambulating Pt Will Perform Toileting - Clothing Manipulation and hygiene: with modified independence;sit to/from stand  OT Frequency: Min 2X/week   Barriers to D/C: Decreased caregiver support             End of Session  Activity Tolerance: Patient tolerated treatment well Patient left: in chair;with call bell/phone within reach   Time: 0817-0844 OT Time Calculation (min): 27 min Charges:  OT General Charges $OT Visit: 1 Procedure OT Evaluation $Initial OT Evaluation Tier I: 1 Procedure OT Treatments $Self Care/Home Management : 8-22 mins  Villa Herb M 12/20/2013, 10:05  AM  Cyndie Chime, OTR/L Occupational Therapist [phone] (pager)

## 2013-12-20 NOTE — Evaluation (Signed)
Physical Therapy Evaluation Patient Details Name: Valerie Wood MRN: [id number] DOB: Jun 08, 1945 Today's Date: 12/20/2013   History of Present Illness  Valerie Wood is a 68 y.o. female admitted 12/19/13 after a fall down stairs at a friend's house. Imaging on 11/14 shows posterior right rib fxs (4th and 6th) with no pneumothorax or effusion noted. X-ray negative for Lt shoulder fx. Conversely, CT of Abdomen pelvis on 11/13 demonstrates fxs of Right L3 and L4 transverse processes, Rt posterolateral 4-6th rib fxs, and anterolateral Rt 3rd and 4th ribs.   Clinical Impression  Pt admitted with s/p fall with limited PT evaluation due to nausea and vomiting once pt sat EOB and was returned to supine.  Noticeable nystagmus when pt returned to bed with increase nausea and reports of room spinning. Pt may benefit from vestibular assessment to r/o BPPV if pt able to tolerate.  Pt does report vertigo in past medical history however unable to report type of vertigo. Pt also reported LOC and short term memory unable to recall events until EMT was assisting her onto stretcher. Pt currently with functional limitations due to the deficits listed below (see PT Problem List).  Pt will benefit from skilled PT to increase their independence and safety with mobility to allow discharge to the venue listed below.      Follow Up Recommendations Other (comment);Supervision/Assistance - 24 hour    Equipment Recommendations  None recommended by PT    Recommendations for Other Services       Precautions / Restrictions Precautions Precautions: Fall Restrictions Weight Bearing Restrictions: No      Mobility  Bed Mobility Overal bed mobility: Needs Assistance Bed Mobility: Supine to Sit;Sit to Supine     Supine to sit: Min guard;HOB elevated Sit to supine: Min guard;HOB elevated   General bed mobility comments: Supervision for safety. Use of bed rail. (Pt plans to sleep in recliner when d/c home  initially)  Transfers Overall transfer level:  (unable due to vomiting and nausea)                  Ambulation/Gait             General Gait Details: Pt has recently ambulated in hallway with staff however currently nausea and vomited limiting PT evaluation.   Stairs            Wheelchair Mobility    Modified Rankin (Stroke Patients Only)       Balance                                             Pertinent Vitals/Pain Pain Assessment: 0-10 Pain Score: 5  Pain Location: posterior ribs, Lt shoulder, Rt shoulder; worse when moving Pain Descriptors / Indicators: Aching Pain Intervention(s): Limited activity within patient's tolerance;Repositioned;Patient requesting pain meds-RN notified    Home Living Family/patient expects to be discharged to:: Private residence Living Arrangements: Alone Available Help at Discharge: Family;Available PRN/intermittently Type of Home: House Home Access: Stairs to enter   Entrance Stairs-Number of Steps: 1 and 1 Home Layout: One level Home Equipment: Shower seat Additional Comments: Pt's husband was living at home and had PD and dementia, she recently put him into a SNF. Bathroom was equipped for her husband.     Prior Function Level of Independence: Independent  Hand Dominance   Dominant Hand: Right    Extremity/Trunk Assessment               Lower Extremity Assessment: Generalized weakness      Cervical / Trunk Assessment: Normal  Communication   Communication: No difficulties  Cognition Arousal/Alertness: Awake/alert Behavior During Therapy: Anxious Overall Cognitive Status: No family/caregiver present to determine baseline cognitive functioning (Pt easily off topic and very talkative with cues to redirect)                      General Comments      Exercises        Assessment/Plan    PT Assessment Patient needs continued PT services  PT  Diagnosis Difficulty walking;Generalized weakness;Acute pain   PT Problem List Decreased activity tolerance;Decreased balance;Decreased mobility;Decreased knowledge of use of DME;Decreased cognition  PT Treatment Interventions DME instruction;Gait training;Stair training;Functional mobility training;Therapeutic activities;Therapeutic exercise;Cognitive remediation;Patient/family education   PT Goals (Current goals can be found in the Care Plan section) Acute Rehab PT Goals Patient Stated Goal: To return home PT Goal Formulation: With patient Time For Goal Achievement: 12/27/13 Potential to Achieve Goals: Good    Frequency Min 5X/week   Barriers to discharge        Co-evaluation               End of Session   Activity Tolerance:  (Limited due to nausea and vomiting) Patient left: in bed;with call bell/phone within reach;with family/visitor present Nurse Communication: Mobility status;Patient requests pain meds         Time: 1230-1255 PT Time Calculation (min) (ACUTE ONLY): 25 min   Charges:   PT Evaluation $Initial PT Evaluation Tier I: 1 Procedure PT Treatments $Therapeutic Activity: 8-22 mins   PT G Codes:          , December 21, 2013, 1:02 PM   Antoine Poche, [place] DPT [phone]

## 2013-12-20 NOTE — Progress Notes (Signed)
Subjective: Stable and alert. Family in room. Tolerated breakfast fairly well without nausea. Complains of dizziness when she stands Complains of right rib pain and occipital scalp pain.  Chest x-ray today shows posterior right rib fractures, unchanged, no effusion or pneumothorax, increased left base and lingula atelectasis  Objective: Vital signs in last 24 hours: Temp:  [97.7 F (36.5 C)-98.1 F (36.7 C)] 98.1 F (36.7 C) (11/14 0714) Pulse Rate:  [62-74] 74 (11/14 0714) Resp:  [14-20] 18 (11/14 0714) BP: (106-140)/(50-94) 117/94 mmHg (11/14 0714) SpO2:  [91 %-100 %] 95 % (11/14 0714) Weight:  [153 lb (69.4 kg)] 153 lb (69.4 kg) (11/13 1650)    Intake/Output from previous day: 11/13 0701 - 11/14 0700 In: -  Out: 500 [Urine:500] Intake/Output this shift: Total I/O In: 480 [P.O.:480] Out: -   General appearance: alert. Sitting in chair. Cooperative. Minimal distress Head: Soft cephalohematoma of occiput. Without skin laceration  neck: Nontender to active and passive range of motion. Resp: clear to auscultation anteriorly. Decreased breath sounds at bases. No rhonchi or wheeze. Tender right chest wall GI: soft, non-tender; bowel sounds normal; no masses,  no organomegaly  Lab Results:   Recent Labs  12/19/13 1155 12/20/13 0508  WBC 10.7* 7.0  HGB 13.2 11.9*  HCT 39.0 35.5*  PLT 258 211   BMET  Recent Labs  12/19/13 1155 12/20/13 0508  NA 136* 138  K 3.9 4.5  CL 99 101  CO2 25 25  GLUCOSE 267* 206*  BUN 14 11  CREATININE 0.70 0.72  CALCIUM 9.8 9.5   PT/INR No results for input(s): LABPROT, INR in the last 72 hours. ABG No results for input(s): PHART, HCO3 in the last 72 hours.  Invalid input(s): PCO2, PO2  Studies/Results: Dg Chest 2 View  12/20/2013   CLINICAL DATA:  Fall, right rib fractures  EXAM: CHEST - 2 VIEW  COMPARISON:  12/19/2013  FINDINGS: Posterior right rib fractures again demonstrated. No developing pneumothorax or effusion.  Increased streaky left lower lobe and lingula opacities, most compatible with atelectasis. Trachea is midline. Normal heart size and vascularity. Cholecystectomy clips noted.  IMPRESSION: Posterior right rib fractures as before.  No developing pneumothorax or effusion  Increased left base and lingula atelectasis.   Electronically Signed   By: Daryll Brod M.D.   On: 12/20/2013 08:50   Dg Chest 2 View  12/19/2013   CLINICAL DATA:  [place] down 13 steps.  Severe right-sided back pain.  EXAM: CHEST  2 VIEW  COMPARISON:  Chest radiograph 03/26/2012. CT chest abdomen pelvis 12/19/2013  FINDINGS: Heart size is upper normal and stable. Mediastinal hilar contours within normal limits. The trachea is midline.  Two slightly displaced right-sided rib fractures are visible on plain radiography. Fractures are seen involving the posterior right fourth and sixth ribs.  No left-sided rib fracture is visualized. The clavicles are intact. There is scattered, streaky atelectasis at the left lung base. Negative for pneumothorax or definite airspace disease. No pleural effusion. Thoracic spine vertebral bodies appear normal in height and alignment.  IMPRESSION: Acute slightly displaced fractures of the right fourth and sixth ribs. Please also see the concurrent CT the chest abdomen pelvis.  Scattered atelectasis in the left lung base.   Electronically Signed   By: Curlene Dolphin M.D.   On: 12/19/2013 13:50   Ct Head Wo Contrast  12/19/2013   CLINICAL DATA:  [place] [address] downstairs.  Hit head.  EXAM: CT HEAD WITHOUT CONTRAST  CT CERVICAL SPINE WITHOUT CONTRAST  TECHNIQUE: Multidetector CT imaging of the head and cervical spine was performed following the standard protocol without intravenous contrast. Multiplanar CT image reconstructions of the cervical spine were also generated.  COMPARISON:  None.  FINDINGS: CT HEAD FINDINGS  Stable age related cerebral atrophy, ventriculomegaly and periventricular white matter disease. No extra-axial  fluid collections are identified. No CT findings for acute hemispheric infarction or intracranial hemorrhage. No mass lesions. The brainstem and cerebellum are normal.  Large scalp hematoma noted at the right posterior vertex with laceration. No radiopaque foreign body. No underlying skull fracture.  CT CERVICAL SPINE FINDINGS  Normal alignment of the cervical vertebral bodies. Disc spaces and vertebral bodies are maintained. No acute fracture or abnormal prevertebral soft tissue swelling. The facets are normally aligned. The skullbase C1 and C1-2 articulations are maintained. The dens is normal. No large disc protrusions, spinal or foraminal stenosis. Scattered neck nodes are noted but no mass or overt adenopathy. The lung apices are clear.  IMPRESSION: Large right-sided scalp hematoma and laceration at the vertex but no underlying skull fracture. No acute intracranial findings.  Normal alignment and no acute cervical spine fracture.   Electronically Signed   By: Kalman Jewels M.D.   On: 12/19/2013 13:43   Ct Chest W Contrast  12/19/2013   CLINICAL DATA:  Witnessed fall down 13 steps onto concrete pad, severe right back pain, nausea/vomiting  EXAM: CT CHEST, ABDOMEN, AND PELVIS WITH CONTRAST  TECHNIQUE: Multidetector CT imaging of the chest, abdomen and pelvis was performed following the standard protocol during bolus administration of intravenous contrast.  CONTRAST:  177mL OMNIPAQUE IOHEXOL 300 MG/ML  SOLN  COMPARISON:  None.  FINDINGS: CT CHEST FINDINGS  No evidence of thoracic aortic injury or mediastinal hematoma.  Mild dependent atelectasis in the lingula and bilateral lower lobes. No suspicious pulmonary nodules. No pleural effusion or pneumothorax.  Visualized right thyroid is notable for a 1.6 cm posterior right thyroid nodule (series 2/ image 9).  Heart is top-normal in size. No pericardial effusion. Mild atherosclerotic calcifications of the aortic arch.  No suspicious mediastinal, hilar, or  axillary lymphadenopathy.  Mild degenerative changes of the thoracic spine.  Right anterolateral 3rd and 4th rib fractures (series 2/ images 24 and 29). Right posterolateral 4th through 6th rib fractures (series 2/ images 19, 22, and 25).  CT ABDOMEN AND PELVIS FINDINGS  Hepatobiliary: The liver is within normal limits.  Status post cholecystectomy. No intrahepatic ductal dilatation. Dilated common duct, which smoothly tapers at the ampulla.  Pancreas: Within normal limits.  Spleen: Within normal limits.  Adrenals/Urinary Tract: Adrenal glands are unremarkable.  1.5 cm posterior interpolar left renal cyst (series 2/ 68). Right kidney is within normal limits. No hydronephrosis.  Bladder is within normal limits.  Stomach/Bowel: Stomach is notable for a small hiatal hernia.  No evidence of bowel obstruction.  Sigmoid diverticulosis, without associated inflammatory changes.  Vascular/Lymphatic: Atherosclerotic calcifications of the abdominal aorta and branch vessels.  No suspicious abdominopelvic lymphadenopathy.  Reproductive: Status post hysterectomy.  No adnexal masses.  Other: No abdominopelvic ascites.  No hemoperitoneum or free air.  Mild subcutaneous stranding in the left anterior abdominal wall (series 2/image 66).  Mild bruising in the right gluteal region (series 2/image 82). Mild bruising/ hematoma in the left gluteal region (series 2/image 76).  Musculoskeletal: Very mild degenerative changes at L3-4.  Fractures involving the right L3 and L4 transverse processes (series 2/ images 71 and 78).  IMPRESSION: Fractures involving the anterolateral right 3rd and 4th ribs.  Fractures involving the right posterolateral 4th-6th ribs.  Fractures involving the right L3 and L4 transverse processes.   Electronically Signed   By: Julian Hy M.D.   On: 12/19/2013 13:54   Ct Cervical Spine Wo Contrast  12/19/2013   CLINICAL DATA:  [place] downstairs.  Hit head.  EXAM: CT HEAD WITHOUT CONTRAST  CT CERVICAL SPINE  WITHOUT CONTRAST  TECHNIQUE: Multidetector CT imaging of the head and cervical spine was performed following the standard protocol without intravenous contrast. Multiplanar CT image reconstructions of the cervical spine were also generated.  COMPARISON:  None.  FINDINGS: CT HEAD FINDINGS  Stable age related cerebral atrophy, ventriculomegaly and periventricular white matter disease. No extra-axial fluid collections are identified. No CT findings for acute hemispheric infarction or intracranial hemorrhage. No mass lesions. The brainstem and cerebellum are normal.  Large scalp hematoma noted at the right posterior vertex with laceration. No radiopaque foreign body. No underlying skull fracture.  CT CERVICAL SPINE FINDINGS  Normal alignment of the cervical vertebral bodies. Disc spaces and vertebral bodies are maintained. No acute fracture or abnormal prevertebral soft tissue swelling. The facets are normally aligned. The skullbase C1 and C1-2 articulations are maintained. The dens is normal. No large disc protrusions, spinal or foraminal stenosis. Scattered neck nodes are noted but no mass or overt adenopathy. The lung apices are clear.  IMPRESSION: Large right-sided scalp hematoma and laceration at the vertex but no underlying skull fracture. No acute intracranial findings.  Normal alignment and no acute cervical spine fracture.   Electronically Signed   By: Kalman Jewels M.D.   On: 12/19/2013 13:43   Ct Abdomen Pelvis W Contrast  12/19/2013   CLINICAL DATA:  Witnessed fall down 13 steps onto concrete pad, severe right back pain, nausea/vomiting  EXAM: CT CHEST, ABDOMEN, AND PELVIS WITH CONTRAST  TECHNIQUE: Multidetector CT imaging of the chest, abdomen and pelvis was performed following the standard protocol during bolus administration of intravenous contrast.  CONTRAST:  140mL OMNIPAQUE IOHEXOL 300 MG/ML  SOLN  COMPARISON:  None.  FINDINGS: CT CHEST FINDINGS  No evidence of thoracic aortic injury or  mediastinal hematoma.  Mild dependent atelectasis in the lingula and bilateral lower lobes. No suspicious pulmonary nodules. No pleural effusion or pneumothorax.  Visualized right thyroid is notable for a 1.6 cm posterior right thyroid nodule (series 2/ image 9).  Heart is top-normal in size. No pericardial effusion. Mild atherosclerotic calcifications of the aortic arch.  No suspicious mediastinal, hilar, or axillary lymphadenopathy.  Mild degenerative changes of the thoracic spine.  Right anterolateral 3rd and 4th rib fractures (series 2/ images 24 and 29). Right posterolateral 4th through 6th rib fractures (series 2/ images 19, 22, and 25).  CT ABDOMEN AND PELVIS FINDINGS  Hepatobiliary: The liver is within normal limits.  Status post cholecystectomy. No intrahepatic ductal dilatation. Dilated common duct, which smoothly tapers at the ampulla.  Pancreas: Within normal limits.  Spleen: Within normal limits.  Adrenals/Urinary Tract: Adrenal glands are unremarkable.  1.5 cm posterior interpolar left renal cyst (series 2/ 68). Right kidney is within normal limits. No hydronephrosis.  Bladder is within normal limits.  Stomach/Bowel: Stomach is notable for a small hiatal hernia.  No evidence of bowel obstruction.  Sigmoid diverticulosis, without associated inflammatory changes.  Vascular/Lymphatic: Atherosclerotic calcifications of the abdominal aorta and branch vessels.  No suspicious abdominopelvic lymphadenopathy.  Reproductive: Status post hysterectomy.  No adnexal masses.  Other: No abdominopelvic ascites.  No hemoperitoneum or free air.  Mild subcutaneous stranding  in the left anterior abdominal wall (series 2/image 66).  Mild bruising in the right gluteal region (series 2/image 82). Mild bruising/ hematoma in the left gluteal region (series 2/image 76).  Musculoskeletal: Very mild degenerative changes at L3-4.  Fractures involving the right L3 and L4 transverse processes (series 2/ images 71 and 78).   IMPRESSION: Fractures involving the anterolateral right 3rd and 4th ribs.  Fractures involving the right posterolateral 4th-6th ribs.  Fractures involving the right L3 and L4 transverse processes.   Electronically Signed   By: Julian Hy M.D.   On: 12/19/2013 13:54   Dg Shoulder Left  12/19/2013   CLINICAL DATA:  Fall, left shoulder pain  EXAM: LEFT SHOULDER - 2+ VIEW  COMPARISON:  None.  FINDINGS: There is no evidence of fracture or dislocation. There is no evidence of arthropathy or other focal bone abnormality. Soft tissues are unremarkable. Axillary view is suboptimally positioned due to patient immobility. No gross evidence for dislocation on the Y-view.  IMPRESSION: Negative.   Electronically Signed   By: Conchita Paris M.D.   On: 12/19/2013 13:47    Anti-infectives: Anti-infectives    None      Assessment/Plan:  Fall down stairs from misstep +LOC No intracranial bleeding Has cephalohematoma of occiput without skin laceration 4 posterior right rib fractures No hemothorax No Pneumothorax Left base atelectasis.  Former smoker. Will intensify Pulmicort with incentive spirometry, flutter valve, and expectorant.  No chest wall crepitance L4 & L5 transverse process fractures Diabetic and hypertensive  Not ready for discharge today. PT and OT evaluations pending. Possible home tomorrow Discussed plans with patient and family.   LOS: 1 day    Adin Hector 12/20/2013

## 2013-12-21 LAB — GLUCOSE, CAPILLARY
GLUCOSE-CAPILLARY: 207 mg/dL — AB (ref 70–99)
Glucose-Capillary: 168 mg/dL — ABNORMAL HIGH (ref 70–99)
Glucose-Capillary: 171 mg/dL — ABNORMAL HIGH (ref 70–99)
Glucose-Capillary: 211 mg/dL — ABNORMAL HIGH (ref 70–99)
Glucose-Capillary: 231 mg/dL — ABNORMAL HIGH (ref 70–99)
Glucose-Capillary: 234 mg/dL — ABNORMAL HIGH (ref 70–99)

## 2013-12-21 MED ORDER — MECLIZINE HCL 25 MG PO TABS
25.0000 mg | ORAL_TABLET | Freq: Three times a day (TID) | ORAL | Status: DC
  Administered 2013-12-21 – 2013-12-23 (×7): 25 mg via ORAL
  Filled 2013-12-21 (×9): qty 1

## 2013-12-21 MED ORDER — TRAMADOL HCL 50 MG PO TABS
50.0000 mg | ORAL_TABLET | Freq: Four times a day (QID) | ORAL | Status: DC | PRN
  Administered 2013-12-21 – 2013-12-22 (×4): 50 mg via ORAL
  Filled 2013-12-21 (×4): qty 1

## 2013-12-21 NOTE — Progress Notes (Signed)
Physical Therapy Treatment Patient Details Name: Valerie Wood MRN: [id number] DOB: 12/11/1945 Today's Date: 12/21/2013    History of Present Illness Valerie Wood is a 69 y.o. female admitted 12/19/13 after a fall down stairs at a friend's house. Imaging on 11/14 shows posterior right rib fxs (4th and 6th) with no pneumothorax or effusion noted. X-ray negative for Lt shoulder fx. Conversely, CT of Abdomen pelvis on 11/13 demonstrates fxs of Right L3 and L4 transverse processes, Rt posterolateral 4-6th rib fxs, and anterolateral Rt 3rd and 4th ribs.     PT Comments    Patient with c/o pain (headache and Rt flank pain), dizziness, lightheadedness, and nausea.  Patient having difficulty separating symptoms to give clear picture of issues.  Focused on headache 10/10 - called RN for pain meds.  Performed partial vestibular evaluation - difficult to perform BPPV testing due to pain with movement and inability to tolerate trendelenburg position.  Patient able to ambulate with fairly good balance without dizziness.  Reports dizziness with changes in position in bed.  Will attempt to complete vestibular testing at next session.  Recommend HHPT at discharge for vestibular rehab.   Follow Up Recommendations  Home health PT;Supervision/Assistance - 24 hour (For vestibular rehab)     Equipment Recommendations  None recommended by PT    Recommendations for Other Services       Precautions / Restrictions Precautions Precautions: Fall Restrictions Weight Bearing Restrictions: No    Mobility  Bed Mobility Overal bed mobility: Needs Assistance Bed Mobility: Rolling;Sidelying to Sit;Sit to Supine Rolling: Supervision Sidelying to sit: Supervision   Sit to supine: Supervision   General bed mobility comments: Verbal cues for technique.  Supervision for safety.  Transfers Overall transfer level: Needs assistance Equipment used: None Transfers: Sit to/from Stand Sit to Stand: Min guard Stand  pivot transfers: Min guard       General transfer comment: Cues to move at slower pace, and pay attention to safety.  Patient initiated standing prior to IV Wood being unplugged and moved to that side of the bed.  Patient able to transfer to [facility] and to standing with min guard assist for balance/safety.  Ambulation/Gait Ambulation/Gait assistance: Min guard Ambulation Distance (Feet): 110 Feet Assistive device: None Gait Pattern/deviations: Step-through pattern;Decreased stride length (Decreased trunk rotation and arm swing) Gait velocity: Decreased Gait velocity interpretation: Below normal speed for age/gender General Gait Details: Patient with slow, guarded gait due to pain in Rt flank.  Patient is able to turn head to right to talk with PT during gait.  No loss of balance noted.  Patient reports feelings of lightheadedness during gait - not spinning/vertigo.   Stairs            Wheelchair Mobility    Modified Rankin (Stroke Patients Only)       Balance                                    Cognition Arousal/Alertness: Awake/alert Behavior During Therapy: Anxious;Impulsive Overall Cognitive Status: No family/caregiver present to determine baseline cognitive functioning (Patient with rambling conversation)                      Vestibular Rehab *Oculomotor:  Patient with difficulty completing testing - needed redirection.  Normal smooth pursuits and saccades. *Head thrust:  Normal to both sides. *Roll Test:  Able to complete to right side.  Patient  with increased "dizziness" that was ongoing, with c/o nausea.  No nystagmus noted.  Unable to test to left side - patient reports she needed to sit back up. *Hallpike Dix: Unable to perform testing with use of bed.  Patient unable to tolerate trendelenberg position due to headache. *Dizziness:  Patient having difficulty describing dizziness.  When asked about dizziness, she talks about headache and nausea.   When asked specifically, she reports she does not have spinning sensation when she is dizzy.  Difficult getting clear understanding of symptoms.    General Comments        Pertinent Vitals/Pain Pain Assessment: 0-10 Pain Score: 10-Worst pain ever Pain Location: Head, Rt flank Pain Descriptors / Indicators: Aching;Headache Pain Intervention(s): Limited activity within patient's tolerance;Patient requesting pain meds-RN notified    Home Living                      Prior Function            PT Goals (current goals can now be found in the care plan section) Progress towards PT goals: Progressing toward goals    Frequency  Min 5X/week    PT Plan Current plan remains appropriate    Co-evaluation             End of Session Equipment Utilized During Treatment: Gait belt Activity Tolerance: Patient limited by pain;Patient limited by fatigue Patient left: in bed;with call bell/phone within reach;with family/visitor present     Time: 3235-5732 PT Time Calculation (min) (ACUTE ONLY): 43 min  Charges:  $Gait Training: 8-22 mins $Therapeutic Activity: 23-37 mins                    G Codes:      Valerie Wood 2013-12-24, 6:08 PM Valerie Wood, [place] Pager [phone]

## 2013-12-21 NOTE — Progress Notes (Signed)
Subjective: Intermittent vertigo, which is a chronic problem On Antivert when necessary and Imitrex for headaches. She says she cannot go home because of nausea and vomiting. Using Dilaudid for pain. Denies respiratory problems Denies abdominal pain.  Objective: Vital signs in last 24 hours: Temp:  [97.4 F (36.3 C)-98 F (36.7 C)] 98 F (36.7 C) (11/15 0533) Pulse Rate:  [58-70] 67 (11/15 0533) Resp:  [16-20] 16 (11/15 0533) BP: (103-130)/(52-76) 129/58 mmHg (11/15 0533) SpO2:  [94 %-100 %] 94 % (11/15 0533) Last BM Date: 12/19/13  Intake/Output from previous day: 11/14 0701 - 11/15 0700 In: 600 [P.O.:600] Out: 450 [Urine:450] Intake/Output this shift:    EXAM: General appearance: alert. Sitting in chair. Cooperative. some anxiety and rapid pressured speech. Head: Soft cephalohematoma of occiput. Without skin laceration  neck: Nontender to active and passive range of motion. Resp: clear to auscultation anteriorly. Decreased breath sounds at bases. No rhonchi or wheeze. Tender right chest wall GI: soft, non-tender; bowel sounds normal; no masses, no organomegaly    Lab Results:   Recent Labs  12/19/13 1155 12/20/13 0508  WBC 10.7* 7.0  HGB 13.2 11.9*  HCT 39.0 35.5*  PLT 258 211   BMET  Recent Labs  12/19/13 1155 12/20/13 0508  NA 136* 138  K 3.9 4.5  CL 99 101  CO2 25 25  GLUCOSE 267* 206*  BUN 14 11  CREATININE 0.70 0.72  CALCIUM 9.8 9.5   PT/INR No results for input(s): LABPROT, INR in the last 72 hours. ABG No results for input(s): PHART, HCO3 in the last 72 hours.  Invalid input(s): PCO2, PO2  Studies/Results: Dg Chest 2 View  12/20/2013   CLINICAL DATA:  Fall, right rib fractures  EXAM: CHEST - 2 VIEW  COMPARISON:  12/19/2013  FINDINGS: Posterior right rib fractures again demonstrated. No developing pneumothorax or effusion. Increased streaky left lower lobe and lingula opacities, most compatible with atelectasis. Trachea is midline.  Normal heart size and vascularity. Cholecystectomy clips noted.  IMPRESSION: Posterior right rib fractures as before.  No developing pneumothorax or effusion  Increased left base and lingula atelectasis.   Electronically Signed   By: Daryll Brod M.D.   On: 12/20/2013 08:50   Dg Chest 2 View  12/19/2013   CLINICAL DATA:  [place] down 13 steps.  Severe right-sided back pain.  EXAM: CHEST  2 VIEW  COMPARISON:  Chest radiograph 03/26/2012. CT chest abdomen pelvis 12/19/2013  FINDINGS: Heart size is upper normal and stable. Mediastinal hilar contours within normal limits. The trachea is midline.  Two slightly displaced right-sided rib fractures are visible on plain radiography. Fractures are seen involving the posterior right fourth and sixth ribs.  No left-sided rib fracture is visualized. The clavicles are intact. There is scattered, streaky atelectasis at the left lung base. Negative for pneumothorax or definite airspace disease. No pleural effusion. Thoracic spine vertebral bodies appear normal in height and alignment.  IMPRESSION: Acute slightly displaced fractures of the right fourth and sixth ribs. Please also see the concurrent CT the chest abdomen pelvis.  Scattered atelectasis in the left lung base.   Electronically Signed   By: Curlene Dolphin M.D.   On: 12/19/2013 13:50   Ct Head Wo Contrast  12/19/2013   CLINICAL DATA:  [place] downstairs.  Hit head.  EXAM: CT HEAD WITHOUT CONTRAST  CT CERVICAL SPINE WITHOUT CONTRAST  TECHNIQUE: Multidetector CT imaging of the head and cervical spine was performed following the standard protocol without intravenous contrast. Multiplanar CT  image reconstructions of the cervical spine were also generated.  COMPARISON:  None.  FINDINGS: CT HEAD FINDINGS  Stable age related cerebral atrophy, ventriculomegaly and periventricular white matter disease. No extra-axial fluid collections are identified. No CT findings for acute hemispheric infarction or intracranial hemorrhage. No  mass lesions. The brainstem and cerebellum are normal.  Large scalp hematoma noted at the right posterior vertex with laceration. No radiopaque foreign body. No underlying skull fracture.  CT CERVICAL SPINE FINDINGS  Normal alignment of the cervical vertebral bodies. Disc spaces and vertebral bodies are maintained. No acute fracture or abnormal prevertebral soft tissue swelling. The facets are normally aligned. The skullbase C1 and C1-2 articulations are maintained. The dens is normal. No large disc protrusions, spinal or foraminal stenosis. Scattered neck nodes are noted but no mass or overt adenopathy. The lung apices are clear.  IMPRESSION: Large right-sided scalp hematoma and laceration at the vertex but no underlying skull fracture. No acute intracranial findings.  Normal alignment and no acute cervical spine fracture.   Electronically Signed   By: Kalman Jewels M.D.   On: 12/19/2013 13:43   Ct Chest W Contrast  12/19/2013   CLINICAL DATA:  Witnessed fall down 13 steps onto concrete pad, severe right back pain, nausea/vomiting  EXAM: CT CHEST, ABDOMEN, AND PELVIS WITH CONTRAST  TECHNIQUE: Multidetector CT imaging of the chest, abdomen and pelvis was performed following the standard protocol during bolus administration of intravenous contrast.  CONTRAST:  131mL OMNIPAQUE IOHEXOL 300 MG/ML  SOLN  COMPARISON:  None.  FINDINGS: CT CHEST FINDINGS  No evidence of thoracic aortic injury or mediastinal hematoma.  Mild dependent atelectasis in the lingula and bilateral lower lobes. No suspicious pulmonary nodules. No pleural effusion or pneumothorax.  Visualized right thyroid is notable for a 1.6 cm posterior right thyroid nodule (series 2/ image 9).  Heart is top-normal in size. No pericardial effusion. Mild atherosclerotic calcifications of the aortic arch.  No suspicious mediastinal, hilar, or axillary lymphadenopathy.  Mild degenerative changes of the thoracic spine.  Right anterolateral 3rd and 4th rib  fractures (series 2/ images 24 and 29). Right posterolateral 4th through 6th rib fractures (series 2/ images 19, 22, and 25).  CT ABDOMEN AND PELVIS FINDINGS  Hepatobiliary: The liver is within normal limits.  Status post cholecystectomy. No intrahepatic ductal dilatation. Dilated common duct, which smoothly tapers at the ampulla.  Pancreas: Within normal limits.  Spleen: Within normal limits.  Adrenals/Urinary Tract: Adrenal glands are unremarkable.  1.5 cm posterior interpolar left renal cyst (series 2/ 68). Right kidney is within normal limits. No hydronephrosis.  Bladder is within normal limits.  Stomach/Bowel: Stomach is notable for a small hiatal hernia.  No evidence of bowel obstruction.  Sigmoid diverticulosis, without associated inflammatory changes.  Vascular/Lymphatic: Atherosclerotic calcifications of the abdominal aorta and branch vessels.  No suspicious abdominopelvic lymphadenopathy.  Reproductive: Status post hysterectomy.  No adnexal masses.  Other: No abdominopelvic ascites.  No hemoperitoneum or free air.  Mild subcutaneous stranding in the left anterior abdominal wall (series 2/image 66).  Mild bruising in the right gluteal region (series 2/image 82). Mild bruising/ hematoma in the left gluteal region (series 2/image 76).  Musculoskeletal: Very mild degenerative changes at L3-4.  Fractures involving the right L3 and L4 transverse processes (series 2/ images 71 and 78).  IMPRESSION: Fractures involving the anterolateral right 3rd and 4th ribs.  Fractures involving the right posterolateral 4th-6th ribs.  Fractures involving the right L3 and L4 transverse processes.   Electronically  Signed   By: Julian Hy M.D.   On: 12/19/2013 13:54   Ct Cervical Spine Wo Contrast  12/19/2013   CLINICAL DATA:  [place] downstairs.  Hit head.  EXAM: CT HEAD WITHOUT CONTRAST  CT CERVICAL SPINE WITHOUT CONTRAST  TECHNIQUE: Multidetector CT imaging of the head and cervical spine was performed following the  standard protocol without intravenous contrast. Multiplanar CT image reconstructions of the cervical spine were also generated.  COMPARISON:  None.  FINDINGS: CT HEAD FINDINGS  Stable age related cerebral atrophy, ventriculomegaly and periventricular white matter disease. No extra-axial fluid collections are identified. No CT findings for acute hemispheric infarction or intracranial hemorrhage. No mass lesions. The brainstem and cerebellum are normal.  Large scalp hematoma noted at the right posterior vertex with laceration. No radiopaque foreign body. No underlying skull fracture.  CT CERVICAL SPINE FINDINGS  Normal alignment of the cervical vertebral bodies. Disc spaces and vertebral bodies are maintained. No acute fracture or abnormal prevertebral soft tissue swelling. The facets are normally aligned. The skullbase C1 and C1-2 articulations are maintained. The dens is normal. No large disc protrusions, spinal or foraminal stenosis. Scattered neck nodes are noted but no mass or overt adenopathy. The lung apices are clear.  IMPRESSION: Large right-sided scalp hematoma and laceration at the vertex but no underlying skull fracture. No acute intracranial findings.  Normal alignment and no acute cervical spine fracture.   Electronically Signed   By: Kalman Jewels M.D.   On: 12/19/2013 13:43   Ct Abdomen Pelvis W Contrast  12/19/2013   CLINICAL DATA:  Witnessed fall down 13 steps onto concrete pad, severe right back pain, nausea/vomiting  EXAM: CT CHEST, ABDOMEN, AND PELVIS WITH CONTRAST  TECHNIQUE: Multidetector CT imaging of the chest, abdomen and pelvis was performed following the standard protocol during bolus administration of intravenous contrast.  CONTRAST:  156mL OMNIPAQUE IOHEXOL 300 MG/ML  SOLN  COMPARISON:  None.  FINDINGS: CT CHEST FINDINGS  No evidence of thoracic aortic injury or mediastinal hematoma.  Mild dependent atelectasis in the lingula and bilateral lower lobes. No suspicious pulmonary  nodules. No pleural effusion or pneumothorax.  Visualized right thyroid is notable for a 1.6 cm posterior right thyroid nodule (series 2/ image 9).  Heart is top-normal in size. No pericardial effusion. Mild atherosclerotic calcifications of the aortic arch.  No suspicious mediastinal, hilar, or axillary lymphadenopathy.  Mild degenerative changes of the thoracic spine.  Right anterolateral 3rd and 4th rib fractures (series 2/ images 24 and 29). Right posterolateral 4th through 6th rib fractures (series 2/ images 19, 22, and 25).  CT ABDOMEN AND PELVIS FINDINGS  Hepatobiliary: The liver is within normal limits.  Status post cholecystectomy. No intrahepatic ductal dilatation. Dilated common duct, which smoothly tapers at the ampulla.  Pancreas: Within normal limits.  Spleen: Within normal limits.  Adrenals/Urinary Tract: Adrenal glands are unremarkable.  1.5 cm posterior interpolar left renal cyst (series 2/ 68). Right kidney is within normal limits. No hydronephrosis.  Bladder is within normal limits.  Stomach/Bowel: Stomach is notable for a small hiatal hernia.  No evidence of bowel obstruction.  Sigmoid diverticulosis, without associated inflammatory changes.  Vascular/Lymphatic: Atherosclerotic calcifications of the abdominal aorta and branch vessels.  No suspicious abdominopelvic lymphadenopathy.  Reproductive: Status post hysterectomy.  No adnexal masses.  Other: No abdominopelvic ascites.  No hemoperitoneum or free air.  Mild subcutaneous stranding in the left anterior abdominal wall (series 2/image 66).  Mild bruising in the right gluteal region (series 2/image 82).  Mild bruising/ hematoma in the left gluteal region (series 2/image 76).  Musculoskeletal: Very mild degenerative changes at L3-4.  Fractures involving the right L3 and L4 transverse processes (series 2/ images 71 and 78).  IMPRESSION: Fractures involving the anterolateral right 3rd and 4th ribs.  Fractures involving the right posterolateral  4th-6th ribs.  Fractures involving the right L3 and L4 transverse processes.   Electronically Signed   By: Julian Hy M.D.   On: 12/19/2013 13:54   Dg Shoulder Left  12/19/2013   CLINICAL DATA:  Fall, left shoulder pain  EXAM: LEFT SHOULDER - 2+ VIEW  COMPARISON:  None.  FINDINGS: There is no evidence of fracture or dislocation. There is no evidence of arthropathy or other focal bone abnormality. Soft tissues are unremarkable. Axillary view is suboptimally positioned due to patient immobility. No gross evidence for dislocation on the Y-view.  IMPRESSION: Negative.   Electronically Signed   By: Conchita Paris M.D.   On: 12/19/2013 13:47    Anti-infectives: Anti-infectives    None      Assessment/Plan:  Fall down stairs from misstep +LOC No intracranial bleeding  Intermittent vertigo, nausea, vomiting. Chronic. We will make Antivert every 6 hours, around-the-clock, not prn. Discontinue Dilaudid tramadol for pain  Has cephalohematoma of occiput without skin laceration  4 posterior right rib fractures No hemothorax No Pneumothorax Left base atelectasis. Former smoker. Will intensify Pulmicort with incentive spirometry, flutter valve, and expectorant. No chest wall crepitance L4 & L5 transverse process fractures  Diabetic and hypertensive  PT/OT- no equipment needs at home.  Not ready for discharge today. Possible home tomorrow Discussed plans with patient and family.    LOS: 2 days    , M 12/21/2013

## 2013-12-21 NOTE — Plan of Care (Signed)
Problem: Phase I Progression Outcomes Goal: OOB as tolerated unless otherwise ordered Outcome: Completed/Met Date Met:  12/21/13 Goal: Hemodynamically stable Outcome: Completed/Met Date Met:  12/21/13     

## 2013-12-22 DIAGNOSIS — S060X9A Concussion with loss of consciousness of unspecified duration, initial encounter: Secondary | ICD-10-CM | POA: Diagnosis present

## 2013-12-22 DIAGNOSIS — S060XAA Concussion with loss of consciousness status unknown, initial encounter: Secondary | ICD-10-CM | POA: Diagnosis present

## 2013-12-22 DIAGNOSIS — W19XXXA Unspecified fall, initial encounter: Secondary | ICD-10-CM | POA: Diagnosis present

## 2013-12-22 DIAGNOSIS — S32009A Unspecified fracture of unspecified lumbar vertebra, initial encounter for closed fracture: Secondary | ICD-10-CM | POA: Diagnosis present

## 2013-12-22 DIAGNOSIS — G43909 Migraine, unspecified, not intractable, without status migrainosus: Secondary | ICD-10-CM | POA: Diagnosis present

## 2013-12-22 DIAGNOSIS — E119 Type 2 diabetes mellitus without complications: Secondary | ICD-10-CM | POA: Insufficient documentation

## 2013-12-22 DIAGNOSIS — D62 Acute posthemorrhagic anemia: Secondary | ICD-10-CM | POA: Diagnosis not present

## 2013-12-22 DIAGNOSIS — G43109 Migraine with aura, not intractable, without status migrainosus: Secondary | ICD-10-CM | POA: Diagnosis present

## 2013-12-22 LAB — GLUCOSE, CAPILLARY
Glucose-Capillary: 226 mg/dL — ABNORMAL HIGH (ref 70–99)
Glucose-Capillary: 250 mg/dL — ABNORMAL HIGH (ref 70–99)
Glucose-Capillary: 264 mg/dL — ABNORMAL HIGH (ref 70–99)
Glucose-Capillary: 188 mg/dL — ABNORMAL HIGH (ref 70–99)

## 2013-12-22 MED ORDER — ASPIRIN EC 81 MG PO TBEC
81.0000 mg | DELAYED_RELEASE_TABLET | Freq: Every day | ORAL | Status: DC
  Administered 2013-12-22 – 2013-12-23 (×2): 81 mg via ORAL
  Filled 2013-12-22 (×2): qty 1

## 2013-12-22 MED ORDER — TRAMADOL HCL 50 MG PO TABS
50.0000 mg | ORAL_TABLET | Freq: Four times a day (QID) | ORAL | Status: DC | PRN
  Administered 2013-12-22: 100 mg via ORAL
  Administered 2013-12-23: 50 mg via ORAL
  Filled 2013-12-22: qty 1
  Filled 2013-12-22: qty 2

## 2013-12-22 MED ORDER — INSULIN DETEMIR 100 UNIT/ML FLEXPEN: 26.0000 [IU] | PEN_INJECTOR | Freq: Every day | SUBCUTANEOUS | Status: DC

## 2013-12-22 MED ORDER — VITAMIN D3 25 MCG (1000 UNIT) PO TABS
1000.0000 [IU] | ORAL_TABLET | Freq: Every day | ORAL | Status: DC
  Administered 2013-12-22 – 2013-12-23 (×2): 1000 [IU] via ORAL
  Filled 2013-12-22 (×2): qty 1

## 2013-12-22 MED ORDER — POLYETHYLENE GLYCOL 3350 17 G PO PACK: 17.0000 g | PACK | Freq: Every day | ORAL | Status: DC | PRN

## 2013-12-22 MED ORDER — INSULIN DETEMIR 100 UNIT/ML ~~LOC~~ SOLN
26.0000 [IU] | Freq: Every day | SUBCUTANEOUS | Status: DC
  Administered 2013-12-22: 26 [IU] via SUBCUTANEOUS
  Filled 2013-12-22 (×2): qty 0.26

## 2013-12-22 NOTE — Progress Notes (Signed)
Inpatient Diabetes Program Recommendations  AACE/ADA: New Consensus Statement on Inpatient Glycemic Control (2013)  Target Ranges:  Prepandial:   less than 140 mg/dL      Peak postprandial:   less than 180 mg/dL (1-2 hours)      Critically ill patients:  140 - 180 mg/dL   Reason for Assessment: Elevated A1C  Diabetes history: Diabetes treated with Levemir Outpatient Diabetes medications: Levemir 26 units at HS Current orders for Inpatient glycemic control: Levemir 26 units at HS starting tonight, Moderate Novolog correction scale tid with meals.  Note:  A1C is 11.1.  If patient is discharged home with HHPT, also request that [facility] assess/make recommendations regarding patient's self-management of diabetes.  Thank you.   S. Marcelline Mates, RN, CNS, CDE Inpatient Diabetes Program, team pager [phone]

## 2013-12-22 NOTE — Progress Notes (Addendum)
UR completed.  Anticipate need for HHPT. Await therapy session today.  Pt has communicated through the CSW that she wishes to have a HHAide as well to assist with bathing.   Sandi Mariscal, RN BSN [place] CCM Trauma/Neuro ICU Case Manager [phone]

## 2013-12-22 NOTE — Progress Notes (Signed)
Patient ID: Valerie Wood, female   DOB: 11/23/1945, 68 y.o.   MRN: [id number]   LOS: 3 days   Subjective: Myriad complaints. Thinks she might be getting a sinus infection. Has bad HA and rib pain. Vertigo is still an issue. Lives alone.   Objective: Vital signs in last 24 hours: Temp:  [97.9 F (36.6 C)-98.1 F (36.7 C)] 97.9 F (36.6 C) (11/16 0512) Pulse Rate:  [73-92] 73 (11/16 0512) Resp:  [18-20] 18 (11/16 0512) BP: (121-135)/(55-76) 121/55 mmHg (11/16 0512) SpO2:  [90 %-96 %] 90 % (11/16 0512) Last BM Date: 12/19/13   IS: 500ml   Laboratory Results CBG (last 3)   Recent Labs  12/21/13 1245 12/21/13 1706 12/21/13 2212  GLUCAP 211* 231* 234*    Physical Exam General appearance: alert and no distress Resp: rhonchi RUL Cardio: regular rate and rhythm GI: normal findings: bowel sounds normal and soft, non-tender   Assessment/Plan: Fall Concussion -- Will give dose of Imitrex to see if some of her pain (as well as her N/V) is post-concussive migraine Multiple right rib fxs -- Pulmonary toilet Lumbar TVP fxs ABL anemia -- Mild Multiple medical problems -- Home meds FEN -- Increase tramadol VTE -- SCD's, Lovenox Dispo -- Home once cleared by PT/OT     J. , PA-C Pager: [phone] General Trauma PA Pager: [phone]  12/22/2013 

## 2013-12-22 NOTE — Progress Notes (Addendum)
Physical Therapy Treatment Patient Details Name: Valerie Wood MRN: [id number] DOB: 1945-12-25 Today's Date: 12/22/2013    History of Present Illness Valerie Wood. Gutter is a 68 y.o. female admitted 12/19/13 after a fall down stairs at a friend's house. Imaging on 11/14 shows posterior right rib fxs (4th and 6th) with no pneumothorax or effusion noted. X-ray negative for Lt shoulder fx. Conversely, CT of Abdomen pelvis on 11/13 demonstrates fxs of Right L3 and L4 transverse processes, Rt posterolateral 4-6th rib fxs, and anterolateral Rt 3rd and 4th ribs.     PT Comments    Patient with improved mobility, and decreased dizziness symptoms today.  Good balance with gait.  Able to move in and out of bed with increased time, and no dizziness.  Vestibular testing has been negative.  Dizziness symptoms likely from concussion/headache.  Feel patient can be at home with frequent supervision of family/friends.  Continue to recommend HHPT for mobility/safety at discharge.   Follow Up Recommendations  Home health PT;Supervision - Intermittent     Equipment Recommendations  None recommended by PT    Recommendations for Other Services       Precautions / Restrictions Precautions Precautions: Fall Restrictions Weight Bearing Restrictions: No    Mobility  Bed Mobility Overal bed mobility: Modified Independent Bed Mobility: Rolling;Sidelying to Sit;Sit to Sidelying Rolling: Modified independent (Device/Increase time) Sidelying to sit: Modified independent (Device/Increase time)     Sit to sidelying: Modified independent (Device/Increase time) General bed mobility comments: Patient using bed rail.  No physical assist needed.  No c/o dizziness with position change (rolling or sidelying <> sit).  Transfers Overall transfer level: Modified independent Equipment used: None Transfers: Sit to/from Stand Sit to Stand: Modified independent (Device/Increase time)         General transfer comment:  Patient using good technique.  No assist required.  Ambulation/Gait Ambulation/Gait assistance: Modified independent (Device/Increase time) Ambulation Distance (Feet): 160 Feet Assistive device: None Gait Pattern/deviations: Step-through pattern Gait velocity: WFL   General Gait Details: Patient with guarded gait with decreased rotation due to pain.  Patient with good balance with gait.  Able to turn head to side and continue to ambulate without loss of balance.  No c/o dizziness or lightheadedness.   Stairs            Wheelchair Mobility    Modified Rankin (Stroke Patients Only)       Balance                                    Cognition Arousal/Alertness: Awake/alert Behavior During Therapy: Impulsive Overall Cognitive Status: Within Functional Limits for tasks assessed                      Exercises      General Comments        Pertinent Vitals/Pain Pain Assessment: 0-10 Pain Score: 7  Pain Location: Head and Rt flank Pain Descriptors / Indicators: Aching;Headache;Sore Pain Intervention(s): Premedicated before session;Repositioned    Home Living                      Prior Function            PT Goals (current goals can now be found in the care plan section) Progress towards PT goals: Progressing toward goals    Frequency  Min 5X/week    PT Plan Current plan  remains appropriate    Co-evaluation             End of Session   Activity Tolerance: Patient tolerated treatment well Patient left: in bed;with call bell/phone within reach;with family/visitor present (Reports she had already been up in chair today)     Time: 2353-6144 PT Time Calculation (min) (ACUTE ONLY): 24 min  Charges:  $Gait Training: 8-22 mins $Therapeutic Activity: 8-22 mins                    G Codes:      Despina Pole 01-08-2014, 3:12 PM Carita Pian. Sanjuana Kava, [place] Pager [phone]

## 2013-12-22 NOTE — Plan of Care (Signed)
Problem: Acute Rehab PT Goals(only PT should resolve) Goal: Pt Will Go Supine/Side To Sit Outcome: Completed/Met Date Met:  12/22/13 Goal: Pt Will Go Sit To Supine/Side Outcome: Completed/Met Date Met:  12/22/13 Goal: Patient Will Transfer Sit To/From Stand Outcome: Completed/Met Date Met:  12/22/13 Goal: Pt Will Ambulate Outcome: Completed/Met Date Met:  12/22/13

## 2013-12-22 NOTE — Clinical Social Work Note (Signed)
CSW completed SBIRT assessment with patient.  Patient states that rarely drinks and will maybe have half a glass of wine during the holidays.  Patient stated that she was not drinking at the time of her accident.   Patient became tearful when talking about her husband who she reports has parkinsons and dementia and is currently staying at [facility] since September of this year.  CSW spoke with patient concerning financial burdens because of her husband being a facility and discussed potential aids with CSW.    Patient lives alone and is hopeful that she can get home health aid to help her bath since her fall has limited mobility.  CSW informed RN Case Manager of patients wishes.  CSW signing off.  Domenica Reamer, [place] Social Worker [phone]

## 2013-12-22 NOTE — Progress Notes (Signed)
Occupational Therapy Treatment Patient Details Name: ZAMZAM WHINERY MRN: [id number] DOB: 1945-10-23 Today's Date: 12/22/2013    History of present illness Valerie Wood is a 68 y.o. female admitted 12/19/13 after a fall down stairs at a friend's house. Imaging on 11/14 shows posterior right rib fxs (4th and 6th) with no pneumothorax or effusion noted. X-ray negative for Lt shoulder fx. Conversely, CT of Abdomen pelvis on 11/13 demonstrates fxs of Right L3 and L4 transverse processes, Rt posterolateral 4-6th rib fxs, and anterolateral Rt 3rd and 4th ribs.    OT comments  Pt. Progressing well with acute OT goals.  Able to complete toileting, LB ADLS, and grooming tasks with limited cueing of safety concerns.  Barrier at this time remains HA and dizziness.  Pt. And dtr. State if able to find resolution to this feel pt. Is safe for home.  Not interested in SNF unless safety is an issue.  Home has been previously adapted with various modifications and DME so no equipment needs.    Follow Up Recommendations  No OT follow up;Supervision/Assistance - 24 hour    Equipment Recommendations  None recommended by OT    Recommendations for Other Services      Precautions / Restrictions Precautions Precautions: Fall       Mobility Bed Mobility               General bed mobility comments: up in chair upon arrival  Transfers Overall transfer level: Needs assistance Equipment used: None Transfers: Sit to/from Stand Sit to Stand: Min guard Stand pivot transfers: Min guard            Balance                                   ADL Overall ADL's : Needs assistance/impaired     Grooming: Wash/dry hands;Supervision/safety;Standing               Lower Body Dressing: Sit to/from stand;Supervision/safety   Toilet Transfer: Ambulation;Grab bars;Min guard   Toileting- Clothing Manipulation and Hygiene: Supervision/safety;Sit to/from stand       Functional mobility  during ADLs: Minimal assistance;Min guard General ADL Comments: Pt limited by rib and shoulder pain. Pt mildy unsteady on feet and demonstrates generalized weakness of UE.  educated on using LUE vs right for peri care to increase ability to reach and provided thoroughness.  pt. able to return safe demo of peri care.  able to reach b les by crossing over knees for lb dressing.  home and b.room is already adapted with grab bars and other dme.        Vision                     Perception     Praxis      Cognition   Behavior During Therapy: [facility] for tasks assessed/performed                         Extremity/Trunk Assessment               Exercises     Shoulder Instructions       General Comments      Pertinent Vitals/ Pain       Pain Assessment: No/denies pain Pain Location: headaches  Pain Intervention(s): Premedicated before session  Home Living  Prior Functioning/Environment              Frequency Min 2X/week     Progress Toward Goals  OT Goals(current goals can now be found in the care plan section)  Progress towards OT goals: Progressing toward goals     Plan Discharge plan remains appropriate    Co-evaluation                 End of Session     Activity Tolerance Patient tolerated treatment well   Patient Left in chair;with call bell/phone within reach;with family/visitor present   Nurse Communication  provided RN with dtr. Name and phone number to contact with any d/c planning information as dtr. Is in/out secondary to checking in on her father who is at a snf.          Time: 3845-3646 OT Time Calculation (min): 24 min  Charges: OT General Charges $OT Visit: 1 Procedure OT Treatments $Self Care/Home Management : 23-37 mins  Valerie Wood, COTA/L 12/22/2013, 9:36 AM

## 2013-12-23 LAB — URINALYSIS, ROUTINE W REFLEX MICROSCOPIC
Bilirubin Urine: NEGATIVE
Glucose, UA: 250 mg/dL — AB
HGB URINE DIPSTICK: NEGATIVE
Ketones, ur: NEGATIVE mg/dL
Leukocytes, UA: NEGATIVE
Nitrite: NEGATIVE
PH: 7 (ref 5.0–8.0)
PROTEIN: NEGATIVE mg/dL
Specific Gravity, Urine: 1.013 (ref 1.005–1.030)
Urobilinogen, UA: 1 mg/dL (ref 0.0–1.0)

## 2013-12-23 LAB — GLUCOSE, CAPILLARY
GLUCOSE-CAPILLARY: 165 mg/dL — AB (ref 70–99)
Glucose-Capillary: 215 mg/dL — ABNORMAL HIGH (ref 70–99)

## 2013-12-23 MED ORDER — DSS 100 MG PO CAPS: 100.0000 mg | ORAL_CAPSULE | Freq: Two times a day (BID) | ORAL | Status: DC

## 2013-12-23 MED ORDER — GUAIFENESIN 200 MG PO TABS: 200.0000 mg | ORAL_TABLET | Freq: Four times a day (QID) | ORAL | Status: DC | PRN

## 2013-12-23 MED ORDER — TRAMADOL HCL 50 MG PO TABS: 50.0000 mg | ORAL_TABLET | Freq: Four times a day (QID) | ORAL | Status: DC | PRN

## 2013-12-23 MED ORDER — MECLIZINE HCL 25 MG PO TABS: 25.0000 mg | ORAL_TABLET | Freq: Three times a day (TID) | ORAL | Status: DC

## 2013-12-23 MED ORDER — BISACODYL 10 MG RE SUPP: 10.0000 mg | Freq: Every day | RECTAL | Status: DC | PRN

## 2013-12-23 NOTE — Progress Notes (Signed)
D/c to home with daughter via wheelchair.  Breathing regular and unlabored on room air. VSS. Pain denied.

## 2013-12-23 NOTE — Progress Notes (Signed)
HHPT/OT/RN/AIDE arranged for patient at home with [facility] per pt choice and insurance network.  Confirmed that address and phone number in EPIC are correct.   Medicare IM (Important Message) delivered to patient today by me in anticipation of discharge.   Sandi Mariscal, RN BSN MHA CCM  Case Manager, Trauma Service/Unit 38M [phone]

## 2013-12-23 NOTE — Clinical Social Work Psychosocial (Signed)
Clinical Social Work Department BRIEF PSYCHOSOCIAL ASSESSMENT 12/23/2013  Patient:  Valerie Wood, Valerie Wood     Account Number:  [id number]     Admit date:  12/19/2013  Clinical Social Worker:  Domenica Reamer, [place]  Date/Time:  12/22/2013 10:30 AM  Referred by:  Physician  Date Referred:  12/22/2013 Referred for  Other - See comment   Other Referral:   Trauma SBIRT eval   Interview type:  Patient Other interview type:    PSYCHOSOCIAL DATA Living Status:  ALONE Admitted from facility:   Level of care:   Primary support name:  Kathlene November Primary support relationship to patient:  CHILD, ADULT Degree of support available:   Patient reports high level of support from family who lives locally- daughter, son in law, grandchildren.    CURRENT CONCERNS Current Concerns  Post-Acute Placement   Other Concerns:   financial burden due to spouses nursing home placement    SOCIAL WORK ASSESSMENT / PLAN CSW conducted SBIRT evaluation for trauma patient.  Patient reported that they were not drinking during their fall down the stairs and stated that they drink less than once a year.  Patient began speaking about her husband who is in a nursing home due to dementia and parkinsons- been placed since September.  Since then patient has been living alone. Patient became tearful talking about the guilt associated with placing her husband.  CSW provided emotional support and validation for her choices.  Patient also spoke about financial burden from her husbands placement- CSW encouraged patient to discuss Medicaid with the nursing home and offered additional resources for food.  Patient declinded alternate resources but will pursue Medicaid. CSW signing off for now.   Assessment/plan status:  Psychosocial Support/Ongoing Assessment of Needs Other assessment/ plan:   none   Information/referral to community resources:   [place] Medicaid    PATIENT'S/FAMILY'S RESPONSE TO PLAN OF  CARE: Patient is agreeable to PT recommendation for home health and is looking forward to being able to visit her husband again soon.       Domenica Reamer, [place] Social Worker [phone]

## 2013-12-23 NOTE — Discharge Summary (Signed)
[facility] Trauma Service Discharge Summary   Patient ID: Valerie Wood MRN: [id number] DOB/AGE: 1945-04-14 68 y.o.  Admit date: 12/19/2013 Discharge date: 12/23/2013  Discharge Diagnoses Patient Active Problem List   Diagnosis Date Noted  . Fall 12/22/2013  . Concussion 12/22/2013  . Lumbar transverse process fracture 12/22/2013  . Acute blood loss anemia 12/22/2013  . Migraine 12/22/2013  . DM (diabetes mellitus) 12/22/2013  . Fracture of multiple ribs of right side 12/19/2013  . GERD (gastroesophageal reflux disease) 08/03/2011  . Gastroparesis 08/03/2011  . Anxiety 08/03/2011  . Rectocele 05/12/2011  . 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 COLONIC POLYPS, ADENOMATOUS, HX OF 05/02/2007    Consultants None  Procedures None  Hospital Course:  68 y/o white female fell down the stairs after a misstep.  Associated symptoms include chest pain, nausea and vomiting. The symptoms are aggravated by twisting. She has tried immobilization and oral narcotics with mild relief.  Workup showed a concussion, multiple right rib fractures, lumbar TP fractures, ABL anemia.  Patient was admitted and was transferred to the floor.  Diet was advanced as tolerated.  Therapies worked with the patient and ultimately recommended intermittent supervision at home with [facility] services.  She had trouble with dizziness which is chronic for her, but these symptoms have significantly improved.  Her headaches are also improving.  On HD #4, the patient was voiding well, tolerating diet, ambulating well, pain well controlled, vital signs stable, and felt stable for discharge home with intermittent supervision.  Patient will follow up in our office as needed and knows to call with questions or concerns.  [facility] services will be set up for her for PT/OT.  She is encouraged to follow up with her PCP at discharge to discuss her chronic dizziness.   Physical Exam: General: pleasant, WD/WN white female who is laying in bed  in NAD HEENT: head is normocephalic.  Sclera are noninjected.  PERRL.  Ears and nose without any masses or lesions.  Mouth is pink and moist Heart: regular, rate, and rhythm.  Normal s1,s2. No obvious murmurs, gallops, or rubs noted.   Lungs: CTAB, no wheezes, rhonchi, or rales noted.  Respiratory effort nonlabored.  IS to 1000. Abd: soft, NT/ND, +BS, no masses, hernias, or organomegaly Psych: A&Ox3 with an appropriate affect.     Medication List    TAKE these medications        aspirin EC 81 MG tablet  Take 81 mg by mouth daily.     atorvastatin 40 MG tablet  Commonly known as:  LIPITOR  Take 40 mg by mouth at bedtime.     bisacodyl 10 MG suppository  Commonly known as:  DULCOLAX  Place 1 suppository (10 mg total) rectally daily as needed for moderate constipation or severe constipation.     budesonide-formoterol 160-4.5 MCG/ACT inhaler  Commonly known as:  SYMBICORT  Inhale 2 puffs into the lungs 2 (two) times daily.     cetirizine 10 MG tablet  Commonly known as:  ZYRTEC  Take 10 mg by mouth at bedtime.     cholecalciferol 1000 UNITS tablet  Commonly known as:  VITAMIN D  Take 1,000 Units by mouth daily.     DSS 100 MG Caps  Take 100 mg by mouth 2 (two) times daily.     FISH OIL PO  Take 1 capsule by mouth daily.     guaiFENesin 200 MG tablet  Take 1 tablet (200 mg total) by mouth every 6 (six) hours as  needed for cough or to loosen phlegm.     ibuprofen 200 MG tablet  Commonly known as:  ADVIL,MOTRIN  Take 400 mg by mouth daily as needed (pain).     LEVEMIR FLEXTOUCH 100 UNIT/ML Pen  Generic drug:  Insulin Detemir  Inject 26 Units into the skin at bedtime.     losartan 50 MG tablet  Commonly known as:  COZAAR  Take 50 mg by mouth at bedtime.     meclizine 25 MG tablet  Commonly known as:  ANTIVERT  Take 1 tablet (25 mg total) by mouth 3 (three) times daily.     omeprazole 20 MG capsule  Commonly known as:  PRILOSEC  Take 20 mg by mouth at bedtime.      polyethylene glycol packet  Commonly known as:  MIRALAX / GLYCOLAX  Take 17 g by mouth daily as needed (constipation). Mix in coffee and drink     SUMAtriptan 100 MG tablet  Commonly known as:  IMITREX  Take 100 mg by mouth every 2 (two) hours as needed for migraine.     traMADol 50 MG tablet  Commonly known as:  ULTRAM  Take 1-2 tablets (50-100 mg total) by mouth every 6 (six) hours as needed (50mg  for mild pain, 75mg  for moderate pain, 100mg  for severe pain).         Follow-up Information    Follow up with SHAW,KIMBERLEE, MD. Schedule an appointment as soon as possible for a visit in 2 weeks.   Specialty:  Family Medicine   Why:  For post-hospital follow up   Contact information:   [address] [place] [address] [phone]       Follow up with [facility].   Why:  As needed, If symptoms worsen   Contact information:   [place] [address] [phone]       Signed: Nehemiah Massed. [place], [facility]  Trauma Service [phone]  12/23/2013, 9:15 AM

## 2013-12-23 NOTE — Discharge Instructions (Signed)
Rib Fracture A rib fracture is a break or crack in one of the bones of the ribs. The ribs are a group of long, curved bones that wrap around your chest and attach to your spine. They protect your lungs and other organs in the chest cavity. A broken or cracked rib is often painful, but most do not cause other problems. Most rib fractures heal on their own over time. However, rib fractures can be more serious if multiple ribs are broken or if broken ribs move out of place and push against other structures. CAUSES   A direct blow to the chest. For example, this could happen during contact sports, a car accident, or a fall against a hard object.  Repetitive movements with high force, such as pitching a baseball or having severe coughing spells. SYMPTOMS   Pain when you breathe in or cough.  Pain when someone presses on the injured area. DIAGNOSIS  Your caregiver will perform a physical exam. Various imaging tests may be ordered to confirm the diagnosis and to look for related injuries. These tests may include a chest X-ray, computed tomography (CT), magnetic resonance imaging (MRI), or a bone scan. TREATMENT  Rib fractures usually heal on their own in 1-3 months. The longer healing period is often associated with a continued cough or other aggravating activities. During the healing period, pain control is very important. Medication is usually given to control pain. Hospitalization or surgery may be needed for more severe injuries, such as those in which multiple ribs are broken or the ribs have moved out of place.  HOME CARE INSTRUCTIONS   Avoid strenuous activity and any activities or movements that cause pain. Be careful during activities and avoid bumping the injured rib.  Gradually increase activity as directed by your caregiver.  Only take over-the-counter or prescription medications as directed by your caregiver. Do not take other medications without asking your caregiver first.  Apply ice  to the injured area for the first 1-2 days after you have been treated or as directed by your caregiver. Applying ice helps to reduce inflammation and pain.  Put ice in a plastic bag.  Place a towel between your skin and the bag.   Leave the ice on for 15-20 minutes at a time, every 2 hours while you are awake.  Perform deep breathing as directed by your caregiver. This will help prevent pneumonia, which is a common complication of a broken rib. Your caregiver may instruct you to:  Take deep breaths several times a day.  Try to cough several times a day, holding a pillow against the injured area.  Use a device called an incentive spirometer to practice deep breathing several times a day.  Drink enough fluids to keep your urine clear or pale yellow. This will help you avoid constipation.   Do not wear a rib belt or binder. These restrict breathing, which can lead to pneumonia.  SEEK IMMEDIATE MEDICAL CARE IF:   You have a fever.   You have difficulty breathing or shortness of breath.   You develop a continual cough, or you cough up thick or bloody sputum.  You feel sick to your stomach (nausea), throw up (vomit), or have abdominal pain.   You have worsening pain not controlled with medications.  MAKE SURE YOU:  Understand these instructions.  Will watch your condition.  Will get help right away if you are not doing well or get worse. Document Released: 01/23/2005 Document Revised: 09/25/2012 Document Reviewed:  03/27/2012 [employer] Patient Information 2015 [place], [place]. This information is not intended to replace advice given to you by your health care provider. Make sure you discuss any questions you have with your health care provider.  Dizziness Dizziness is a common problem. It is a feeling of unsteadiness or light-headedness. You may feel like you are about to faint. Dizziness can lead to injury if you stumble or fall. A person of any age group can suffer from  dizziness, but dizziness is more common in older adults. CAUSES  Dizziness can be caused by many different things, including:  Middle ear problems.  Standing for too long.  Infections.  An allergic reaction.  Aging.  An emotional response to something, such as the sight of blood.  Side effects of medicines.  Tiredness.  Problems with circulation or blood pressure.  Excessive use of alcohol or medicines, or illegal drug use.  Breathing too fast (hyperventilation).  An irregular heart rhythm (arrhythmia).  A low red blood cell count (anemia).  Pregnancy.  Vomiting, diarrhea, fever, or other illnesses that cause body fluid loss (dehydration).  Diseases or conditions such as Parkinson's disease, high blood pressure (hypertension), diabetes, and thyroid problems.  Exposure to extreme heat. DIAGNOSIS  Your health care provider will ask about your symptoms, perform a physical exam, and perform an electrocardiogram (ECG) to record the electrical activity of your heart. Your health care provider may also perform other heart or blood tests to determine the cause of your dizziness. These may include:  Transthoracic echocardiogram (TTE). During echocardiography, sound waves are used to evaluate how blood flows through your heart.  Transesophageal echocardiogram (TEE).  Cardiac monitoring. This allows your health care provider to monitor your heart rate and rhythm in real time.  Holter monitor. This is a portable device that records your heartbeat and can help diagnose heart arrhythmias. It allows your health care provider to track your heart activity for several days if needed.  Stress tests by exercise or by giving medicine that makes the heart beat faster. TREATMENT  Treatment of dizziness depends on the cause of your symptoms and can vary greatly. HOME CARE INSTRUCTIONS   Drink enough fluids to keep your urine clear or pale yellow. This is especially important in very hot  weather. In older adults, it is also important in cold weather.  Take your medicine exactly as directed if your dizziness is caused by medicines. When taking blood pressure medicines, it is especially important to get up slowly.  Rise slowly from chairs and steady yourself until you feel okay.  In the morning, first sit up on the side of the bed. When you feel okay, stand slowly while holding onto something until you know your balance is fine.  Move your legs often if you need to stand in one place for a long time. Tighten and relax your muscles in your legs while standing.  Have someone stay with you for 1-2 days if dizziness continues to be a problem. Do this until you feel you are well enough to stay alone. Have the person call your health care provider if he or she notices changes in you that are concerning.  Do not drive or use heavy machinery if you feel dizzy.  Do not drink alcohol. SEEK IMMEDIATE MEDICAL CARE IF:   Your dizziness or light-headedness gets worse.  You feel nauseous or vomit.  You have problems talking, walking, or using your arms, hands, or legs.  You feel weak.  You are not  thinking clearly or you have trouble forming sentences. It may take a friend or family member to notice this.  You have chest pain, abdominal pain, shortness of breath, or sweating.  Your vision changes.  You notice any bleeding.  You have side effects from medicine that seems to be getting worse rather than better. MAKE SURE YOU:   Understand these instructions.  Will watch your condition.  Will get help right away if you are not doing well or get worse. Document Released: 07/19/2000 Document Revised: 01/28/2013 Document Reviewed: 08/12/2010 [facility] Patient Information 2015 [place], [place]. This information is not intended to replace advice given to you by your health care provider. Make sure you discuss any questions you have with your health care provider.  Concussion A  concussion, or closed-head injury, is a brain injury caused by a direct blow to the head or by a quick and sudden movement (jolt) of the head or neck. Concussions are usually not life-threatening. Even so, the effects of a concussion can be serious. If you have had a concussion before, you are more likely to experience concussion-like symptoms after a direct blow to the head.  CAUSES  Direct blow to the head, such as from running into another player during a soccer game, being hit in a fight, or hitting your head on a hard surface.  A jolt of the head or neck that causes the brain to move back and forth inside the skull, such as in a car crash. SIGNS AND SYMPTOMS The signs of a concussion can be hard to notice. Early on, they may be missed by you, family members, and health care providers. You may look fine but act or feel differently. Symptoms are usually temporary, but they may last for days, weeks, or even longer. Some symptoms may appear right away while others may not show up for hours or days. Every head injury is different. Symptoms include:  Mild to moderate headaches that will not go away.  A feeling of pressure inside your head.  Having more trouble than usual:  Learning or remembering things you have heard.  Answering questions.  Paying attention or concentrating.  Organizing daily tasks.  Making decisions and solving problems.  Slowness in thinking, acting or reacting, speaking, or reading.  Getting lost or being easily confused.  Feeling tired all the time or lacking energy (fatigued).  Feeling drowsy.  Sleep disturbances.  Sleeping more than usual.  Sleeping less than usual.  Trouble falling asleep.  Trouble sleeping (insomnia).  Loss of balance or feeling lightheaded or dizzy.  Nausea or vomiting.  Numbness or tingling.  Increased sensitivity to:  Sounds.  Lights.  Distractions.  Vision problems or eyes that tire easily.  Diminished sense of  taste or smell.  Ringing in the ears.  Mood changes such as feeling sad or anxious.  Becoming easily irritated or angry for little or no reason.  Lack of motivation.  Seeing or hearing things other people do not see or hear (hallucinations). DIAGNOSIS Your health care provider can usually diagnose a concussion based on a description of your injury and symptoms. He or she will ask whether you passed out (lost consciousness) and whether you are having trouble remembering events that happened right before and during your injury. Your evaluation might include:  A brain scan to look for signs of injury to the brain. Even if the test shows no injury, you may still have a concussion.  Blood tests to be sure other problems are  not present. TREATMENT  Concussions are usually treated in an emergency department, in urgent care, or at a clinic. You may need to stay in the hospital overnight for further treatment.  Tell your health care provider if you are taking any medicines, including prescription medicines, over-the-counter medicines, and natural remedies. Some medicines, such as blood thinners (anticoagulants) and aspirin, may increase the chance of complications. Also tell your health care provider whether you have had alcohol or are taking illegal drugs. This information may affect treatment.  Your health care provider will send you home with important instructions to follow.  How fast you will recover from a concussion depends on many factors. These factors include how severe your concussion is, what part of your brain was injured, your age, and how healthy you were before the concussion.  Most people with mild injuries recover fully. Recovery can take time. In general, recovery is slower in older persons. Also, persons who have had a concussion in the past or have other medical problems may find that it takes longer to recover from their current injury. HOME CARE INSTRUCTIONS General  Instructions  Carefully follow the directions your health care provider gave you.  Only take over-the-counter or prescription medicines for pain, discomfort, or fever as directed by your health care provider.  Take only those medicines that your health care provider has approved.  Do not drink alcohol until your health care provider says you are well enough to do so. Alcohol and certain other drugs may slow your recovery and can put you at risk of further injury.  If it is harder than usual to remember things, write them down.  If you are easily distracted, try to do one thing at a time. For example, do not try to watch TV while fixing dinner.  Talk with family members or close friends when making important decisions.  Keep all follow-up appointments. Repeated evaluation of your symptoms is recommended for your recovery.  Watch your symptoms and tell others to do the same. Complications sometimes occur after a concussion. Older adults with a brain injury may have a higher risk of serious complications, such as a blood clot on the brain.  Tell your teachers, school nurse, school counselor, coach, athletic trainer, or work Freight forwarder about your injury, symptoms, and restrictions. Tell them about what you can or cannot do. They should watch for:  Increased problems with attention or concentration.  Increased difficulty remembering or learning new information.  Increased time needed to complete tasks or assignments.  Increased irritability or decreased ability to cope with stress.  Increased symptoms.  Rest. Rest helps the brain to heal. Make sure you:  Get plenty of sleep at night. Avoid staying up late at night.  Keep the same bedtime hours on weekends and weekdays.  Rest during the day. Take daytime naps or rest breaks when you feel tired.  Limit activities that require a lot of thought or concentration. These include:  Doing homework or job-related work.  Watching  TV.  Working on the computer.  Avoid any situation where there is potential for another head injury (football, hockey, soccer, basketball, martial arts, downhill snow sports and horseback riding). Your condition will get worse every time you experience a concussion. You should avoid these activities until you are evaluated by the appropriate follow-up health care providers. Returning To Your Regular Activities You will need to return to your normal activities slowly, not all at once. You must give your body and brain enough time  for recovery.  Do not return to sports or other athletic activities until your health care provider tells you it is safe to do so.  Ask your health care provider when you can drive, ride a bicycle, or operate heavy machinery. Your ability to react may be slower after a brain injury. Never do these activities if you are dizzy.  Ask your health care provider about when you can return to work or school. Preventing Another Concussion It is very important to avoid another brain injury, especially before you have recovered. In rare cases, another injury can lead to permanent brain damage, brain swelling, or death. The risk of this is greatest during the first 7-10 days after a head injury. Avoid injuries by:  Wearing a seat belt when riding in a car.  Drinking alcohol only in moderation.  Wearing a helmet when biking, skiing, skateboarding, skating, or doing similar activities.  Avoiding activities that could lead to a second concussion, such as contact or recreational sports, until your health care provider says it is okay.  Taking safety measures in your home.  Remove clutter and tripping hazards from floors and stairways.  Use grab bars in bathrooms and handrails by stairs.  Place non-slip mats on floors and in bathtubs.  Improve lighting in dim areas. SEEK MEDICAL CARE IF:  You have increased problems paying attention or concentrating.  You have increased  difficulty remembering or learning new information.  You need more time to complete tasks or assignments than before.  You have increased irritability or decreased ability to cope with stress.  You have more symptoms than before. Seek medical care if you have any of the following symptoms for more than 2 weeks after your injury:  Lasting (chronic) headaches.  Dizziness or balance problems.  Nausea.  Vision problems.  Increased sensitivity to noise or light.  Depression or mood swings.  Anxiety or irritability.  Memory problems.  Difficulty concentrating or paying attention.  Sleep problems.  Feeling tired all the time. SEEK IMMEDIATE MEDICAL CARE IF:  You have severe or worsening headaches. These may be a sign of a blood clot in the brain.  You have weakness (even if only in one hand, leg, or part of the face).  You have numbness.  You have decreased coordination.  You vomit repeatedly.  You have increased sleepiness.  One pupil is larger than the other.  You have convulsions.  You have slurred speech.  You have increased confusion. This may be a sign of a blood clot in the brain.  You have increased restlessness, agitation, or irritability.  You are unable to recognize people or places.  You have neck pain.  It is difficult to wake you up.  You have unusual behavior changes.  You lose consciousness. MAKE SURE YOU:  Understand these instructions.  Will watch your condition.  Will get help right away if you are not doing well or get worse. Document Released: 04/15/2003 Document Revised: 01/28/2013 Document Reviewed: 08/15/2012 [facility] Patient Information 2015 [place], [place]. This information is not intended to replace advice given to you by your health care provider. Make sure you discuss any questions you have with your health care provider.

## 2014-02-12 DIAGNOSIS — L821 Other seborrheic keratosis: Secondary | ICD-10-CM | POA: Diagnosis not present

## 2014-02-12 DIAGNOSIS — D2271 Melanocytic nevi of right lower limb, including hip: Secondary | ICD-10-CM | POA: Diagnosis not present

## 2014-02-20 DIAGNOSIS — F411 Generalized anxiety disorder: Secondary | ICD-10-CM | POA: Diagnosis not present

## 2014-02-20 DIAGNOSIS — E1149 Type 2 diabetes mellitus with other diabetic neurological complication: Secondary | ICD-10-CM | POA: Diagnosis not present

## 2014-02-20 DIAGNOSIS — S2239XS Fracture of one rib, unspecified side, sequela: Secondary | ICD-10-CM | POA: Diagnosis not present

## 2014-02-20 DIAGNOSIS — K3184 Gastroparesis: Secondary | ICD-10-CM | POA: Diagnosis not present

## 2014-02-20 DIAGNOSIS — E780 Pure hypercholesterolemia: Secondary | ICD-10-CM | POA: Diagnosis not present

## 2014-02-20 DIAGNOSIS — F0781 Postconcussional syndrome: Secondary | ICD-10-CM | POA: Diagnosis not present

## 2014-02-20 DIAGNOSIS — I10 Essential (primary) hypertension: Secondary | ICD-10-CM | POA: Diagnosis not present

## 2014-03-25 DIAGNOSIS — Z794 Long term (current) use of insulin: Secondary | ICD-10-CM | POA: Diagnosis not present

## 2014-03-25 DIAGNOSIS — E119 Type 2 diabetes mellitus without complications: Secondary | ICD-10-CM | POA: Diagnosis not present

## 2014-03-25 DIAGNOSIS — R42 Dizziness and giddiness: Secondary | ICD-10-CM | POA: Diagnosis not present

## 2014-04-06 ENCOUNTER — Encounter: Payer: Self-pay | Admitting: Physical Therapy

## 2014-04-06 ENCOUNTER — Ambulatory Visit: Payer: Commercial Managed Care - HMO | Attending: Internal Medicine | Admitting: Physical Therapy

## 2014-04-06 DIAGNOSIS — Z9181 History of falling: Secondary | ICD-10-CM | POA: Diagnosis not present

## 2014-04-06 DIAGNOSIS — H8111 Benign paroxysmal vertigo, right ear: Secondary | ICD-10-CM | POA: Insufficient documentation

## 2014-04-06 NOTE — Therapy (Signed)
[facility] [address] [place], [place], [address] Phone: [phone]   Fax:  [phone]  Physical Therapy Evaluation  Patient Details  Name: Valerie Wood MRN: [id number] Date of Birth: [birth year] Referring Provider:  Mayra Neer, MD  Encounter Date: 04/06/2014      PT End of Session - 04/06/14 1130    Visit Number 1  G1   Number of Visits 4   Date for PT Re-Evaluation 05/05/14   Authorization Type [employer]   PT Start Time 0802   PT Stop Time 2831   PT Time Calculation (min) 45 min      Past Medical History  Diagnosis Date  . Hypertension   . Shortness of breath     quit smoking 12 weeks ago  . Anxiety   . GERD (gastroesophageal reflux disease)   . Diabetes mellitus     pt didn't divulge DM during PAT interview but Dr. Paulene Floor nurse states that pt is type 2 diabetic, controlled by diet.  . Gastroparesis due to DM   . Hyperlipidemia   . Fibromyalgia   . Thyroid nodule   . Seasonal allergies   . Migraine   . Tobacco dependence   . Osteopenia   . Stricture and stenosis of esophagus   . Duodenitis without mention of hemorrhage   . 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 Esophagitis, unspecified   . Hx of colonic polyps   . Uterine cancer 1989    Past Surgical History  Procedure Laterality Date  . Cholecystectomy    . Abdominal hysterectomy  12/1987    BSO  . Appendectomy    . Tonsillectomy    . Rectocele repair  05/11/2011    Procedure: POSTERIOR REPAIR (RECTOCELE);  Surgeon: Cheri Fowler, MD;  Location: [facility];  Service: Gynecology;  Laterality: N/A;  . Colonoscopy      multiple  . Esophagogastroduodenoscopy      multiple  . Tubal ligation    . Bladder surgery      Tack     There were no vitals taken for this visit.  Visit Diagnosis:  BPPV (benign paroxysmal positional vertigo), right - Plan: PT plan of care cert/re-cert      Subjective Assessment - 04/06/14  1122    Symptoms Pt. fell down 13 steps from 2nd level into garage onto cement on 12-19-13; pt. sustained concussion and 4 fractured ribs and 2 fractured vertebrae;   Pertinent History herniated disc in low back; reports intermittent episodes of vertigo since 1991; vertigo has been rather constant since accident on 12-19-13   Patient Stated Goals resolve the vertigo   Currently in Pain? No/denies          [facility] PT Assessment - 04/06/14 1123    Assessment   Medical Diagnosis R BPPV   Onset Date 12/19/13   Precautions   Precautions Fall  vertigo   Balance Screen   Has the patient fallen in the past 6 months Yes   How many times? 1   Has the patient had a decrease in activity level because of a fear of falling?  Yes   Is the patient reluctant to leave their home because of a fear of falling?  No            Vestibular Assessment - 04/06/14 1127    Vestibular Assessment   General Observation Pt.is a 69 year old female with c/o vertigo that started after falling down flight  of stairs   Symptom Behavior   Type of Dizziness Spinning   Aggravating Factors Rolling to right;Lying supine       Pt. Had a positive R Dix-Hallpike test with rotary upbeating nystagmus - indicative of R BPPV; pt. Was treated with canalith Repositioning (Epley's maneuver) x 2 reps for R BPPV Pt. c/o nausea and vertigo after 2 reps - requested not to perform 3rd rep of Epley's maneuver today - due to not feeling well after treatment                  PT Short Term Goals - 04-29-2014 1136    PT SHORT TERM GOAL #1   Title same as LTG's           PT Long Term Goals - 2014/04/29 1136    PT LONG TERM GOAL #1   Title Pt. will have a negative R Dix-Hallpike to indicate that R BPPV has resolved.   Time 4   Period Weeks   Status New   PT LONG TERM GOAL #2   Title Pt. will report no vertigo with bed mobility or ambulation.   Time 4   Period Weeks   Status New   PT LONG TERM GOAL #3   Title  Independent in HEP for habituation of BPPV - Brandt-Daroff exercises.   Time 4   Period Weeks   Status New               Plan - 04/29/2014 1132    Clinical Impression Statement Pt. has positive R upbeating nystagmus indicative of R BPPV - c/o nausea and vertigo with Epley's maneuver   Pt will benefit from skilled therapeutic intervention in order to improve on the following deficits Decreased balance;Decreased mobility  vertigo   Rehab Potential Good   PT Frequency 2x / week   PT Duration 4 weeks   PT Treatment/Interventions ADLs/Self Care Home Management;Therapeutic activities;Patient/family education;Therapeutic exercise;Neuromuscular re-education;Balance training;Other (comment)  canalith repositioning   PT Next Visit Plan recheck R Dix-Hallpike;  Epley's maneuver for R BPPV   PT Home Exercise Plan Brandt-Daroff   Consulted and Agree with Plan of Care Patient          G-Codes - 04/29/2014 1141    Functional Limitation Other PT primary   Other PT Primary Current Status (G8676) At least 40 percent but less than 60 percent impaired, limited or restricted   Other PT Primary Goal Status (P9509) At least 1 percent but less than 20 percent impaired, limited or restricted       Problem List Patient Active Problem List   Diagnosis Date Noted  . Fall 12/22/2013  . Concussion 12/22/2013  . Lumbar transverse process fracture 12/22/2013  . Acute blood loss anemia 12/22/2013  . Migraine 12/22/2013  . DM (diabetes mellitus) 12/22/2013  . Fracture of multiple ribs of right side 12/19/2013  . GERD (gastroesophageal reflux disease) 08/03/2011  . Gastroparesis 08/03/2011  . Anxiety 08/03/2011  . Rectocele 05/12/2011  . 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 COLONIC POLYPS, ADENOMATOUS, HX OF 05/02/2007    Alda Lea, PT 04/29/2014, 11:50 AM  [facility] [address] [place], [place], [address] Phone: [phone]    Fax:  [phone]

## 2014-04-08 ENCOUNTER — Encounter: Payer: Self-pay | Admitting: Physical Therapy

## 2014-04-08 ENCOUNTER — Ambulatory Visit: Payer: Commercial Managed Care - HMO | Attending: Internal Medicine | Admitting: Physical Therapy

## 2014-04-08 DIAGNOSIS — H8111 Benign paroxysmal vertigo, right ear: Secondary | ICD-10-CM | POA: Diagnosis not present

## 2014-04-08 DIAGNOSIS — Z9181 History of falling: Secondary | ICD-10-CM | POA: Diagnosis not present

## 2014-04-08 NOTE — Therapy (Signed)
[facility] [address] [place], [place], [address] Phone: [phone]   Fax:  [phone]  Physical Therapy Treatment  Patient Details  Name: Valerie Wood MRN: [id number] Date of Birth: May 27, 1945 Referring Provider:  Mayra Neer, MD  Encounter Date: 04/08/2014      PT End of Session - 04/08/14 1554    Visit Number 2  G2   Number of Visits 8   Date for PT Re-Evaluation 05/05/14   Authorization Type [employer]   PT Start Time 1100   PT Stop Time 1145   PT Time Calculation (min) 45 min      Past Medical History  Diagnosis Date  . Hypertension   . Shortness of breath     quit smoking 12 weeks ago  . Anxiety   . GERD (gastroesophageal reflux disease)   . Diabetes mellitus     pt didn't divulge DM during PAT interview but Dr. Paulene Floor nurse states that pt is type 2 diabetic, controlled by diet.  . Gastroparesis due to DM   . Hyperlipidemia   . Fibromyalgia   . Thyroid nodule   . Seasonal allergies   . Migraine   . Tobacco dependence   . Osteopenia   . Stricture and stenosis of esophagus   . Duodenitis without mention of hemorrhage   . 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 Esophagitis, unspecified   . Hx of colonic polyps   . Uterine cancer 1989    Past Surgical History  Procedure Laterality Date  . Cholecystectomy    . Abdominal hysterectomy  12/1987    BSO  . Appendectomy    . Tonsillectomy    . Rectocele repair  05/11/2011    Procedure: POSTERIOR REPAIR (RECTOCELE);  Surgeon: Cheri Fowler, MD;  Location: [facility];  Service: Gynecology;  Laterality: N/A;  . Colonoscopy      multiple  . Esophagogastroduodenoscopy      multiple  . Tubal ligation    . Bladder surgery      Tack     There were no vitals taken for this visit.  Visit Diagnosis:  BPPV (benign paroxysmal positional vertigo), right      Subjective Assessment - 04/08/14 1551    Symptoms Pt. states she feels  much better than she did on Monday but is still having dizziness   Pertinent History herniated disc in low back; reports intermittent episodes of vertigo since 1991; vertigo has been rather constant since accident on 12-19-13   Patient Stated Goals resolve the vertigo   Currently in Pain? No/denies     Canalith repositioning maneuver x 3 reps for R BPPV - appears to be cupulolithiasis rather than canalithiasis due to  Prolonged duration of nystagmus and delayed onset Discussed etiology of BPPV and difference in canalithiasis and cupulolithiasis - reviewed Brandt-Daroff exercises - Pt. Verbalized understanding         Vestibular Assessment - 04/08/14 1552    Positional Testing   Dix-Hallpike Dix-Hallpike Right   Dix-Hallpike Right   Dix-Hallpike Right Duration 35 secs  delayed onset   Dix-Hallpike Right Symptoms Upbeat, right rotatory nystagmus                        PT Short Term Goals - 04/06/14 1136    PT SHORT TERM GOAL #1   Title same as LTG's           PT Long Term Goals -  04/06/14 1136    PT LONG TERM GOAL #1   Title Pt. will have a negative R Dix-Hallpike to indicate that R BPPV has resolved.   Time 4   Period Weeks   Status New   PT LONG TERM GOAL #2   Title Pt. will report no vertigo with bed mobility or ambulation.   Time 4   Period Weeks   Status New   PT LONG TERM GOAL #3   Title Independent in HEP for habituation of BPPV - Brandt-Daroff exercises.   Time 4   Period Weeks   Status New               Plan - 04/08/14 1556    Clinical Impression Statement Pt. appears to have R cupulolithiasis BPPV as nystagmus had a delayed onset with ong duration (approx. 35 secs) in Dix-Hallpike position today; improved on rep #3 of Epley's but does not seem to be fully resolved   Pt will benefit from skilled therapeutic intervention in order to improve on the following deficits Decreased balance;Decreased mobility   Rehab Potential Good   PT  Frequency 2x / week   PT Duration 4 weeks   PT Treatment/Interventions ADLs/Self Care Home Management;Therapeutic activities;Patient/family education;Therapeutic exercise;Neuromuscular re-education;Balance training;Other (comment)   PT Next Visit Plan recheck R Dix-Hallpike;  Epley's maneuver for R BPPV   PT Home Exercise Plan Brandt-Daroff   Consulted and Agree with Plan of Care Patient        Problem List Patient Active Problem List   Diagnosis Date Noted  . Fall 12/22/2013  . Concussion 12/22/2013  . Lumbar transverse process fracture 12/22/2013  . Acute blood loss anemia 12/22/2013  . Migraine 12/22/2013  . DM (diabetes mellitus) 12/22/2013  . Fracture of multiple ribs of right side 12/19/2013  . GERD (gastroesophageal reflux disease) 08/03/2011  . Gastroparesis 08/03/2011  . Anxiety 08/03/2011  . Rectocele 05/12/2011  . 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 COLONIC POLYPS, ADENOMATOUS, HX OF 05/02/2007    Alda Lea, PT 04/08/2014, 4:09 PM  [facility] [address] [place], [place], [address] Phone: [phone]   Fax:  [phone]

## 2014-04-15 ENCOUNTER — Ambulatory Visit: Payer: Commercial Managed Care - HMO | Admitting: Physical Therapy

## 2014-04-15 DIAGNOSIS — Z9181 History of falling: Secondary | ICD-10-CM | POA: Diagnosis not present

## 2014-04-15 DIAGNOSIS — H8111 Benign paroxysmal vertigo, right ear: Secondary | ICD-10-CM

## 2014-04-16 ENCOUNTER — Encounter: Payer: Self-pay | Admitting: Physical Therapy

## 2014-04-16 NOTE — Therapy (Signed)
[facility] [address] [place], [place], [address] Phone: [phone]   Fax:  [phone]  Physical Therapy Treatment  Patient Details  Name: Valerie Wood MRN: [id number] Date of Birth: 1945/05/18 Referring Provider:  Mayra Neer, MD  Encounter Date: 04/15/2014      PT End of Session - 04/16/14 1126    Visit Number 3  G3   Number of Visits 8   Date for PT Re-Evaluation 05/05/14   Authorization Type [employer]   PT Start Time 9767   PT Stop Time 1228   PT Time Calculation (min) 43 min      Past Medical History  Diagnosis Date  . Hypertension   . Shortness of breath     quit smoking 12 weeks ago  . Anxiety   . GERD (gastroesophageal reflux disease)   . Diabetes mellitus     pt didn't divulge DM during PAT interview but Dr. Paulene Floor nurse states that pt is type 2 diabetic, controlled by diet.  . Gastroparesis due to DM   . Hyperlipidemia   . Fibromyalgia   . Thyroid nodule   . Seasonal allergies   . Migraine   . Tobacco dependence   . Osteopenia   . Stricture and stenosis of esophagus   . Duodenitis without mention of hemorrhage   . 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 Esophagitis, unspecified   . Hx of colonic polyps   . Uterine cancer 1989    Past Surgical History  Procedure Laterality Date  . Cholecystectomy    . Abdominal hysterectomy  12/1987    BSO  . Appendectomy    . Tonsillectomy    . Rectocele repair  05/11/2011    Procedure: POSTERIOR REPAIR (RECTOCELE);  Surgeon: Cheri Fowler, MD;  Location: [facility];  Service: Gynecology;  Laterality: N/A;  . Colonoscopy      multiple  . Esophagogastroduodenoscopy      multiple  . Tubal ligation    . Bladder surgery      Tack     There were no vitals taken for this visit.  Visit Diagnosis:  BPPV (benign paroxysmal positional vertigo), right      Subjective Assessment - 04/16/14 1125    Symptoms "the dizziness is  gone"     "I feel so much better"   Pertinent History herniated disc in low back; reports intermittent episodes of vertigo since 1991; vertigo has been rather constant since accident on 12-19-13   Patient Stated Goals resolve the vertigo   Currently in Pain? No/denies      Canalith Repositioning Maneuver; Epley's maneuver for R BPPV x 1 rep - no nystagmus and no c/o vertigo in any position of Epley's; pt. states she feels much better and has not had any vertigo since last treatment session   Self care; Discussed Brandt-Daroff exercises and informed pt. Of sidelying test to determine involved side of BPPV Pt. Verbalized understanding Pt. Requests to be placed on hold for 30 days to determine if BPPV stays resolved or re-occurs - so that she may be seen For treatment without having to obtain a new order from MD                    PT Short Term Goals - 04/06/14 1136    PT SHORT TERM GOAL #1   Title same as LTG's           PT Long Term Goals -  04/16/14 1132    PT LONG TERM GOAL #1   Title Pt. will have a negative R Dix-Hallpike to indicate that R BPPV has resolved.   Baseline met 04-15-14   Status Achieved   PT LONG TERM GOAL #2   Title Pt. will report no vertigo with bed mobility or ambulation.   Baseline met 04-15-14   Status Achieved   PT LONG TERM GOAL #3   Title Independent in HEP for habituation of BPPV - Brandt-Daroff exercises.   Baseline met 04-15-14   Status Achieved               Plan - 04/16/14 1128    Clinical Impression Statement R BPPV appears to be fully resolved as no nystagmus observed in any position of Epley's maneuver; pt. had no c/o vertigo during Epley's maneuver   Pt will benefit from skilled therapeutic intervention in order to improve on the following deficits Decreased balance;Decreased mobility   Rehab Potential Good   PT Frequency 2x / week   PT Duration 4 weeks   PT Treatment/Interventions ADLs/Self Care Home  Management;Therapeutic activities;Patient/family education;Therapeutic exercise;Neuromuscular re-education;Balance training;Other (comment)   PT Next Visit Plan on hold for 30 days to determine if vertigo stays resolved; pt. instructed to call for appt. if she has another episode within next 30 days   PT [place] as needed if vertigo re-occurs   Consulted and Agree with Plan of Care Patient        Problem List Patient Active Problem List   Diagnosis Date Noted  . Fall 12/22/2013  . Concussion 12/22/2013  . Lumbar transverse process fracture 12/22/2013  . Acute blood loss anemia 12/22/2013  . Migraine 12/22/2013  . DM (diabetes mellitus) 12/22/2013  . Fracture of multiple ribs of right side 12/19/2013  . GERD (gastroesophageal reflux disease) 08/03/2011  . Gastroparesis 08/03/2011  . Anxiety 08/03/2011  . Rectocele 05/12/2011  . 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 COLONIC POLYPS, ADENOMATOUS, HX OF 05/02/2007    Alda Lea, PT 04/16/2014, 11:35 AM  [facility] [address] [place], [place], [address] Phone: [phone]   Fax:  [phone]

## 2014-04-20 ENCOUNTER — Ambulatory Visit: Payer: Commercial Managed Care - HMO | Admitting: Physical Therapy

## 2014-04-21 ENCOUNTER — Ambulatory Visit: Payer: Commercial Managed Care - HMO | Admitting: Physical Therapy

## 2014-04-21 DIAGNOSIS — R0981 Nasal congestion: Secondary | ICD-10-CM | POA: Diagnosis not present

## 2014-04-21 DIAGNOSIS — J309 Allergic rhinitis, unspecified: Secondary | ICD-10-CM | POA: Diagnosis not present

## 2014-04-28 ENCOUNTER — Inpatient Hospital Stay (HOSPITAL_COMMUNITY)
Admission: EM | Admit: 2014-04-28 | Discharge: 2014-04-30 | DRG: 282 | Disposition: A | Discharge status: Home / Self Care | Payer: Commercial Managed Care - HMO | Attending: Internal Medicine | Admitting: Internal Medicine

## 2014-04-28 ENCOUNTER — Emergency Department (HOSPITAL_COMMUNITY): Payer: Commercial Managed Care - HMO

## 2014-04-28 ENCOUNTER — Encounter (HOSPITAL_COMMUNITY): Payer: Self-pay | Admitting: Emergency Medicine

## 2014-04-28 DIAGNOSIS — M797 Fibromyalgia: Secondary | ICD-10-CM | POA: Diagnosis present

## 2014-04-28 DIAGNOSIS — K210 Gastro-esophageal reflux disease with esophagitis: Secondary | ICD-10-CM

## 2014-04-28 DIAGNOSIS — Z8542 Personal history of malignant neoplasm of other parts of uterus: Secondary | ICD-10-CM | POA: Diagnosis not present

## 2014-04-28 DIAGNOSIS — G43909 Migraine, unspecified, not intractable, without status migrainosus: Secondary | ICD-10-CM | POA: Diagnosis present

## 2014-04-28 DIAGNOSIS — E785 Hyperlipidemia, unspecified: Secondary | ICD-10-CM | POA: Diagnosis present

## 2014-04-28 DIAGNOSIS — K3184 Gastroparesis: Secondary | ICD-10-CM | POA: Diagnosis present

## 2014-04-28 DIAGNOSIS — R739 Hyperglycemia, unspecified: Secondary | ICD-10-CM | POA: Insufficient documentation

## 2014-04-28 DIAGNOSIS — E119 Type 2 diabetes mellitus without complications: Secondary | ICD-10-CM | POA: Diagnosis not present

## 2014-04-28 DIAGNOSIS — I2510 Atherosclerotic heart disease of native coronary artery without angina pectoris: Secondary | ICD-10-CM | POA: Diagnosis not present

## 2014-04-28 DIAGNOSIS — I209 Angina pectoris, unspecified: Secondary | ICD-10-CM | POA: Diagnosis not present

## 2014-04-28 DIAGNOSIS — M8580 Other specified disorders of bone density and structure, unspecified site: Secondary | ICD-10-CM | POA: Diagnosis present

## 2014-04-28 DIAGNOSIS — Z794 Long term (current) use of insulin: Secondary | ICD-10-CM

## 2014-04-28 DIAGNOSIS — R0602 Shortness of breath: Secondary | ICD-10-CM | POA: Diagnosis not present

## 2014-04-28 DIAGNOSIS — Z87891 Personal history of nicotine dependence: Secondary | ICD-10-CM

## 2014-04-28 DIAGNOSIS — F419 Anxiety disorder, unspecified: Secondary | ICD-10-CM | POA: Diagnosis not present

## 2014-04-28 DIAGNOSIS — I10 Essential (primary) hypertension: Secondary | ICD-10-CM | POA: Diagnosis present

## 2014-04-28 DIAGNOSIS — E1165 Type 2 diabetes mellitus with hyperglycemia: Secondary | ICD-10-CM | POA: Diagnosis present

## 2014-04-28 DIAGNOSIS — R079 Chest pain, unspecified: Secondary | ICD-10-CM | POA: Diagnosis not present

## 2014-04-28 DIAGNOSIS — K5900 Constipation, unspecified: Secondary | ICD-10-CM | POA: Diagnosis present

## 2014-04-28 DIAGNOSIS — I25110 Atherosclerotic heart disease of native coronary artery with unstable angina pectoris: Secondary | ICD-10-CM | POA: Diagnosis not present

## 2014-04-28 DIAGNOSIS — K219 Gastro-esophageal reflux disease without esophagitis: Secondary | ICD-10-CM | POA: Diagnosis present

## 2014-04-28 DIAGNOSIS — Z886 Allergy status to analgesic agent status: Secondary | ICD-10-CM

## 2014-04-28 DIAGNOSIS — Z9049 Acquired absence of other specified parts of digestive tract: Secondary | ICD-10-CM | POA: Diagnosis present

## 2014-04-28 DIAGNOSIS — Z90710 Acquired absence of both cervix and uterus: Secondary | ICD-10-CM

## 2014-04-28 DIAGNOSIS — Z86010 Personal history of colonic polyps: Secondary | ICD-10-CM

## 2014-04-28 DIAGNOSIS — I214 Non-ST elevation (NSTEMI) myocardial infarction: Secondary | ICD-10-CM | POA: Diagnosis not present

## 2014-04-28 DIAGNOSIS — G43109 Migraine with aura, not intractable, without status migrainosus: Secondary | ICD-10-CM | POA: Diagnosis present

## 2014-04-28 DIAGNOSIS — R0789 Other chest pain: Secondary | ICD-10-CM | POA: Diagnosis not present

## 2014-04-28 LAB — HEPATIC FUNCTION PANEL
ALBUMIN: 3.9 g/dL (ref 3.5–5.2)
ALK PHOS: 108 U/L (ref 39–117)
ALT: 35 U/L (ref 0–35)
AST: 33 U/L (ref 0–37)
BILIRUBIN TOTAL: 0.6 mg/dL (ref 0.3–1.2)
Bilirubin, Direct: 0.2 mg/dL (ref 0.0–0.5)
Indirect Bilirubin: 0.4 mg/dL (ref 0.3–0.9)
TOTAL PROTEIN: 6.8 g/dL (ref 6.0–8.3)

## 2014-04-28 LAB — BASIC METABOLIC PANEL
Anion gap: 11 (ref 5–15)
BUN: 10 mg/dL (ref 6–23)
CALCIUM: 10.1 mg/dL (ref 8.4–10.5)
CO2: 23 mmol/L (ref 19–32)
CREATININE: 0.75 mg/dL (ref 0.50–1.10)
Chloride: 99 mmol/L (ref 96–112)
GFR calc Af Amer: >90 mL/min (ref >90)
GFR, EST NON AFRICAN AMERICAN: 85 mL/min — AB (ref >90)
GLUCOSE: 390 mg/dL — AB (ref 70–99)
Potassium: 4.2 mmol/L (ref 3.5–5.1)
SODIUM: 133 mmol/L — AB (ref 135–145)

## 2014-04-28 LAB — CBC
HCT: 41 % (ref 36.0–46.0)
Hemoglobin: 14 g/dL (ref 12.0–15.0)
MCH: 29.7 pg (ref 26.0–34.0)
MCHC: 34.1 g/dL (ref 30.0–36.0)
MCV: 86.9 fL (ref 78.0–100.0)
Platelets: 224 10*3/uL (ref 150–400)
RBC: 4.72 MIL/uL (ref 3.87–5.11)
RDW: 13.2 % (ref 11.5–15.5)
WBC: 6.9 10*3/uL (ref 4.0–10.5)

## 2014-04-28 LAB — I-STAT TROPONIN, ED: Troponin i, poc: 0 ng/mL (ref 0.00–0.08)

## 2014-04-28 LAB — POCT I-STAT TROPONIN I: Troponin i, poc: 0 ng/mL (ref 0.00–0.08)

## 2014-04-28 LAB — LIPASE, BLOOD: Lipase: 43 U/L (ref 11–59)

## 2014-04-28 MED ORDER — ONDANSETRON HCL 4 MG/2ML IJ SOLN
4.0000 mg | Freq: Once | INTRAMUSCULAR | Status: AC
  Administered 2014-04-28: 4 mg via INTRAVENOUS
  Filled 2014-04-28: qty 2

## 2014-04-28 MED ORDER — ASPIRIN 81 MG PO CHEW
324.0000 mg | CHEWABLE_TABLET | Freq: Once | ORAL | Status: AC
  Administered 2014-04-28: 324 mg via ORAL
  Filled 2014-04-28: qty 4

## 2014-04-28 MED ORDER — INSULIN ASPART 100 UNIT/ML ~~LOC~~ SOLN
8.0000 [IU] | Freq: Once | SUBCUTANEOUS | Status: AC
  Administered 2014-04-28: 8 [IU] via SUBCUTANEOUS
  Filled 2014-04-28: qty 1

## 2014-04-28 MED ORDER — DIPHENHYDRAMINE HCL 50 MG/ML IJ SOLN
25.0000 mg | Freq: Once | INTRAMUSCULAR | Status: AC
  Administered 2014-04-28: 25 mg via INTRAVENOUS

## 2014-04-28 MED ORDER — GI COCKTAIL ~~LOC~~
30.0000 mL | Freq: Once | ORAL | Status: AC
  Administered 2014-04-28: 30 mL via ORAL
  Filled 2014-04-28: qty 30

## 2014-04-28 MED ORDER — NITROGLYCERIN 0.4 MG SL SUBL
0.4000 mg | SUBLINGUAL_TABLET | SUBLINGUAL | Status: DC | PRN
  Administered 2014-04-28 (×2): 0.4 mg via SUBLINGUAL

## 2014-04-28 NOTE — ED Notes (Signed)
Phlebotomy at bedside.

## 2014-04-28 NOTE — H&P (Addendum)
[facility] History and Physical  GENEVE KIMPEL ZOX:[id number] DOB: [birth year] DOA: 04/28/2014  Referring physician: ED physician PCP: Mayra Neer, MD  Specialists:   Chief Complaint: Chest pain   HPI: Valerie Wood is a 69 y.o. female with past medical history hypertension, GERD, hyperlipidemia, diabetes mellitus, anxiety, remote uterine cancer, migraine headaches, who presents with chest pain.  Her chest pain started at about 6 PM, it is located in the substernal area, constant, radiating to left arm and neck. It is associated with shortness of breath and mild nausea. Patient does not have cough. No recent long distant traveling history. No tenderness over calf areas bilaterally. Patient denies fever, chills, cough, abdominal pain, diarrhea, dysuria, urgency, frequency, hematuria, skin rashes or leg swelling. No unilateral weakness, numbness or tingling sensations. No vision change or hearing loss.  In ED, patient was found to have negative troponin, temperature 97.2, negative lipase, WBC 6.7, electrolytes okay. Chest x-ray is negative for acute abnormalities. EKG showed poor R-wave progression, LAD and LVH. I Called Cardiology PA in AM for possible stress test.   Review of Systems: As presented in the history of presenting illness, rest negative.  Where does patient live?  At home Can patient participate in ADLs? Yes  Allergy:  Allergies  Allergen Reactions  . Invokana [Canagliflozin] Itching and Nausea And Vomiting  . Macrolides And Ketolides Nausea And Vomiting  . Morphine And Related Nausea And Vomiting  . Tetracyclines & Related Itching and Nausea And Vomiting  . Tramadol Nausea And Vomiting    Past Medical History  Diagnosis Date  . Hypertension   . Shortness of breath     quit smoking 12 weeks ago  . Anxiety   . GERD (gastroesophageal reflux disease)   . Diabetes mellitus     pt didn't divulge DM during PAT interview but Dr. Paulene Floor nurse states that pt is  type 2 diabetic, controlled by diet.  . Gastroparesis due to DM   . Hyperlipidemia   . Fibromyalgia   . Thyroid nodule   . Seasonal allergies   . Migraine   . Tobacco dependence   . Osteopenia   . Stricture and stenosis of esophagus   . Duodenitis without mention of hemorrhage   . 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 Esophagitis, unspecified   . Hx of colonic polyps   . Uterine cancer 1989    Past Surgical History  Procedure Laterality Date  . Cholecystectomy    . Abdominal hysterectomy  12/1987    BSO  . Appendectomy    . Tonsillectomy    . Rectocele repair  05/11/2011    Procedure: POSTERIOR REPAIR (RECTOCELE);  Surgeon: Cheri Fowler, MD;  Location: [facility];  Service: Gynecology;  Laterality: N/A;  . Colonoscopy      multiple  . Esophagogastroduodenoscopy      multiple  . Tubal ligation    . Bladder surgery      Tack     Social History:  reports that she quit smoking about 3 years ago. Her smoking use included Cigarettes. She smoked 1.00 pack per day. She has never used smokeless tobacco. She reports that she does not drink alcohol or use illicit drugs.  Family History:  Family History  Problem Relation Age of Onset  . Colon cancer Father 30  . Heart disease Mother     MI, CVA     Prior to Admission medications   Medication Sig Start Date End Date Taking? Authorizing Provider  aspirin EC 81 MG tablet Take 81 mg by mouth daily.   Yes Historical Provider, MD  atorvastatin (LIPITOR) 40 MG tablet Take 40 mg by mouth at bedtime.    Yes Historical Provider, MD  azelastine (OPTIVAR) 0.05 % ophthalmic solution Place 1 drop into both eyes daily. 04/22/14  Yes Historical Provider, MD  budesonide (RHINOCORT AQUA) 32 MCG/ACT nasal spray Place 1 spray into both nostrils daily as needed for rhinitis.   Yes Historical Provider, MD  budesonide-formoterol (SYMBICORT) 160-4.5 MCG/ACT inhaler Inhale 2 puffs into the lungs 2 (two) times daily as needed (for shortness of  breath).    Yes Historical Provider, MD  cetirizine (ZYRTEC) 10 MG tablet Take 10 mg by mouth at bedtime.    Yes Historical Provider, MD  cholecalciferol (VITAMIN D) 1000 UNITS tablet Take 1,000 Units by mouth daily.   Yes Historical Provider, MD  guaiFENesin 200 MG tablet Take 1 tablet (200 mg total) by mouth every 6 (six) hours as needed for cough or to loosen phlegm. 12/23/13  Yes Megan N Dort, PA-C  Insulin Detemir (LEVEMIR FLEXTOUCH) 100 UNIT/ML Pen Inject 48 Units into the skin at bedtime.    Yes Historical Provider, MD  losartan (COZAAR) 50 MG tablet Take 50 mg by mouth at bedtime.    Yes Historical Provider, MD  Omega-3 Fatty Acids (FISH OIL PO) Take 1 capsule by mouth daily.   Yes Historical Provider, MD  omeprazole (PRILOSEC) 20 MG capsule Take 20 mg by mouth at bedtime.    Yes Historical Provider, MD  bisacodyl (DULCOLAX) 10 MG suppository Place 1 suppository (10 mg total) rectally daily as needed for moderate constipation or severe constipation. 12/23/13   Megan N Dort, PA-C  docusate sodium 100 MG CAPS Take 100 mg by mouth 2 (two) times daily. 12/23/13   Megan N Dort, PA-C  SUMAtriptan (IMITREX) 100 MG tablet Take 100 mg by mouth every 2 (two) hours as needed for migraine.    Historical Provider, MD  traMADol (ULTRAM) 50 MG tablet Take 1-2 tablets (50-100 mg total) by mouth every 6 (six) hours as needed (50mg  for mild pain, 75mg  for moderate pain, 100mg  for severe pain). 12/23/13   Holland Commons, PA-C    Physical Exam: Filed Vitals:   04/28/14 2045 04/28/14 2100 04/28/14 2115 04/28/14 2245  BP: 115/60 119/61 110/59 156/70  Pulse: 87 80 89 77  Temp:      TempSrc:      Resp: 16 16    Height:      Weight:      SpO2: 94% 95% 92% 98%   General: Not in acute distress HEENT:       Eyes: PERRL, EOMI, no scleral icterus       ENT: No discharge from the ears and nose, no pharynx injection, no tonsillar enlargement.        Neck: No JVD, no bruit, no mass felt. Cardiac: S1/S2, RRR, No  murmurs, No gallops or rubs Pulm: Good air movement bilaterally. Clear to auscultation bilaterally. No rales, wheezing, rhonchi or rubs. There is mild tenderness over front chest wall upon compression. Abd: Soft, nondistended, nontender, no rebound pain, no organomegaly, BS present Ext: No edema bilaterally. 2+DP/PT pulse bilaterally Musculoskeletal: No joint deformities, erythema, or stiffness, ROM full Skin: No rashes.  Neuro: Alert and oriented X3, cranial nerves II-XII grossly intact, muscle strength 5/5 in all extremeties, sensation to light touch intact.  Psych: Patient is not psychotic, no suicidal or hemocidal ideation.  Labs  on Admission:  Basic Metabolic Panel:  Recent Labs Lab 04/28/14 1933  NA 133*  K 4.2  CL 99  CO2 23  GLUCOSE 390*  BUN 10  CREATININE 0.75  CALCIUM 10.1   Liver Function Tests:  Recent Labs Lab 04/28/14 1933  AST 33  ALT 35  ALKPHOS 108  BILITOT 0.6  PROT 6.8  ALBUMIN 3.9    Recent Labs Lab 04/28/14 1933  LIPASE 43   No results for input(s): AMMONIA in the last 168 hours. CBC:  Recent Labs Lab 04/28/14 1933  WBC 6.9  HGB 14.0  HCT 41.0  MCV 86.9  PLT 224   Cardiac Enzymes: No results for input(s): CKTOTAL, CKMB, CKMBINDEX, TROPONINI in the last 168 hours.  BNP (last 3 results) No results for input(s): BNP in the last 8760 hours.  ProBNP (last 3 results) No results for input(s): PROBNP in the last 8760 hours.  CBG: No results for input(s): GLUCAP in the last 168 hours.  Radiological Exams on Admission: Dg Chest 2 View  04/28/2014   CLINICAL DATA:  Chest pain for 1 day with shortness of breath. Pain across entire chest radiating into left arm.  EXAM: CHEST  2 VIEW  COMPARISON:  12/20/2013  FINDINGS: The lungs remain mildly hyperinflated with prominent interstitial markings, similar to prior exam. The cardiomediastinal contours are normal. Pulmonary vasculature is normal. No consolidation, pleural effusion, or  pneumothorax. There are old posterior right rib fractures. No acute osseous abnormalities are seen.  IMPRESSION: No acute pulmonary process.   Electronically Signed   By: Jeb Levering M.D.   On: 04/28/2014 21:44    EKG: Independently reviewed. poor R-wave progression, LAD and LVH.  Assessment/Plan Principal Problem:   Chest pain Active Problems:   GERD (gastroesophageal reflux disease)   Gastroparesis   Anxiety   Migraine   Diabetes mellitus without complication   Essential hypertension   HLD (hyperlipidemia)  Chest pain: Given her significant risk factors, including hypertension, GERD, hyperlipidemia, diabetes and old age, it is important to rule out ACS. Other DD include, but less likely, aortic dissection (no typical tearing chest pain, chest x-ray has no mediastinal widening), PE (patient does not have tachycardia, oxygen saturation is 97.2% at room air, Well's score is low probability), pneumothorax (negative chest x-ray), esophageal perforation (no recent history of endoscopy) , pneumonia (no leukocytosis, fever, and a negative chest x-ray), GERD on Prilosec at home) and anxiety.   - will admit to Tele bed  - cycle CE q6 x3 and repeat her EKG in the am  - Nitroglycerin, dilaudid, aspirin, lipitor - start coreg at 3.125 mg bid  - Consider cardiology consult if test positive for CEs  - 2d echo   Diabetes mellitus: A1c was 11.1 on 12/19/13. Patient's on Levemir 48 units daily at home. -Decrease Levemir dose from 48 to 40 units daily  -Sliding-scale insulin  Hyperlipidemia: No LDL record -Continue Lipitor and fish oil -Check FLP  Hypertension: -continue Cozaar  GERD: -Protonix  Constipation: -Continue home medications: Dulcolax, docusate   DVT ppx: SQ Heparin        Code Status: Full code Family Communication:  Yes, patient's   daughter    at bed side Disposition Plan: Admit to inpatient   Date of Service 04/28/2014    Ivor Costa [facility] Pager  [phone]  If 7PM-7AM, please contact night-coverage [email or web address] Password [facility] 04/28/2014, 11:21 PM

## 2014-04-28 NOTE — ED Notes (Signed)
Patient here with chest pain which began about 1.5 hours ago. States never had pain like this before. Additionally reports nausea, Shortness of breath, back pain, left arm pain. Hx: DM2, HLD, HTN.

## 2014-04-28 NOTE — ED Provider Notes (Signed)
CSN: [id number]     Arrival date & time 04/28/14  1914 History   First MD Initiated Contact with Patient 04/28/14 1940     Chief Complaint  Patient presents with  . Chest Pain     Patient is a 69 y.o. female presenting with chest pain. The history is provided by the patient. No language interpreter was used.  Chest Pain  Ms. Brester presents for evaluation of chest pain. At 6 PM she was eating steak and french fries and developed chest pain is located in the central chest and radiates to her left arm and her back. The pain is described as a tightness sensation. She has associated nausea, there is no vomiting, diaphoresis, abdominal pain, diarrhea, leg swelling or pain. She does endorse shortness of breath. Her symptoms have improved overall but she does have persistent tightness and nausea in her chest. She has similar symptoms previously that were much milder nature. She has a history of high blood pressure, diabetes, high cholesterol. Patient denies any history of cardiac disease and has no prior heart cath. She has had a stress test last 2 years. Pain is waxing and waning in nature but there are no clear alleviating or worsening factors.  Past Medical History  Diagnosis Date  . Hypertension   . Shortness of breath     quit smoking 12 weeks ago  . Anxiety   . GERD (gastroesophageal reflux disease)   . Diabetes mellitus     pt didn't divulge DM during PAT interview but Dr. Paulene Floor nurse states that pt is type 2 diabetic, controlled by diet.  . Gastroparesis due to DM   . Hyperlipidemia   . Fibromyalgia   . Thyroid nodule   . Seasonal allergies   . Migraine   . Tobacco dependence   . Osteopenia   . Stricture and stenosis of esophagus   . Duodenitis without mention of hemorrhage   . 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 Esophagitis, unspecified   . Hx of colonic polyps   . Uterine cancer 1989   Past Surgical History  Procedure Laterality Date  . Cholecystectomy     . Abdominal hysterectomy  12/1987    BSO  . Appendectomy    . Tonsillectomy    . Rectocele repair  05/11/2011    Procedure: POSTERIOR REPAIR (RECTOCELE);  Surgeon: Cheri Fowler, MD;  Location: [facility];  Service: Gynecology;  Laterality: N/A;  . Colonoscopy      multiple  . Esophagogastroduodenoscopy      multiple  . Tubal ligation    . Bladder surgery      Tack    Family History  Problem Relation Age of Onset  . Colon cancer Father 68  . Heart disease Mother     MI, CVA   History  Substance Use Topics  . Smoking status: Former Smoker -- 1.00 packs/day    Types: Cigarettes    Quit date: 02/22/2011  . Smokeless tobacco: Never Used  . Alcohol Use: No   OB History    No data available     Review of Systems  Cardiovascular: Positive for chest pain.  All other systems reviewed and are negative.     Allergies  Invokana; Morphine and related; Other; and Tetracyclines & related  Home Medications   Prior to Admission medications   Medication Sig Start Date End Date Taking? Authorizing Provider  aspirin EC 81 MG tablet Take 81 mg by mouth daily.    Historical  Provider, MD  atorvastatin (LIPITOR) 40 MG tablet Take 40 mg by mouth at bedtime.     Historical Provider, MD  bisacodyl (DULCOLAX) 10 MG suppository Place 1 suppository (10 mg total) rectally daily as needed for moderate constipation or severe constipation. 12/23/13   Megan N Dort, PA-C  budesonide-formoterol (SYMBICORT) 160-4.5 MCG/ACT inhaler Inhale 2 puffs into the lungs 2 (two) times daily.    Historical Provider, MD  cetirizine (ZYRTEC) 10 MG tablet Take 10 mg by mouth at bedtime.     Historical Provider, MD  cholecalciferol (VITAMIN D) 1000 UNITS tablet Take 1,000 Units by mouth daily.    Historical Provider, MD  docusate sodium 100 MG CAPS Take 100 mg by mouth 2 (two) times daily. 12/23/13   Megan N Dort, PA-C  guaiFENesin 200 MG tablet Take 1 tablet (200 mg total) by mouth every 6 (six) hours as needed for  cough or to loosen phlegm. 12/23/13   Megan N Dort, PA-C  ibuprofen (ADVIL,MOTRIN) 200 MG tablet Take 400 mg by mouth daily as needed (pain).    Historical Provider, MD  Insulin Detemir (LEVEMIR FLEXTOUCH) 100 UNIT/ML Pen Inject 26 Units into the skin at bedtime.    Historical Provider, MD  losartan (COZAAR) 50 MG tablet Take 50 mg by mouth at bedtime.     Historical Provider, MD  meclizine (ANTIVERT) 25 MG tablet Take 1 tablet (25 mg total) by mouth 3 (three) times daily. 12/23/13   Megan N Dort, PA-C  Omega-3 Fatty Acids (FISH OIL PO) Take 1 capsule by mouth daily.    Historical Provider, MD  omeprazole (PRILOSEC) 20 MG capsule Take 20 mg by mouth at bedtime.     Historical Provider, MD  polyethylene glycol (MIRALAX / GLYCOLAX) packet Take 17 g by mouth daily as needed (constipation). Mix in coffee and drink    Historical Provider, MD  SUMAtriptan (IMITREX) 100 MG tablet Take 100 mg by mouth every 2 (two) hours as needed for migraine.    Historical Provider, MD  traMADol (ULTRAM) 50 MG tablet Take 1-2 tablets (50-100 mg total) by mouth every 6 (six) hours as needed (50mg  for mild pain, 75mg  for moderate pain, 100mg  for severe pain). 12/23/13   Megan N Dort, PA-C   BP 163/83 mmHg  Pulse 80  Temp(Src) 97.4 F (36.3 C) (Oral)  Resp 16  Ht 5\' 3"  (1.6 m)  Wt 159 lb (72.122 kg)  BMI 28.17 kg/m2  SpO2 95% Physical Exam  Constitutional: She is oriented to person, place, and time. She appears well-developed and well-nourished.  HENT:  Head: Normocephalic and atraumatic.  Cardiovascular: Normal rate and regular rhythm.   No murmur heard. Pulmonary/Chest: Effort normal and breath sounds normal. No respiratory distress.  Abdominal: Soft. There is no tenderness. There is no rebound and no guarding.  Musculoskeletal: She exhibits no edema or tenderness.  Neurological: She is alert and oriented to person, place, and time.  Skin: Skin is warm and dry.  Psychiatric: She has a normal mood and affect.  Her behavior is normal.  Nursing note and vitals reviewed.   ED Course  Procedures (including critical care time) Labs Review Labs Reviewed  BASIC METABOLIC PANEL - Abnormal; Notable for the following:    Sodium 133 (*)    Glucose, Bld 390 (*)    GFR calc non Af Amer 85 (*)    All other components within normal limits  CBC  HEPATIC FUNCTION PANEL  LIPASE, BLOOD  I-STAT TROPOININ, ED  I-STAT  TROPOININ, ED    Imaging Review Dg Chest 2 View  04/28/2014   CLINICAL DATA:  Chest pain for 1 day with shortness of breath. Pain across entire chest radiating into left arm.  EXAM: CHEST  2 VIEW  COMPARISON:  12/20/2013  FINDINGS: The lungs remain mildly hyperinflated with prominent interstitial markings, similar to prior exam. The cardiomediastinal contours are normal. Pulmonary vasculature is normal. No consolidation, pleural effusion, or pneumothorax. There are old posterior right rib fractures. No acute osseous abnormalities are seen.  IMPRESSION: No acute pulmonary process.   Electronically Signed   By: Jeb Levering M.D.   On: 04/28/2014 21:44     EKG Interpretation   Date/Time:  Tuesday April 28 2014 19:22:39 EDT Ventricular Rate:  78 PR Interval:  182 QRS Duration: 90 QT Interval:  376 QTC Calculation: 428 R Axis:   -23 Text Interpretation:  Normal sinus rhythm Moderate voltage criteria for  LVH, may be normal variant Borderline ECG Confirmed by Hazle Coca [phone])  on 04/28/2014 7:44:24 PM      MDM   Final diagnoses:  Chest pain, unspecified chest pain type  Hyperglycemia   Patient presents for evaluation of chest pain following a meal. EKG without any acute ischemic changes. Patient did have improvement in her symptoms after GI cocktail. She is noted to be hyperglycemic but she has not taken her evening insulin and just ate a meal. Discussed with Dr. Radford Pax with cardiology who recommends admission to medicine giving a typical presentation and hyperglycemia. Discussed with  medicine regarding admission for chest pain, cardiac rule out.    Quintella Reichert, MD 04/28/14 2259

## 2014-04-28 NOTE — ED Notes (Signed)
Pt reported her throat felt like it was closing after drinking GI coctail. Pt skin noted to be bright red. PA [facility] informed, order obtained.

## 2014-04-29 ENCOUNTER — Inpatient Hospital Stay (HOSPITAL_COMMUNITY): Payer: Commercial Managed Care - HMO

## 2014-04-29 ENCOUNTER — Encounter (HOSPITAL_COMMUNITY): Payer: Self-pay | Admitting: Cardiovascular Disease

## 2014-04-29 ENCOUNTER — Ambulatory Visit (HOSPITAL_COMMUNITY): Payer: Commercial Managed Care - HMO

## 2014-04-29 DIAGNOSIS — I209 Angina pectoris, unspecified: Secondary | ICD-10-CM

## 2014-04-29 DIAGNOSIS — R079 Chest pain, unspecified: Secondary | ICD-10-CM

## 2014-04-29 LAB — LIPID PANEL
CHOLESTEROL: 123 mg/dL (ref 0–200)
HDL: 31 mg/dL — ABNORMAL LOW (ref >39)
LDL Cholesterol: 54 mg/dL (ref 0–99)
Total CHOL/HDL Ratio: 4 RATIO
Triglycerides: 192 mg/dL — ABNORMAL HIGH (ref <150)
VLDL: 38 mg/dL (ref 0–40)

## 2014-04-29 LAB — CBC
HEMATOCRIT: 40.9 % (ref 36.0–46.0)
HEMOGLOBIN: 13.9 g/dL (ref 12.0–15.0)
MCH: 29.8 pg (ref 26.0–34.0)
MCHC: 34 g/dL (ref 30.0–36.0)
MCV: 87.8 fL (ref 78.0–100.0)
Platelets: 222 10*3/uL (ref 150–400)
RBC: 4.66 MIL/uL (ref 3.87–5.11)
RDW: 13.3 % (ref 11.5–15.5)
WBC: 5.4 10*3/uL (ref 4.0–10.5)

## 2014-04-29 LAB — GLUCOSE, CAPILLARY
GLUCOSE-CAPILLARY: 163 mg/dL — AB (ref 70–99)
GLUCOSE-CAPILLARY: 264 mg/dL — AB (ref 70–99)
Glucose-Capillary: 179 mg/dL — ABNORMAL HIGH (ref 70–99)
Glucose-Capillary: 223 mg/dL — ABNORMAL HIGH (ref 70–99)
Glucose-Capillary: 245 mg/dL — ABNORMAL HIGH (ref 70–99)

## 2014-04-29 LAB — BASIC METABOLIC PANEL
ANION GAP: 7 (ref 5–15)
BUN: 9 mg/dL (ref 6–23)
CO2: 28 mmol/L (ref 19–32)
Calcium: 10.2 mg/dL (ref 8.4–10.5)
Chloride: 103 mmol/L (ref 96–112)
Creatinine, Ser: 0.75 mg/dL (ref 0.50–1.10)
GFR calc Af Amer: >90 mL/min (ref >90)
GFR, EST NON AFRICAN AMERICAN: 85 mL/min — AB (ref >90)
Glucose, Bld: 159 mg/dL — ABNORMAL HIGH (ref 70–99)
Potassium: 4.6 mmol/L (ref 3.5–5.1)
SODIUM: 138 mmol/L (ref 135–145)

## 2014-04-29 LAB — RAPID URINE DRUG SCREEN, HOSP PERFORMED
Amphetamines: NOT DETECTED
BARBITURATES: NOT DETECTED
Benzodiazepines: NOT DETECTED
Cocaine: NOT DETECTED
OPIATES: NOT DETECTED
Tetrahydrocannabinol: NOT DETECTED

## 2014-04-29 LAB — TROPONIN I
Troponin I: 0.12 ng/mL — ABNORMAL HIGH (ref <0.031)
Troponin I: 0.14 ng/mL — ABNORMAL HIGH (ref <0.031)

## 2014-04-29 LAB — PROTIME-INR
INR: 0.96 (ref 0.00–1.49)
Prothrombin Time: 12.9 seconds (ref 11.6–15.2)

## 2014-04-29 MED ORDER — KETOTIFEN FUMARATE 0.025 % OP SOLN
1.0000 [drp] | Freq: Two times a day (BID) | OPHTHALMIC | Status: DC
  Administered 2014-04-29 – 2014-04-30 (×3): 1 [drp] via OPHTHALMIC
  Filled 2014-04-29: qty 5

## 2014-04-29 MED ORDER — INSULIN DETEMIR 100 UNIT/ML ~~LOC~~ SOLN
40.0000 [IU] | Freq: Every day | SUBCUTANEOUS | Status: DC
  Administered 2014-04-29 (×2): 40 [IU] via SUBCUTANEOUS
  Filled 2014-04-29 (×3): qty 0.4

## 2014-04-29 MED ORDER — TECHNETIUM TC 99M SESTAMIBI GENERIC - CARDIOLITE
10.0000 | Freq: Once | INTRAVENOUS | Status: AC | PRN
  Administered 2014-04-29: 10 via INTRAVENOUS

## 2014-04-29 MED ORDER — ACETAMINOPHEN 650 MG RE SUPP: 650.0000 mg | Freq: Four times a day (QID) | RECTAL | Status: DC | PRN

## 2014-04-29 MED ORDER — LORATADINE 10 MG PO TABS
10.0000 mg | ORAL_TABLET | Freq: Every day | ORAL | Status: DC
  Administered 2014-04-29 – 2014-04-30 (×2): 10 mg via ORAL
  Filled 2014-04-29 (×2): qty 1

## 2014-04-29 MED ORDER — PANTOPRAZOLE SODIUM 40 MG PO TBEC
40.0000 mg | DELAYED_RELEASE_TABLET | Freq: Every day | ORAL | Status: DC
  Administered 2014-04-29 – 2014-04-30 (×3): 40 mg via ORAL
  Filled 2014-04-29 (×3): qty 1

## 2014-04-29 MED ORDER — SODIUM CHLORIDE 0.9 % IV SOLN
INTRAVENOUS | Status: DC
  Administered 2014-04-29: via INTRAVENOUS

## 2014-04-29 MED ORDER — LOSARTAN POTASSIUM 50 MG PO TABS
50.0000 mg | ORAL_TABLET | Freq: Every day | ORAL | Status: DC
  Administered 2014-04-29 (×2): 50 mg via ORAL
  Filled 2014-04-29 (×3): qty 1

## 2014-04-29 MED ORDER — GUAIFENESIN 200 MG PO TABS
200.0000 mg | ORAL_TABLET | Freq: Four times a day (QID) | ORAL | Status: DC | PRN
  Filled 2014-04-29: qty 1

## 2014-04-29 MED ORDER — INSULIN ASPART 100 UNIT/ML ~~LOC~~ SOLN
0.0000 [IU] | Freq: Three times a day (TID) | SUBCUTANEOUS | Status: DC
  Administered 2014-04-29: 3 [IU] via SUBCUTANEOUS
  Administered 2014-04-29 (×2): 2 [IU] via SUBCUTANEOUS
  Administered 2014-04-30: 3 [IU] via SUBCUTANEOUS

## 2014-04-29 MED ORDER — INSULIN DETEMIR 100 UNIT/ML FLEXPEN: 40.0000 [IU] | PEN_INJECTOR | Freq: Every day | SUBCUTANEOUS | Status: DC

## 2014-04-29 MED ORDER — SODIUM CHLORIDE 0.9 % IJ SOLN
3.0000 mL | Freq: Two times a day (BID) | INTRAMUSCULAR | Status: DC
  Administered 2014-04-30: 3 mL via INTRAVENOUS

## 2014-04-29 MED ORDER — FLUTICASONE PROPIONATE 50 MCG/ACT NA SUSP
1.0000 | Freq: Every day | NASAL | Status: DC
  Administered 2014-04-29 – 2014-04-30 (×2): 1 via NASAL
  Filled 2014-04-29: qty 16

## 2014-04-29 MED ORDER — OMEGA-3-ACID ETHYL ESTERS 1 G PO CAPS
1.0000 g | ORAL_CAPSULE | Freq: Every day | ORAL | Status: DC
  Administered 2014-04-29 – 2014-04-30 (×2): 1 g via ORAL
  Filled 2014-04-29 (×3): qty 1

## 2014-04-29 MED ORDER — SODIUM CHLORIDE 0.9 % IJ SOLN
3.0000 mL | Freq: Two times a day (BID) | INTRAMUSCULAR | Status: DC
  Administered 2014-04-29 – 2014-04-30 (×4): 3 mL via INTRAVENOUS

## 2014-04-29 MED ORDER — ONDANSETRON HCL 4 MG/2ML IJ SOLN
4.0000 mg | Freq: Three times a day (TID) | INTRAMUSCULAR | Status: DC | PRN
  Administered 2014-04-29 – 2014-04-30 (×2): 4 mg via INTRAVENOUS
  Filled 2014-04-29 (×2): qty 2

## 2014-04-29 MED ORDER — BUDESONIDE-FORMOTEROL FUMARATE 160-4.5 MCG/ACT IN AERO: 2.0000 | INHALATION_SPRAY | Freq: Two times a day (BID) | RESPIRATORY_TRACT | Status: DC | PRN

## 2014-04-29 MED ORDER — HYDROMORPHONE HCL 1 MG/ML IJ SOLN: 0.5000 mg | INTRAMUSCULAR | Status: DC | PRN

## 2014-04-29 MED ORDER — ASPIRIN EC 81 MG PO TBEC: 81.0000 mg | DELAYED_RELEASE_TABLET | Freq: Every day | ORAL | Status: DC

## 2014-04-29 MED ORDER — TECHNETIUM TC 99M SESTAMIBI GENERIC - CARDIOLITE
30.0000 | Freq: Once | INTRAVENOUS | Status: AC | PRN
  Administered 2014-04-29: 30 via INTRAVENOUS

## 2014-04-29 MED ORDER — HEPARIN SODIUM (PORCINE) 5000 UNIT/ML IJ SOLN
5000.0000 [IU] | Freq: Three times a day (TID) | INTRAMUSCULAR | Status: DC
  Administered 2014-04-29 – 2014-04-30 (×4): 5000 [IU] via SUBCUTANEOUS
  Filled 2014-04-29 (×7): qty 1

## 2014-04-29 MED ORDER — DOCUSATE SODIUM 100 MG PO CAPS
100.0000 mg | ORAL_CAPSULE | Freq: Two times a day (BID) | ORAL | Status: DC
  Administered 2014-04-29 – 2014-04-30 (×2): 100 mg via ORAL
  Filled 2014-04-29 (×5): qty 1

## 2014-04-29 MED ORDER — ASPIRIN EC 81 MG PO TBEC
81.0000 mg | DELAYED_RELEASE_TABLET | Freq: Every day | ORAL | Status: DC
  Administered 2014-04-29: 81 mg via ORAL
  Filled 2014-04-29: qty 1

## 2014-04-29 MED ORDER — VITAMIN D3 25 MCG (1000 UNIT) PO TABS
1000.0000 [IU] | ORAL_TABLET | Freq: Every day | ORAL | Status: DC
  Administered 2014-04-29 – 2014-04-30 (×2): 1000 [IU] via ORAL
  Filled 2014-04-29 (×2): qty 1

## 2014-04-29 MED ORDER — SUMATRIPTAN SUCCINATE 100 MG PO TABS
100.0000 mg | ORAL_TABLET | ORAL | Status: DC | PRN
  Filled 2014-04-29: qty 1

## 2014-04-29 MED ORDER — ACETAMINOPHEN 325 MG PO TABS: 650.0000 mg | ORAL_TABLET | Freq: Four times a day (QID) | ORAL | Status: DC | PRN

## 2014-04-29 MED ORDER — LIVING WELL WITH DIABETES BOOK
Freq: Once | Status: AC
  Administered 2014-04-29: 14:00:00
  Filled 2014-04-29: qty 1

## 2014-04-29 MED ORDER — REGADENOSON 0.4 MG/5ML IV SOLN
INTRAVENOUS | Status: AC
  Filled 2014-04-29: qty 5

## 2014-04-29 MED ORDER — SODIUM CHLORIDE 0.9 % IV SOLN
INTRAVENOUS | Status: AC
  Administered 2014-04-30: 05:00:00 via INTRAVENOUS

## 2014-04-29 MED ORDER — REGADENOSON 0.4 MG/5ML IV SOLN
0.4000 mg | Freq: Once | INTRAVENOUS | Status: DC
  Filled 2014-04-29: qty 5

## 2014-04-29 MED ORDER — BISACODYL 10 MG RE SUPP: 10.0000 mg | Freq: Every day | RECTAL | Status: DC | PRN

## 2014-04-29 MED ORDER — ATORVASTATIN CALCIUM 40 MG PO TABS
40.0000 mg | ORAL_TABLET | Freq: Every day | ORAL | Status: DC
  Administered 2014-04-29 (×2): 40 mg via ORAL
  Filled 2014-04-29 (×3): qty 1

## 2014-04-29 MED ORDER — ASPIRIN 81 MG PO CHEW
81.0000 mg | CHEWABLE_TABLET | ORAL | Status: AC
  Administered 2014-04-30: 81 mg via ORAL
  Filled 2014-04-29: qty 1

## 2014-04-29 MED ORDER — CARVEDILOL 3.125 MG PO TABS
3.1250 mg | ORAL_TABLET | Freq: Two times a day (BID) | ORAL | Status: DC
  Administered 2014-04-29 – 2014-04-30 (×4): 3.125 mg via ORAL
  Filled 2014-04-29 (×5): qty 1

## 2014-04-29 MED ORDER — SODIUM CHLORIDE 0.9 % IJ SOLN: 3.0000 mL | INTRAMUSCULAR | Status: DC | PRN

## 2014-04-29 MED ORDER — TRAMADOL HCL 50 MG PO TABS
50.0000 mg | ORAL_TABLET | Freq: Four times a day (QID) | ORAL | Status: DC | PRN
  Administered 2014-04-30: 50 mg via ORAL
  Filled 2014-04-29: qty 1

## 2014-04-29 MED ORDER — SODIUM CHLORIDE 0.9 % IV SOLN: 250.0000 mL | INTRAVENOUS | Status: DC | PRN

## 2014-04-29 NOTE — Progress Notes (Signed)
OT Cancellation Note  Patient Details Name: Valerie Wood MRN: [id number] DOB: 24-Apr-1945   Cancelled Treatment:    Reason Eval/Treat Not Completed: OT screened, no needs identified, will sign off. Independent in ADL and ADL transfers.  [name] 04/29/2014, 11:22 AM

## 2014-04-29 NOTE — Progress Notes (Signed)
Exercise stress completed without complications.  No chest pain.

## 2014-04-29 NOTE — Discharge Summary (Deleted)
Physician Discharge Summary  Valerie Wood:[id number] DOB: [birth year] DOA: 04/28/2014  PCP: Mayra Neer, MD  Admit date: 04/28/2014 Discharge date: 04/29/2014  Time spent: 45 minutes  Recommendations for Outpatient Follow-up:  1. PCP in 1 week  Discharge Diagnoses:  Principal Problem:   Chest pain Active Problems:   GERD (gastroesophageal reflux disease)   Gastroparesis   Anxiety   Migraine   Diabetes mellitus without complication   Essential hypertension   HLD (hyperlipidemia)   Discharge Condition: stable  Diet recommendation: diabetic  Filed Weights   04/28/14 1925 04/29/14 0012  Weight: 72.122 kg (159 lb) 72.6 kg (160 lb 0.9 oz)    History of present illness:  Valerie Wood is a 69 y.o. female with past medical history hypertension, GERD, hyperlipidemia, diabetes mellitus, anxiety, remote uterine cancer, migraine headaches, who presented to the ER with chest pain, it was located in the substernal area, constant, radiating to left arm and neck. Associated with shortness of breath and mild nausea.  Hospital Course:  1. Chest pain with typical and atypical features -resolved -ruled out for MI with negative cardiac enzymes -seen by cardiology in consultation -due to risk factors underwent Myoview which was negative for inducible ischemia -discharged home in a stable condition  2. DM -continue levemir, further titration per PCP  3. HLD:  -Continue statin.   4. Tobacco abuse -quit smoking  5. HLD:  -Continue statin.  Consultations:  Cardiology  Discharge Exam: Filed Vitals:   04/29/14 1420  BP: 127/70  Pulse: 73  Temp: 97.6 F (36.4 C)  Resp: 20    General: AAOx3 Cardiovascular: S1S2/RRR Respiratory: CTAB  Discharge Instructions   Discharge Instructions    Diet - low sodium heart healthy    Complete by:  As directed      Increase activity slowly    Complete by:  As directed           Current Discharge Medication List     CONTINUE these medications which have NOT CHANGED   Details  aspirin EC 81 MG tablet Take 81 mg by mouth daily.    atorvastatin (LIPITOR) 40 MG tablet Take 40 mg by mouth at bedtime.     azelastine (OPTIVAR) 0.05 % ophthalmic solution Place 1 drop into both eyes daily. Refills: 0    budesonide (RHINOCORT AQUA) 32 MCG/ACT nasal spray Place 1 spray into both nostrils daily as needed for rhinitis.    budesonide-formoterol (SYMBICORT) 160-4.5 MCG/ACT inhaler Inhale 2 puffs into the lungs 2 (two) times daily as needed (for shortness of breath).     cetirizine (ZYRTEC) 10 MG tablet Take 10 mg by mouth at bedtime.     cholecalciferol (VITAMIN D) 1000 UNITS tablet Take 1,000 Units by mouth daily.    guaiFENesin 200 MG tablet Take 1 tablet (200 mg total) by mouth every 6 (six) hours as needed for cough or to loosen phlegm. Qty: 30 suppository, Refills: 0    Insulin Detemir (LEVEMIR FLEXTOUCH) 100 UNIT/ML Pen Inject 48 Units into the skin at bedtime.     losartan (COZAAR) 50 MG tablet Take 50 mg by mouth at bedtime.     Omega-3 Fatty Acids (FISH OIL PO) Take 1 capsule by mouth daily.    omeprazole (PRILOSEC) 20 MG capsule Take 20 mg by mouth at bedtime.     bisacodyl (DULCOLAX) 10 MG suppository Place 1 suppository (10 mg total) rectally daily as needed for moderate constipation or severe constipation. Qty: 12 suppository, Refills: 0  docusate sodium 100 MG CAPS Take 100 mg by mouth 2 (two) times daily. Qty: 10 capsule, Refills: 0    SUMAtriptan (IMITREX) 100 MG tablet Take 100 mg by mouth every 2 (two) hours as needed for migraine.    traMADol (ULTRAM) 50 MG tablet Take 1-2 tablets (50-100 mg total) by mouth every 6 (six) hours as needed (50mg  for mild pain, 75mg  for moderate pain, 100mg  for severe pain). Qty: 40 tablet, Refills: 0       Allergies  Allergen Reactions  . Invokana [Canagliflozin] Itching and Nausea And Vomiting  . Macrolides And Ketolides Nausea And Vomiting  .  Morphine And Related Nausea And Vomiting  . Tetracyclines & Related Itching and Nausea And Vomiting  . Tramadol Nausea And Vomiting   Follow-up Information    Follow up with SHAW,KIMBERLEE, MD. Schedule an appointment as soon as possible for a visit in 1 week.   Specialty:  Family Medicine   Contact information:   [address] [place] [address] [phone]        The results of significant diagnostics from this hospitalization (including imaging, microbiology, ancillary and laboratory) are listed below for reference.    Significant Diagnostic Studies: Dg Chest 2 View  04/28/2014   CLINICAL DATA:  Chest pain for 1 day with shortness of breath. Pain across entire chest radiating into left arm.  EXAM: CHEST  2 VIEW  COMPARISON:  12/20/2013  FINDINGS: The lungs remain mildly hyperinflated with prominent interstitial markings, similar to prior exam. The cardiomediastinal contours are normal. Pulmonary vasculature is normal. No consolidation, pleural effusion, or pneumothorax. There are old posterior right rib fractures. No acute osseous abnormalities are seen.  IMPRESSION: No acute pulmonary process.   Electronically Signed   By: Jeb Levering M.D.   On: 04/28/2014 21:44   Nm Myocar Multi W/spect W/wall Motion / Ef  04/29/2014   CLINICAL DATA:  Chest pain, diabetes, hypertension and coronary artery disease.  EXAM: MYOCARDIAL IMAGING WITH SPECT (REST AND EXERCISE)  GATED LEFT VENTRICULAR WALL MOTION STUDY  LEFT VENTRICULAR EJECTION FRACTION  TECHNIQUE: Standard myocardial SPECT imaging was performed after resting intravenous injection of 10 mCi Tc-53m sestamibi. Subsequently, exercise tolerance test was performed by the patient under the supervision of the Cardiology staff. At peak-stress, 30 mCi Tc-96m sestamibi was injected intravenously and standard myocardial SPECT imaging was performed. Quantitative gated imaging was also performed to evaluate left ventricular wall  motion, and estimate left ventricular ejection fraction.  COMPARISON:  None.  FINDINGS: Perfusion: No decreased activity in the left ventricle on stress imaging to suggest reversible ischemia or infarction.  Wall Motion: Normal left ventricular wall motion. No left ventricular dilation.  Left Ventricular Ejection Fraction: 57 %  End diastolic volume 62 ml  End systolic volume 26 ml  IMPRESSION: 1. No reversible ischemia or infarction.  2. Normal left ventricular wall motion.  3. Left ventricular ejection fraction 57%  4. Low/Intermediate/High-risk stress test findings*.  *2012 Appropriate Use Criteria for Coronary Revascularization Focused Update: J Am Coll Cardiol. 1829;93(7):169-678. http://content.airportbarriers.com.aspx?articleid=1201161   Electronically Signed   By: Marijo Sanes M.D.   On: 04/29/2014 13:36    Microbiology: No results found for this or any previous visit (from the past 240 hour(s)).   Labs: Basic Metabolic Panel:  Recent Labs Lab 04/28/14 1933 04/29/14 0933  NA 133* 138  K 4.2 4.6  CL 99 103  CO2 23 28  GLUCOSE 390* 159*  BUN 10 9  CREATININE 0.75  0.75  CALCIUM 10.1 10.2   Liver Function Tests:  Recent Labs Lab 04/28/14 1933  AST 33  ALT 35  ALKPHOS 108  BILITOT 0.6  PROT 6.8  ALBUMIN 3.9    Recent Labs Lab 04/28/14 1933  LIPASE 43   No results for input(s): AMMONIA in the last 168 hours. CBC:  Recent Labs Lab 04/28/14 1933 04/29/14 0933  WBC 6.9 5.4  HGB 14.0 13.9  HCT 41.0 40.9  MCV 86.9 87.8  PLT 224 222   Cardiac Enzymes:  Recent Labs Lab 04/29/14 0220 04/29/14 0933  TROPONINI <0.03 0.12*   BNP: BNP (last 3 results) No results for input(s): BNP in the last 8760 hours.  ProBNP (last 3 results) No results for input(s): PROBNP in the last 8760 hours.  CBG:  Recent Labs Lab 04/29/14 0044 04/29/14 0613 04/29/14 1326  GLUCAP 264* 179* 223*       Signed:  ,  [facility] 04/29/2014, 3:20  PM

## 2014-04-29 NOTE — Progress Notes (Signed)
PT Cancellation Note  Patient Details Name: Valerie Wood MRN: [id number] DOB: 1945-08-19   Cancelled Treatment:    Reason Eval/Treat Not Completed: PT screened, no needs identified, will sign off. Pt independent with mobility and no strength, ROM, or balance deficits. No PT/OT needs at this time.   [place], [place] 04/29/2014, 10:16 AM

## 2014-04-29 NOTE — Progress Notes (Signed)
Inpatient Diabetes Program Recommendations  AACE/ADA: New Consensus Statement on Inpatient Glycemic Control (2013)  Target Ranges:  Prepandial:   less than 140 mg/dL      Peak postprandial:   less than 180 mg/dL (1-2 hours)      Critically ill patients:  140 - 180 mg/dL   Reason for Visit: Hyperglycemia  Diabetes history: DM2 Outpatient Diabetes medications: Levemir 48 units QHS,  Current orders for Inpatient glycemic control: Levemir 40 units QHS, Novolog sensitive tidwc  Results for RENELL, COAXUM (MRN [id number]) as of 04/29/2014 08:47  Ref. Range 12/19/2013 18:58  Hemoglobin A1C Latest Range: <5.7 % 11.1 (H)   Results for BRITTNYE, JOSEPHS (MRN [id number]) as of 04/29/2014 08:47  Ref. Range 04/29/2014 00:44 04/29/2014 06:13  Glucose-Capillary Latest Range: 70-99 mg/dL 264 (H) 179 (H)   Inpatient Diabetes Program Recommendations Correction (SSI): Increase Novolog to Q4H while NPO then tidwc and hs Outpatient Referral: Would benefit from OP Diabetes Education consult for uncontrolled DM  Need updated HgbA1C to assess glycemic control prior to admission  Will order Living Well With Diabetes book F/U with pt this afternoon regarding home glucose control. Thank you. Lorenda Peck, RD, LDN, CDE Inpatient Diabetes Coordinator [phone]
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
## 2014-04-29 NOTE — Progress Notes (Signed)
Pt had a normal nuclear stress test today but her troponin came back positive after the stress test was completed. Given risk factors for CAD including tobacco abuse, DM, HTN, HLD and family history of CAD, I think we have to consider cardiac cath to exclude obstructive CAD. She is in agreement. Risks and benefits reviewed.   Plan cath tomorrow am with diagnosis of NSTEMI/Unstable angina.   Cath orders placed. NPO at midnight.   MCALHANY,CHRISTOPHER 04/29/2014 4:51 PM

## 2014-04-29 NOTE — Progress Notes (Addendum)
[facility] PROGRESS NOTE  Valerie Wood XFG:[id number] DOB: 1945/07/31 DOA: 04/28/2014 PCP: Mayra Neer, MD  Assessment/Plan: 1. NSTEMI/Unstable angina - chest pain with typical and atypical features -resolved, ruled out for MI with negative cardiac enzymes -appreciate  cardiology consultation -due to risk factors underwent Myoview which was negative for inducible ischemia -however last troponin was positive at 0.12, d/w Dr.McAlhaney, will observe overnight, check another set of troponins and cards will consider LHC tomorrow  2. DM -continue levemir, SSI  3. HLD:  -Continue statin.   4. Tobacco abuse -quit smoking  5. HLD:  -Continue statin.  Code Status: Full Code Family Communication: none at bedside Disposition Plan: home pending cath   Consultants:  Cards  Procedures:  myoview  HPI/Subjective: No further chest pain  Objective: Filed Vitals:   04/29/14 1420  BP: 127/70  Pulse: 73  Temp: 97.6 F (36.4 C)  Resp: 20    Intake/Output Summary (Last 24 hours) at 04/29/14 1537 Last data filed at 04/29/14 1300  Gross per 24 hour  Intake    360 ml  Output    450 ml  Net    -90 ml   Filed Weights   04/28/14 1925 04/29/14 0012  Weight: 72.122 kg (159 lb) 72.6 kg (160 lb 0.9 oz)    Exam:   General:  AAOx3, no distress  Cardiovascular: S1S2/RRR  Respiratory: CTAB  Abdomen: soft, NT, BS present  Musculoskeletal: no edema c/c   Data Reviewed: Basic Metabolic Panel:  Recent Labs Lab 04/28/14 1933 04/29/14 0933  NA 133* 138  K 4.2 4.6  CL 99 103  CO2 23 28  GLUCOSE 390* 159*  BUN 10 9  CREATININE 0.75 0.75  CALCIUM 10.1 10.2   Liver Function Tests:  Recent Labs Lab 04/28/14 1933  AST 33  ALT 35  ALKPHOS 108  BILITOT 0.6  PROT 6.8  ALBUMIN 3.9    Recent Labs Lab 04/28/14 1933  LIPASE 43   No results for input(s): AMMONIA in the last 168 hours. CBC:  Recent Labs Lab 04/28/14 1933 04/29/14 0933  WBC 6.9 5.4   HGB 14.0 13.9  HCT 41.0 40.9  MCV 86.9 87.8  PLT 224 222   Cardiac Enzymes:  Recent Labs Lab 04/29/14 0220 04/29/14 0933  TROPONINI <0.03 0.12*   BNP (last 3 results) No results for input(s): BNP in the last 8760 hours.  ProBNP (last 3 results) No results for input(s): PROBNP in the last 8760 hours.  CBG:  Recent Labs Lab 04/29/14 0044 04/29/14 0613 04/29/14 1326  GLUCAP 264* 179* 223*    No results found for this or any previous visit (from the past 240 hour(s)).   Studies: Dg Chest 2 View  04/28/2014   CLINICAL DATA:  Chest pain for 1 day with shortness of breath. Pain across entire chest radiating into left arm.  EXAM: CHEST  2 VIEW  COMPARISON:  12/20/2013  FINDINGS: The lungs remain mildly hyperinflated with prominent interstitial markings, similar to prior exam. The cardiomediastinal contours are normal. Pulmonary vasculature is normal. No consolidation, pleural effusion, or pneumothorax. There are old posterior right rib fractures. No acute osseous abnormalities are seen.  IMPRESSION: No acute pulmonary process.   Electronically Signed   By: Jeb Levering M.D.   On: 04/28/2014 21:44   Nm Myocar Multi W/spect W/wall Motion / Ef  04/29/2014   CLINICAL DATA:  Chest pain, diabetes, hypertension and coronary artery disease.  EXAM: MYOCARDIAL IMAGING WITH SPECT (REST AND EXERCISE)  GATED  LEFT VENTRICULAR WALL MOTION STUDY  LEFT VENTRICULAR EJECTION FRACTION  TECHNIQUE: Standard myocardial SPECT imaging was performed after resting intravenous injection of 10 mCi Tc-83m sestamibi. Subsequently, exercise tolerance test was performed by the patient under the supervision of the Cardiology staff. At peak-stress, 30 mCi Tc-93m sestamibi was injected intravenously and standard myocardial SPECT imaging was performed. Quantitative gated imaging was also performed to evaluate left ventricular wall motion, and estimate left ventricular ejection fraction.  COMPARISON:  None.  FINDINGS:  Perfusion: No decreased activity in the left ventricle on stress imaging to suggest reversible ischemia or infarction.  Wall Motion: Normal left ventricular wall motion. No left ventricular dilation.  Left Ventricular Ejection Fraction: 57 %  End diastolic volume 62 ml  End systolic volume 26 ml  IMPRESSION: 1. No reversible ischemia or infarction.  2. Normal left ventricular wall motion.  3. Left ventricular ejection fraction 57%  4. Low/Intermediate/High-risk stress test findings*.  *2012 Appropriate Use Criteria for Coronary Revascularization Focused Update: J Am Coll Cardiol. 9150;56(9):794-801. http://content.airportbarriers.com.aspx?articleid=1201161   Electronically Signed   By: Marijo Sanes M.D.   On: 04/29/2014 13:36    Scheduled Meds: . aspirin EC  81 mg Oral Daily  . atorvastatin  40 mg Oral QHS  . carvedilol  3.125 mg Oral BID WC  . cholecalciferol  1,000 Units Oral Daily  . docusate sodium  100 mg Oral BID  . fluticasone  1 spray Each Nare Daily  . heparin  5,000 Units Subcutaneous 3 times per day  . insulin aspart  0-9 Units Subcutaneous TID WC  . insulin detemir  40 Units Subcutaneous QHS  . ketotifen  1 drop Both Eyes BID  . loratadine  10 mg Oral Daily  . losartan  50 mg Oral QHS  . omega-3 acid ethyl esters  1 g Oral Daily  . pantoprazole  40 mg Oral Daily  . sodium chloride  3 mL Intravenous Q12H   Continuous Infusions: . sodium chloride 75 mL/hr at 04/29/14 0015   Antibiotics Given (last 72 hours)    None      Principal Problem:   Chest pain Active Problems:   GERD (gastroesophageal reflux disease)   Gastroparesis   Anxiety   Migraine   Diabetes mellitus without complication   Essential hypertension   HLD (hyperlipidemia)    Time spent: 75min    ,  [facility] Pager [phone]. If 7PM-7AM, please contact night-coverage at [email or web address], password [facility] 04/29/2014, 3:37 PM  LOS: 1 day

## 2014-04-30 ENCOUNTER — Encounter (HOSPITAL_COMMUNITY)
Admission: EM | Disposition: A | Discharge status: Home / Self Care | Payer: Self-pay | Source: Home / Self Care | Attending: Internal Medicine

## 2014-04-30 DIAGNOSIS — I25110 Atherosclerotic heart disease of native coronary artery with unstable angina pectoris: Secondary | ICD-10-CM

## 2014-04-30 DIAGNOSIS — R079 Chest pain, unspecified: Secondary | ICD-10-CM | POA: Insufficient documentation

## 2014-04-30 DIAGNOSIS — R739 Hyperglycemia, unspecified: Secondary | ICD-10-CM | POA: Insufficient documentation

## 2014-04-30 DIAGNOSIS — I214 Non-ST elevation (NSTEMI) myocardial infarction: Secondary | ICD-10-CM | POA: Insufficient documentation

## 2014-04-30 LAB — GLUCOSE, CAPILLARY
Glucose-Capillary: 205 mg/dL — ABNORMAL HIGH (ref 70–99)
Glucose-Capillary: 146 mg/dL — ABNORMAL HIGH (ref 70–99)
Glucose-Capillary: 115 mg/dL — ABNORMAL HIGH (ref 70–99)

## 2014-04-30 LAB — TROPONIN I: Troponin I: 0.1 ng/mL — ABNORMAL HIGH (ref <0.031)

## 2014-04-30 SURGERY — LEFT HEART CATHETERIZATION WITH CORONARY ANGIOGRAM
Anesthesia: LOCAL

## 2014-04-30 MED ORDER — SODIUM CHLORIDE 0.9 % IV SOLN: 250.0000 mL | INTRAVENOUS | Status: DC | PRN

## 2014-04-30 MED ORDER — HEPARIN (PORCINE) IN NACL 2-0.9 UNIT/ML-% IJ SOLN
INTRAMUSCULAR | Status: AC
  Filled 2014-04-30: qty 1500

## 2014-04-30 MED ORDER — FENTANYL CITRATE 0.05 MG/ML IJ SOLN
INTRAMUSCULAR | Status: AC
  Filled 2014-04-30: qty 2

## 2014-04-30 MED ORDER — HEPARIN SODIUM (PORCINE) 1000 UNIT/ML IJ SOLN
INTRAMUSCULAR | Status: AC
  Filled 2014-04-30: qty 1

## 2014-04-30 MED ORDER — VERAPAMIL HCL 2.5 MG/ML IV SOLN
INTRAVENOUS | Status: AC
  Filled 2014-04-30: qty 2

## 2014-04-30 MED ORDER — NITROGLYCERIN 1 MG/10 ML FOR IR/CATH LAB
INTRA_ARTERIAL | Status: AC
  Filled 2014-04-30: qty 10

## 2014-04-30 MED ORDER — SODIUM CHLORIDE 0.9 % IJ SOLN
3.0000 mL | Freq: Two times a day (BID) | INTRAMUSCULAR | Status: DC
  Administered 2014-04-30: 3 mL via INTRAVENOUS

## 2014-04-30 MED ORDER — SODIUM CHLORIDE 0.9 % IJ SOLN: 3.0000 mL | INTRAMUSCULAR | Status: DC | PRN

## 2014-04-30 MED ORDER — LIDOCAINE HCL (PF) 1 % IJ SOLN
INTRAMUSCULAR | Status: AC
  Filled 2014-04-30: qty 30

## 2014-04-30 MED ORDER — SODIUM CHLORIDE 0.9 % IV SOLN
INTRAVENOUS | Status: DC
  Administered 2014-04-30: 16:00:00 via INTRAVENOUS

## 2014-04-30 MED ORDER — MIDAZOLAM HCL 2 MG/2ML IJ SOLN
INTRAMUSCULAR | Status: AC
  Filled 2014-04-30: qty 2

## 2014-04-30 NOTE — CV Procedure (Signed)
CARDIAC CATHETERIZATION REPORT  NAME:  Valerie Wood   MRN: [id number] DOB:  10-26-1945   ADMIT DATE: 04/28/2014 Procedure Date: 04/30/2014  INTERVENTIONAL CARDIOLOGIST: Leonie Man, M.D., MS PRIMARY CARE PROVIDER: Mayra Neer, MD PRIMARY CARDIOLOGIST: New to [facility]  PATIENT:  Valerie Wood is a 69 y.o. female with history of GERD and tobacco abuse who presented with chest pain. She was evaluated with a Myoview stress test that was actually normal, but her troponin levels became elevated after the test is therefore referred for cardiac catheterization or possible non-STEMI.  PRE-OPERATIVE DIAGNOSIS:    Chest pain with high risk of cardiac etiology - for unstable angina/non-STEMI  Positive troponins  PROCEDURES PERFORMED:    Left Heart Catheterization with Native Coronary Angiography  via Right Radial Artery   Left Ventriculography  PROCEDURE: The patient was brought to the [facility] in the fasting state and prepped and draped in the usual sterile fashion for Right Radial artery access. A modified Allen's test was performed on the right wrist demonstrating excellent collateral flow for radial access.   Sterile technique was used including antiseptics, cap, gloves, gown, hand hygiene, mask and sheet. Skin prep: Chlorhexidine.   Consent: Risks of procedure as well as the alternatives and risks of each were explained to the (patient/caregiver). Consent for procedure obtained.   Time Out: Verified patient identification, verified procedure, site/side was marked, verified correct patient position, special equipment/implants available, medications/allergies/relevent history reviewed, required imaging and test results available. Performed.  Access:   Right Radial Artery: 6 Fr Sheath -  Seldinger Technique (Angiocath Micropuncture Kit)  Radial Cocktail - 10 mL; IV Heparin 4000 Units   Left Heart Catheterization: 5 Fr Catheters advanced  or exchanged over a long exchange safety J-wire - advanced under fluoroscopic guidance ascending aorta and used to selectively engage the left and right coronary arteries and cross the aortic valve for measurement of hemodynamics. TIG 4.0 catheter advanced first.  Left & Right Coronary Artery Cineangiography: TIG 4.0 Catheter   LV Hemodynamics (LV Gram): Angled pigtail  Sheath removed in the cardiac catheterization lab with TR band placement for hemostasis.  TR Band: 1510  Hours; 12 mL air  FINDINGS:  Hemodynamics:   Central Aortic Pressure / Mean: 119/65/83 mmHg  Left Ventricular Pressure / LVEDP: 124/8/18 mmHg  Left Ventriculography:  EF: 60-65%   Wall Motion: Normal  Coronary Anatomy:  Dominance: Right  Left Main: Normal caliber vessel that trifurcates into the LAD, Ramus Intermedius and Circumflex. Angiographically normal. LAD: Normal caliber vessel with 2 diagonal branches. There is a roughly 30% eccentric stenosis at the takeoff of the second diagonal branch. The vessel that is relatively normal caliber and smoothly tapers down to the apex. No significant lesions.  D1 & D2: Small moderate caliber vessels. D1 is ostial 30% stenosis. Otherwise both vessels are free of disease.  Left Circumflex: Normal caliber, nondominant vessel that essentially terminates as a lateral OM with 2 small branches. Mildly tortuous. Angiographically normal..  Ramus intermedius: Small caliber vessel that courses of high OM. Angiographically normal. .  RCA: Large-caliber, dominant vessel with several marginal branches. Bifurcates distally into the  Right Posterior AV Groove Branch (RPAV) and the Right Posterior Descending Artery (RPDA)  RPDA: Moderate caliber vessel that tapers down to the apex. Angiographically normal.  RPL Sysytem:The RPAV begins as a moderate caliber vessel that gives off one major RPL with 2 small branches.  MEDICATIONS:  Anesthesia:  Local Lidocaine 2 ml  Sedation:  1  mg IV Versed, 25 mcg IV fentanyl ;   Omnipaque Contrast: 80 ml  Anticoagulation:  IV Heparin 4000 Units Radial Cocktail: 5 mg Verapamil, 400 mcg NTG, 2 ml 2% Lidocaine in 10 ml NS  PATIENT DISPOSITION:    The patient was transferred to the PACU holding area in a hemodynamicaly stable, chest pain free condition.  The patient tolerated the procedure well, and there were no complications.  EBL:   < 10 ml  The patient was stable before, during, and after the procedure.  POST-OPERATIVE DIAGNOSIS:    Angiographically only mild coronary disease in the LAD and first diagonal branch. Otherwise relativelysignificant coronary artery disease.  No culprit lesion to explain the patient's chest pain episode with recurrent symptoms.  PLAN OF CARE:  Transfer back to nursing unit FOR standard post radial cath care.  Consider non-cardiac etiology for the patient's symptoms.  From a cardiac standpoint with stable for discharge today. Defer to primary service   HARDING, Leonie Green, M.D., M.S. Interventional Cardiologist   Pager # [phone]

## 2014-04-30 NOTE — Discharge Summary (Signed)
Physician Discharge Summary  Valerie Wood OZH:[id number] DOB: [birth year] DOA: 04/28/2014  PCP: Mayra Neer, MD  Admit date: 04/28/2014 Discharge date: 04/30/2014  Time spent: 45 minutes  Recommendations for Outpatient Follow-up:  1. PCP in 1 week  Discharge Diagnoses:  Principal Problem:   Chest pain Active Problems:   GERD (gastroesophageal reflux disease)   Gastroparesis   Anxiety   Migraine   Diabetes mellitus without complication   Essential hypertension   HLD (hyperlipidemia)   Chest pain at rest   Hyperglycemia   NSTEMI (non-ST elevated myocardial infarction)   Discharge Condition: stable  Diet recommendation: low sodium  Filed Weights   04/28/14 1925 04/29/14 0012  Weight: 72.122 kg (159 lb) 72.6 kg (160 lb 0.9 oz)    History of present illness:  Valerie Wood is a 69 y.o. female with past medical history hypertension, GERD, hyperlipidemia, diabetes mellitus, anxiety, remote uterine cancer, migraine headaches, who presented to the ER with chest pain. Her chest pain started at about 6 PM, it was located in the substernal area, constant, radiating to left arm and neck, associated with shortness of breath and mild nausea  Hospital Course:  1. NSTEMI/Unstable angina -presented with chest pain having typical and atypical features -resolved, initially had negative cardiac enzymes -was seen by cardiology in consultation -due to risk factors underwent Myoview which was negative for inducible ischemia -however last troponin was positive at 0.12, d/w Dr.McAlhaney, and she underwent a LHC which showed mild non obstructive CAD today, she will be discharged home after routine post cath care if stable. -advised to continue ASA, statin  2. DM -continue levemir, SSI  3. HLD:  -Continue statin.   4. Tobacco abuse -quit smoking  5. HLD:  -Continue statin.  Procedures: 2. LHC: Angiographically only mild coronary disease in the LAD and first diagonal branch.  Otherwise relativelysignificant coronary artery disease.  No culprit lesion to explain the patient's chest pain episode with recurrent symptoms  Consultations:  Cardiology  Discharge Exam: Filed Vitals:   04/30/14 1620  BP: 110/65  Pulse:   Temp:   Resp:     General: AAOx3 Cardiovascular: S1S2/RRR Respiratory:CTAB  Discharge Instructions   Discharge Instructions    Diet - low sodium heart healthy    Complete by:  As directed      Increase activity slowly    Complete by:  As directed           Current Discharge Medication List    CONTINUE these medications which have NOT CHANGED   Details  aspirin EC 81 MG tablet Take 81 mg by mouth daily.    atorvastatin (LIPITOR) 40 MG tablet Take 40 mg by mouth at bedtime.     azelastine (OPTIVAR) 0.05 % ophthalmic solution Place 1 drop into both eyes daily. Refills: 0    budesonide (RHINOCORT AQUA) 32 MCG/ACT nasal spray Place 1 spray into both nostrils daily as needed for rhinitis.    budesonide-formoterol (SYMBICORT) 160-4.5 MCG/ACT inhaler Inhale 2 puffs into the lungs 2 (two) times daily as needed (for shortness of breath).     cetirizine (ZYRTEC) 10 MG tablet Take 10 mg by mouth at bedtime.     cholecalciferol (VITAMIN D) 1000 UNITS tablet Take 1,000 Units by mouth daily.    guaiFENesin 200 MG tablet Take 1 tablet (200 mg total) by mouth every 6 (six) hours as needed for cough or to loosen phlegm. Qty: 30 suppository, Refills: 0    Insulin Detemir (LEVEMIR FLEXTOUCH) 100 UNIT/ML Pen  Inject 48 Units into the skin at bedtime.     losartan (COZAAR) 50 MG tablet Take 50 mg by mouth at bedtime.     Omega-3 Fatty Acids (FISH OIL PO) Take 1 capsule by mouth daily.    omeprazole (PRILOSEC) 20 MG capsule Take 20 mg by mouth at bedtime.     bisacodyl (DULCOLAX) 10 MG suppository Place 1 suppository (10 mg total) rectally daily as needed for moderate constipation or severe constipation. Qty: 12 suppository, Refills: 0     docusate sodium 100 MG CAPS Take 100 mg by mouth 2 (two) times daily. Qty: 10 capsule, Refills: 0    SUMAtriptan (IMITREX) 100 MG tablet Take 100 mg by mouth every 2 (two) hours as needed for migraine.    traMADol (ULTRAM) 50 MG tablet Take 1-2 tablets (50-100 mg total) by mouth every 6 (six) hours as needed (50mg  for mild pain, 75mg  for moderate pain, 100mg  for severe pain). Qty: 40 tablet, Refills: 0       Allergies  Allergen Reactions  . Invokana [Canagliflozin] Itching and Nausea And Vomiting  . Macrolides And Ketolides Nausea And Vomiting  . Morphine And Related Nausea And Vomiting  . Tetracyclines & Related Itching and Nausea And Vomiting  . Tramadol Nausea And Vomiting   Follow-up Information    Follow up with SHAW,KIMBERLEE, MD. Schedule an appointment as soon as possible for a visit in 1 week.   Specialty:  Family Medicine   Contact information:   [address] [place] [address] [phone]        The results of significant diagnostics from this hospitalization (including imaging, microbiology, ancillary and laboratory) are listed below for reference.    Significant Diagnostic Studies: Dg Chest 2 View  04/28/2014   CLINICAL DATA:  Chest pain for 1 day with shortness of breath. Pain across entire chest radiating into left arm.  EXAM: CHEST  2 VIEW  COMPARISON:  12/20/2013  FINDINGS: The lungs remain mildly hyperinflated with prominent interstitial markings, similar to prior exam. The cardiomediastinal contours are normal. Pulmonary vasculature is normal. No consolidation, pleural effusion, or pneumothorax. There are old posterior right rib fractures. No acute osseous abnormalities are seen.  IMPRESSION: No acute pulmonary process.   Electronically Signed   By: Jeb Levering M.D.   On: 04/28/2014 21:44   Nm Myocar Multi W/spect W/wall Motion / Ef  04/29/2014   CLINICAL DATA:  Chest pain, diabetes, hypertension and coronary artery disease.  EXAM:  MYOCARDIAL IMAGING WITH SPECT (REST AND EXERCISE)  GATED LEFT VENTRICULAR WALL MOTION STUDY  LEFT VENTRICULAR EJECTION FRACTION  TECHNIQUE: Standard myocardial SPECT imaging was performed after resting intravenous injection of 10 mCi Tc-23m sestamibi. Subsequently, exercise tolerance test was performed by the patient under the supervision of the Cardiology staff. At peak-stress, 30 mCi Tc-77m sestamibi was injected intravenously and standard myocardial SPECT imaging was performed. Quantitative gated imaging was also performed to evaluate left ventricular wall motion, and estimate left ventricular ejection fraction.  COMPARISON:  None.  FINDINGS: Perfusion: No decreased activity in the left ventricle on stress imaging to suggest reversible ischemia or infarction.  Wall Motion: Normal left ventricular wall motion. No left ventricular dilation.  Left Ventricular Ejection Fraction: 57 %  End diastolic volume 62 ml  End systolic volume 26 ml  IMPRESSION: 1. No reversible ischemia or infarction.  2. Normal left ventricular wall motion.  3. Left ventricular ejection fraction 57%  4. Low/Intermediate/High-risk stress test findings*.  *2012 Appropriate  Use Criteria for Coronary Revascularization Focused Update: J Am Coll Cardiol. 0301;31(4):388-875. http://content.airportbarriers.com.aspx?articleid=[email or web address]   Electronically Signed   By: Marijo Sanes M.D.   On: 04/29/2014 13:36    Microbiology: No results found for this or any previous visit (from the past 240 hour(s)).   Labs: Basic Metabolic Panel:  Recent Labs Lab 04/28/14 1933 04/29/14 0933  NA 133* 138  K 4.2 4.6  CL 99 103  CO2 23 28  GLUCOSE 390* 159*  BUN 10 9  CREATININE 0.75 0.75  CALCIUM 10.1 10.2   Liver Function Tests:  Recent Labs Lab 04/28/14 1933  AST 33  ALT 35  ALKPHOS 108  BILITOT 0.6  PROT 6.8  ALBUMIN 3.9    Recent Labs Lab 04/28/14 1933  LIPASE 43   No results for input(s): AMMONIA in the last 168  hours. CBC:  Recent Labs Lab 04/28/14 1933 04/29/14 0933  WBC 6.9 5.4  HGB 14.0 13.9  HCT 41.0 40.9  MCV 86.9 87.8  PLT 224 222   Cardiac Enzymes:  Recent Labs Lab 04/29/14 0220 04/29/14 0933 04/29/14 1701 04/30/14 0001  TROPONINI <0.03 0.12* 0.14* 0.10*   BNP: BNP (last 3 results) No results for input(s): BNP in the last 8760 hours.  ProBNP (last 3 results) No results for input(s): PROBNP in the last 8760 hours.  CBG:  Recent Labs Lab 04/29/14 1653 04/29/14 2036 04/30/14 0620 04/30/14 1115 04/30/14 1615  GLUCAP 163* 245* 205* 146* 115*       Signed:  ,  [facility] 04/30/2014, 4:57 PM

## 2014-04-30 NOTE — Progress Notes (Signed)
Deflated 12cc of air from TR band. TR band removed and no complications noted. VS within normal limits.

## 2014-04-30 NOTE — Progress Notes (Signed)
Utilization review completed.  , RN, BSN. 
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
## 2014-04-30 NOTE — Progress Notes (Signed)
Discharge instructions given. Pt verbalized understanding and all questions were answered.  

## 2014-04-30 NOTE — Interval H&P Note (Signed)
History and Physical Interval Note:  04/30/2014 2:33 PM  Valerie Wood  has presented today for surgery, with the diagnosis of NSTEMI.  F/u note last night by Dr. Angelena Form: Pt had a normal nuclear stress test today but her troponin came back positive after the stress test was completed. Given risk factors for CAD including tobacco abuse, DM, HTN, HLD and family history of CAD, I think we have to consider cardiac cath to exclude obstructive CAD. She is in agreement. Risks and benefits reviewed.   Plan cath tomorrow am with diagnosis of NSTEMI/Unstable angina.   Cath orders placed. NPO at midnight.   The various methods of treatment have been discussed with the patient and family. After consideration of risks, benefits and other options for treatment, the patient has consented to  Procedure(s): LEFT HEART CATHETERIZATION WITH CORONARY ANGIOGRAM (N/A) +/- PCI as a surgical intervention .  The patient's history has been reviewed, patient examined, no change in status, stable for surgery.  I have reviewed the patient's chart and labs.  Questions were answered to the patient's satisfaction.    Cath Lab Visit (complete for each Cath Lab visit)  Clinical Evaluation Leading to the Procedure:   ACS: Yes.    Non-ACS:    Anginal Classification: CCS III  Anti-ischemic medical therapy: Minimal Therapy (1 class of medications)  Non-Invasive Test Results: Low-risk stress test findings: cardiac mortality <1%/year  Prior CABG: No previous CABG   TIMI Score  Patient Information:  TIMI Score is 5  Revascularization of the presumed culprit artery  A (9)  Indication: 11; Score: 9 TIMI Score  Patient Information:  TIMI Score is 5  Revascularization of multiple coronary arteries when the culprit artery cannot clearly be determined  A (9)  Indication: 12; Score: 9  HARDING, DAVID W

## 2014-05-04 DIAGNOSIS — Z794 Long term (current) use of insulin: Secondary | ICD-10-CM | POA: Diagnosis not present

## 2014-05-04 DIAGNOSIS — E119 Type 2 diabetes mellitus without complications: Secondary | ICD-10-CM | POA: Diagnosis not present

## 2014-05-15 ENCOUNTER — Ambulatory Visit
Payer: Commercial Managed Care - HMO | Attending: Internal Medicine | Admitting: Rehabilitative and Restorative Service Providers"

## 2014-05-15 DIAGNOSIS — Z9181 History of falling: Secondary | ICD-10-CM | POA: Diagnosis not present

## 2014-05-15 DIAGNOSIS — H8111 Benign paroxysmal vertigo, right ear: Secondary | ICD-10-CM | POA: Diagnosis not present

## 2014-05-15 NOTE — Patient Instructions (Signed)
Begin these exercises on Sunday 4/10 if you have any remaining dizziness or nausea. Do these exercises until you have 2 days of no dizziness when doing these movements.  They may take 5-10 days to resolve symptoms.  Rolling   With pillow under head, start on back. Roll slowly to right. Hold position until symptoms stop, plus 20 seconds. Roll slowly onto left side. Hold position until symptoms stop, plus 20 seconds. Repeat sequence _5_ times per session. Do __2__ sessions per day.  Copyright  VHI. All rights reserved.  Sit to Side-Lying   Sit on edge of bed. 1. Turn head 45 to right. 2. Maintain head position and lie down slowly on left side. Hold until symptoms stop, plus 20 seconds. 3. Sit up slowly. Hold until symptoms stop, plus 20 seconds. 4. Turn head 45 to left. 5. Maintain head position and lie down slowly on right side. Hold until symptoms stop, plus 20 seconds. 6. Sit up slowly. Repeat sequence _5___ times per session. Do ___2_ sessions per day.  Copyright  VHI. All rights reserved.   Special Instructions: Exercises may bring on mild to moderate symptoms of dizziness and/or nausea that resolve within 30 minutes of completing exercises. If symptoms are lasting longer than 30 minutes, modify your exercises by:  >decreasing the # of times you complete each activity >ensuring your symptoms return to baseline before moving onto the next exercise >dividing up exercises so you do not do them all in one session, but multiple short sessions throughout the day >doing them once a day until symptoms improve

## 2014-05-15 NOTE — Therapy (Signed)
[facility] [address] [place], [place], [address] Phone: [phone]   Fax:  [phone]  Physical Therapy Treatment  Patient Details  Name: Valerie Wood MRN: [id number] Date of Birth: 15-Apr-1945 Referring Provider:  Mayra Neer, MD  Encounter Date: 05/15/2014      PT End of Session - 05/15/14 1609    Visit Number 4  G4   Number of Visits 8   Date for PT Re-Evaluation 06/14/14   Authorization Type [employer]   PT Start Time 0240   PT Stop Time 1455   PT Time Calculation (min) 50 min   Activity Tolerance Other (comment)  pt needed rest breaks due to nausea   Behavior During Therapy [facility] for tasks assessed/performed      Past Medical History  Diagnosis Date  . Hypertension   . Shortness of breath     quit smoking 2013  . Anxiety   . GERD (gastroesophageal reflux disease)   . Diabetes mellitus     pt didn't divulge DM during PAT interview but Dr. Paulene Floor nurse states that pt is type 2 diabetic, controlled by diet.  . Gastroparesis due to DM   . Hyperlipidemia   . Fibromyalgia   . Thyroid nodule   . Seasonal allergies   . Migraine   . Tobacco dependence   . Osteopenia   . Stricture and stenosis of esophagus   . Duodenitis without mention of hemorrhage   . 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 Esophagitis, unspecified   . Hx of colonic polyps   . Uterine cancer 1989    Past Surgical History  Procedure Laterality Date  . Cholecystectomy    . Abdominal hysterectomy  12/1987    BSO  . Appendectomy    . Tonsillectomy    . Rectocele repair  05/11/2011    Procedure: POSTERIOR REPAIR (RECTOCELE);  Surgeon: Cheri Fowler, MD;  Location: [facility];  Service: Gynecology;  Laterality: N/A;  . Colonoscopy      multiple  . Esophagogastroduodenoscopy      multiple  . Tubal ligation    . Bladder surgery      Tack     There were no vitals filed for this visit.  Visit Diagnosis:   BPPV (benign paroxysmal positional vertigo), right      Subjective Assessment - 05/15/14 1414    Subjective The patient reports that she noted dizziness yesterday at her dentist office when her head was turned to the right side.  She noted nausea and dizziness last night when rolling in bed.   Currently in Pain? No/denies          Vestibular Assessment - 05/15/14 1417    Positional Testing   Dix-Hallpike Dix-Hallpike Right;Dix-Hallpike Left   Sidelying Test Sidelying Right;Sidelying Left   Horizontal Canal Testing Horizontal Canal Right;Horizontal Canal Left   Dix-Hallpike Right   Dix-Hallpike Right Duration --  20 second latency followed by 30+ seconds of mild nystagmus   Dix-Hallpike Right Symptoms Upbeat, right rotatory nystagmus   Dix-Hallpike Left   Dix-Hallpike Left Symptoms No nystagmus mild dizziness with L dix hallpike   Sidelying Right   Sidelying Right Duration --  seconds   Sidelying Right Symptoms Upbeat, right rotatory nystagmus  20+ seconds   Sidelying Left   Sidelying Left Duration  brief duration/<5 seconds   Sidelying Left Symptoms Upbeat, left rotatory nystagmus  trace nystagmus   Horizontal Canal Right   Horizontal Canal Right Duration --  seconds   Horizontal Canal Right Symptoms Normal  however experiences dizziness + nausea   Horizontal Canal Left   Horizontal Canal Left Duration --  seconds   Horizontal Canal Left Symptoms Normal  subjective report of dizziness + nausea           Vestibular Treatment/Exercise - 05/15/14 1431    Vestibular Treatment/Exercise   Vestibular Treatment Provided Canalith Repositioning;Habituation   Canalith Repositioning Epley Manuever Right   Habituation Exercises Brandt Daroff;Horizontal Roll    EPLEY MANUEVER RIGHT   Number of Reps  1   Overall Response Improved Symptoms   Nestor Lewandowsky   Number of Reps  --  3   Symptom Description  --  dizziness worse to R, improved with repetition   Horizontal Roll    Number of Reps  1   Symptom Description  --  significant nausea with 1 repetition of rolling      CANOLITH REPOSITIONING TECHNIQUE: Epley's R (see above) NEUROMUSCULAR RE-EDUCATION: (see above)        PT Education - 05/15/14 1439    Education provided Yes   Education Details HEP: brandt daroff habituation, horizontal rolling habituation.   Person(s) Educated Patient   Methods Explanation;Demonstration;Handout   Comprehension Returned demonstration;Verbalized understanding          PT Short Term Goals - 04/06/14 1136    PT SHORT TERM GOAL #1   Title same as LTG's           PT Long Term Goals - 05/15/14 1610    PT LONG TERM GOAL #1   Title Pt. will have a negative R Dix-Hallpike to indicate that R BPPV has resolved.   Time 4   Period Weeks   Status New   PT LONG TERM GOAL #2   Title Pt. will report no vertigo with bed mobility or ambulation.   Time 4   Period Weeks   Status New   PT LONG TERM GOAL #3   Title Independent in HEP for habituation of BPPV - Brandt-Daroff exercises.   Time 4   Period Weeks   Status New               Plan - 05/15/14 1613    Clinical Impression Statement The patient was placed on hold last month due to resolution of BPPV.  She called the clinic today reporting return of symptoms yesterday and presents with R BPPV noted through positive R dix hallpike testing.  She has mild L BPPV also noted through positive L sidelying test.  The patient has motion intolerance experiencing nausea with horizontal rolling, although nystagmus not viewed in room light.  PT treated today with R Epley's maneuver and recommends she begin habituation HEP on Sunday if any symptoms remain.    Pt will benefit from skilled therapeutic intervention in order to improve on the following deficits Decreased balance;Decreased mobility  vertigo   Rehab Potential Good   PT Frequency 1x / week   PT Duration 4 weeks   PT Treatment/Interventions ADLs/Self Care Home  Management;Therapeutic activities;Patient/family education;Therapeutic exercise;Neuromuscular re-education;Balance training;Other (comment)  canolith repositioning, vestibular rehab   PT Next Visit Plan Recheck BPPV, check habituation HEP, treat as indicated.   Consulted and Agree with Plan of Care Patient        Problem List Patient Active Problem List   Diagnosis Date Noted  . Chest pain at rest   . Hyperglycemia   . NSTEMI (non-ST elevated myocardial infarction)   . Diabetes mellitus  without complication 14/99/6924  . Essential hypertension 04/28/2014  . HLD (hyperlipidemia) 04/28/2014  . Chest pain 04/28/2014  . Pain in the chest   . Fall 12/22/2013  . Concussion 12/22/2013  . Lumbar transverse process fracture 12/22/2013  . Acute blood loss anemia 12/22/2013  . Migraine 12/22/2013  . DM (diabetes mellitus) 12/22/2013  . Fracture of multiple ribs of right side 12/19/2013  . GERD (gastroesophageal reflux disease) 08/03/2011  . Gastroparesis 08/03/2011  . Anxiety 08/03/2011  . Rectocele 05/12/2011  . 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 COLONIC POLYPS, ADENOMATOUS, HX OF 05/02/2007    ,, PT 05/15/2014, 4:16 PM  [facility] [address] [place], [place], [address] Phone: [phone]   Fax:  [phone]

## 2014-05-25 DIAGNOSIS — Z0100 Encounter for examination of eyes and vision without abnormal findings: Secondary | ICD-10-CM | POA: Diagnosis not present

## 2014-05-27 ENCOUNTER — Ambulatory Visit: Payer: Commercial Managed Care - HMO | Admitting: Rehabilitative and Restorative Service Providers"

## 2014-05-27 DIAGNOSIS — H8111 Benign paroxysmal vertigo, right ear: Secondary | ICD-10-CM

## 2014-05-27 DIAGNOSIS — Z9181 History of falling: Secondary | ICD-10-CM | POA: Diagnosis not present

## 2014-05-27 NOTE — Therapy (Signed)
[facility] [address] [place], [place], [address] Phone: [phone]   Fax:  [phone]  Physical Therapy Treatment  Patient Details  Name: Valerie Wood MRN: [id number] Date of Birth: 1945/12/20 Referring Provider:  Mayra Neer, MD  Encounter Date: 05/27/2014      PT End of Session - 05/27/14 1224    Visit Number 5  G5   Number of Visits 8   Date for PT Re-Evaluation 06/14/14   Authorization Type [employer]   PT Start Time 4008   PT Stop Time 1015   PT Time Calculation (min) 40 min   Activity Tolerance Other (comment)  pt needed rest breaks due to nausea   Behavior During Therapy [facility] for tasks assessed/performed      Past Medical History  Diagnosis Date  . Hypertension   . Shortness of breath     quit smoking 2013  . Anxiety   . GERD (gastroesophageal reflux disease)   . Diabetes mellitus     pt didn't divulge DM during PAT interview but Dr. Paulene Floor nurse states that pt is type 2 diabetic, controlled by diet.  . Gastroparesis due to DM   . Hyperlipidemia   . Fibromyalgia   . Thyroid nodule   . Seasonal allergies   . Migraine   . Tobacco dependence   . Osteopenia   . Stricture and stenosis of esophagus   . Duodenitis without mention of hemorrhage   . 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 Esophagitis, unspecified   . Hx of colonic polyps   . Uterine cancer 1989    Past Surgical History  Procedure Laterality Date  . Cholecystectomy    . Abdominal hysterectomy  12/1987    BSO  . Appendectomy    . Tonsillectomy    . Rectocele repair  05/11/2011    Procedure: POSTERIOR REPAIR (RECTOCELE);  Surgeon: Cheri Fowler, MD;  Location: [facility];  Service: Gynecology;  Laterality: N/A;  . Colonoscopy      multiple  . Esophagogastroduodenoscopy      multiple  . Tubal ligation    . Bladder surgery      Tack     There were no vitals filed for this visit.  Visit Diagnosis:   BPPV (benign paroxysmal positional vertigo), right      Subjective Assessment - 05/27/14 0938    Subjective The patient reports that she is not as dizzy with rolling in bed, but notes worse symptoms with waking up.  She notes nausea this morning.  She reports that her blood sugar is running high and she is not sure is nausea related to blood sugar or dizziness.  She reports that she has not eaten yet today.   Currently in Pain? No/denies       NEUROMUSCULAR RE-EDUCATION:      Vestibular Assessment - 05/27/14 0940    Positional Testing   Dix-Hallpike Dix-Hallpike Right;Dix-Hallpike Left   Sidelying Test Sidelying Right;Sidelying Left   Horizontal Canal Testing Horizontal Canal Right;Horizontal Canal Left   Sidelying Right   Sidelying Right Symptoms --  latency period of 15 sec, noted 2-3 beats and stopped quick   Sidelying Left   Sidelying Left Symptoms --  Patient felt 1-2 sec duration of dizziness   Horizontal Canal Right   Horizontal Canal Right Duration --  2-3 seconds   Horizontal Canal Right Symptoms --  1/10 sense of spin, no nystagmus   Horizontal Canal Left   Horizontal  Canal Left Duration --  2-3 seconds   Horizontal Canal Left Symptoms --  1/10 dizziness, nausea           Vestibular Treatment/Exercise - 05/27/14 1001    Vestibular Treatment/Exercise   Vestibular Treatment Provided Canalith Repositioning;Habituation   Habituation Exercises Laruth Bouchard Daroff;Horizontal Roll    EPLEY MANUEVER RIGHT   Number of Reps  1   Response Details  --  pt c/o vertigo did not stop in position 1, nystagmus slowed   Nestor Lewandowsky   Number of Reps  3   Symptom Description  --  mild vertigo to the R noted on 2nd-3rd rep   Horizontal Roll   Number of Reps  3   Symptom Description  --  mild nausea                 PT Short Term Goals - 04/06/14 1136    PT SHORT TERM GOAL #1   Title same as LTG's           PT Long Term Goals - 05/15/14 1610    PT LONG  TERM GOAL #1   Title Pt. will have a negative R Dix-Hallpike to indicate that R BPPV has resolved.   Time 4   Period Weeks   Status New   PT LONG TERM GOAL #2   Title Pt. will report no vertigo with bed mobility or ambulation.   Time 4   Period Weeks   Status New   PT LONG TERM GOAL #3   Title Independent in HEP for habituation of BPPV - Brandt-Daroff exercises.   Time 4   Period Weeks   Status New               Plan - 05/27/14 1225    Clinical Impression Statement The patient may have multi-factorial nausea/dizziness.  She awoke with nausea today, however reports blood sugar elevated and not eating breakfast.  She did not have time to perform the HEP as recommended and PT re-emphasized need to do daily.   PT Next Visit Plan Recheck BPPV, check habituation HEP, treat as indicated.   Consulted and Agree with Plan of Care Patient        Problem List Patient Active Problem List   Diagnosis Date Noted  . Chest pain at rest   . Hyperglycemia   . NSTEMI (non-ST elevated myocardial infarction)   . Diabetes mellitus without complication 66/59/9357  . Essential hypertension 04/28/2014  . HLD (hyperlipidemia) 04/28/2014  . Chest pain 04/28/2014  . Pain in the chest   . Fall 12/22/2013  . Concussion 12/22/2013  . Lumbar transverse process fracture 12/22/2013  . Acute blood loss anemia 12/22/2013  . Migraine 12/22/2013  . DM (diabetes mellitus) 12/22/2013  . Fracture of multiple ribs of right side 12/19/2013  . GERD (gastroesophageal reflux disease) 08/03/2011  . Gastroparesis 08/03/2011  . Anxiety 08/03/2011  . Rectocele 05/12/2011  . 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 COLONIC POLYPS, ADENOMATOUS, HX OF 05/02/2007    ,, PT 05/27/2014, 12:29 PM  [facility] [address] [place], [place], [address] Phone: [phone]   Fax:  [phone]

## 2014-05-29 ENCOUNTER — Ambulatory Visit: Payer: Commercial Managed Care - HMO | Admitting: Rehabilitative and Restorative Service Providers"

## 2014-06-03 ENCOUNTER — Ambulatory Visit: Payer: Commercial Managed Care - HMO | Admitting: Rehabilitative and Restorative Service Providers"

## 2014-06-03 DIAGNOSIS — H8111 Benign paroxysmal vertigo, right ear: Secondary | ICD-10-CM | POA: Diagnosis not present

## 2014-06-03 DIAGNOSIS — Z9181 History of falling: Secondary | ICD-10-CM | POA: Diagnosis not present

## 2014-06-03 NOTE — Therapy (Signed)
[facility] [address] [place], [place], [address] Phone: [phone]   Fax:  [phone]  Physical Therapy Treatment  Patient Details  Name: Valerie Wood MRN: [id number] Date of Birth: 1945/04/28 Referring Provider:  Mayra Neer, MD  Encounter Date: 06/03/2014      PT End of Session - 06/03/14 1419    Visit Number 6  G6   Number of Visits 8   Date for PT Re-Evaluation 06/14/14   Authorization Type [employer]   PT Start Time 7341   PT Stop Time 1015   PT Time Calculation (min) 40 min   Activity Tolerance Other (comment)  allowed rest time to allow nausea/dizziness to settle   Behavior During Therapy [facility] for tasks assessed/performed      Past Medical History  Diagnosis Date  . Hypertension   . Shortness of breath     quit smoking 2013  . Anxiety   . GERD (gastroesophageal reflux disease)   . Diabetes mellitus     pt didn't divulge DM during PAT interview but Dr. Paulene Floor nurse states that pt is type 2 diabetic, controlled by diet.  . Gastroparesis due to DM   . Hyperlipidemia   . Fibromyalgia   . Thyroid nodule   . Seasonal allergies   . Migraine   . Tobacco dependence   . Osteopenia   . Stricture and stenosis of esophagus   . Duodenitis without mention of hemorrhage   . 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 Esophagitis, unspecified   . Hx of colonic polyps   . Uterine cancer 1989    Past Surgical History  Procedure Laterality Date  . Cholecystectomy    . Abdominal hysterectomy  12/1987    BSO  . Appendectomy    . Tonsillectomy    . Rectocele repair  05/11/2011    Procedure: POSTERIOR REPAIR (RECTOCELE);  Surgeon: Cheri Fowler, MD;  Location: [facility];  Service: Gynecology;  Laterality: N/A;  . Colonoscopy      multiple  . Esophagogastroduodenoscopy      multiple  . Tubal ligation    . Bladder surgery      Tack     There were no vitals filed for this  visit.  Visit Diagnosis:  BPPV (benign paroxysmal positional vertigo), right      Subjective Assessment - 06/03/14 0937    Subjective The patient reports that her dizziness was worse the past 4 days.  She had increased symptoms on Friday.  She reports that now symptoms are improved.  Last week she describes her episode as dizziness even when sitting still and worse with movement.  "Sometimes I feel weak", even though I'm not spinning.  She describes occasional headaches and sinus issues.  She felt symptoms worse on Thursday.     Currently in Pain? No/denies          Vestibular Assessment - 06/03/14 1423    Positional Testing   Dix-Hallpike Dix-Hallpike Right;Dix-Hallpike Left   Sidelying Test Sidelying Right;Sidelying Left   Horizontal Canal Testing Horizontal Canal Right;Horizontal Canal Left   Dix-Hallpike Right   Dix-Hallpike Right Duration --  seconds   Dix-Hallpike Right Symptoms Upbeat, right rotatory nystagmus   Sidelying Right   Sidelying Right Duration --  >30 seconds   Sidelying Right Symptoms Upbeat, right rotatory nystagmus   Sidelying Left   Sidelying Left Symptoms No nystagmus   Horizontal Canal Right   Horizontal Canal Right Symptoms Normal  Horizontal Canal Left   Horizontal Canal Left Symptoms Normal          Vestibular Treatment/Exercise - 06/03/14 0946    Vestibular Treatment/Exercise   Vestibular Treatment Provided Canalith Repositioning;Habituation   Canalith Repositioning Epley Manuever Right    EPLEY MANUEVER RIGHT   Number of Reps  3   Overall Response Improved Symptoms   Response Details  --  mild symptoms during 3rd rep still noted   Nestor Lewandowsky   Symptom Description  reviewed verbally to hold until Friday and restart HEP if still dizzy            PT Short Term Goals - 04/06/14 1136    PT SHORT TERM GOAL #1   Title same as LTG's           PT Long Term Goals - 06/03/14 1421    PT LONG TERM GOAL #1   Title Pt. will have a  negative R Dix-Hallpike to indicate that R BPPV has resolved.   Baseline Due on 06/14/2014   Time 4   Period Weeks   Status New   PT LONG TERM GOAL #2   Title Pt. will report no vertigo with bed mobility or ambulation.   Time 4   Period Weeks   Status New   PT LONG TERM GOAL #3   Title Independent in HEP for habituation of BPPV - Brandt-Daroff exercises.   Time 4   Period Weeks   Status New               Plan - 06/03/14 1421    Clinical Impression Statement The patient has worsening of symptoms over past week.  She presents today with R posterior canalithiasis.  She has improved symptoms with Epley's canolith repositioning, however symptoms not fully resolved.  Recommended patient return to habituation exercise in 2 days at home if symtpoms persist.   PT Next Visit Plan Recheck BPPV, check habituation HEP, treat as indicated.   Consulted and Agree with Plan of Care Patient        Problem List Patient Active Problem List   Diagnosis Date Noted  . Chest pain at rest   . Hyperglycemia   . NSTEMI (non-ST elevated myocardial infarction)   . Diabetes mellitus without complication 53/61/4431  . Essential hypertension 04/28/2014  . HLD (hyperlipidemia) 04/28/2014  . Chest pain 04/28/2014  . Pain in the chest   . Fall 12/22/2013  . Concussion 12/22/2013  . Lumbar transverse process fracture 12/22/2013  . Acute blood loss anemia 12/22/2013  . Migraine 12/22/2013  . DM (diabetes mellitus) 12/22/2013  . Fracture of multiple ribs of right side 12/19/2013  . GERD (gastroesophageal reflux disease) 08/03/2011  . Gastroparesis 08/03/2011  . Anxiety 08/03/2011  . Rectocele 05/12/2011  . 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 COLONIC POLYPS, ADENOMATOUS, HX OF 05/02/2007    ,, PT 06/03/2014, 2:26 PM  [facility] [address] [place], [place], [address] Phone: [phone]   Fax:  [phone]

## 2014-06-03 NOTE — Patient Instructions (Signed)
HOLD CURRENT EXERCISES UNTIL Friday 06/05/14 Return to doing sit<>lying on your side for your home exercises if you are still dizzy.

## 2014-06-05 ENCOUNTER — Ambulatory Visit: Payer: Commercial Managed Care - HMO | Admitting: Rehabilitative and Restorative Service Providers"

## 2014-06-15 ENCOUNTER — Ambulatory Visit: Payer: Commercial Managed Care - HMO | Attending: Internal Medicine | Admitting: Physical Therapy

## 2014-06-15 ENCOUNTER — Encounter: Payer: Self-pay | Admitting: Physical Therapy

## 2014-06-15 DIAGNOSIS — Z9181 History of falling: Secondary | ICD-10-CM | POA: Insufficient documentation

## 2014-06-15 DIAGNOSIS — H8112 Benign paroxysmal vertigo, left ear: Secondary | ICD-10-CM

## 2014-06-15 DIAGNOSIS — H8111 Benign paroxysmal vertigo, right ear: Secondary | ICD-10-CM | POA: Insufficient documentation

## 2014-06-15 NOTE — Therapy (Signed)
[facility] [address] [place], [place], [address] Phone: [phone]   Fax:  [phone]  Physical Therapy Treatment  Patient Details  Name: Valerie Wood MRN: [id number] Date of Birth: 08-26-1945 Referring Provider:  Mayra Neer, MD  Encounter Date: 06/15/2014      PT End of Session - 06/15/14 2123    Visit Number 7  G7   Number of Visits 8   Date for PT Re-Evaluation 06/14/14   Authorization Type [employer]   PT Start Time 1017   PT Stop Time 1100   PT Time Calculation (min) 43 min      Past Medical History  Diagnosis Date  . Hypertension   . Shortness of breath     quit smoking 2013  . Anxiety   . GERD (gastroesophageal reflux disease)   . Diabetes mellitus     pt didn't divulge DM during PAT interview but Dr. Paulene Floor nurse states that pt is type 2 diabetic, controlled by diet.  . Gastroparesis due to DM   . Hyperlipidemia   . Fibromyalgia   . Thyroid nodule   . Seasonal allergies   . Migraine   . Tobacco dependence   . Osteopenia   . Stricture and stenosis of esophagus   . Duodenitis without mention of hemorrhage   . 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 Esophagitis, unspecified   . Hx of colonic polyps   . Uterine cancer 1989    Past Surgical History  Procedure Laterality Date  . Cholecystectomy    . Abdominal hysterectomy  12/1987    BSO  . Appendectomy    . Tonsillectomy    . Rectocele repair  05/11/2011    Procedure: POSTERIOR REPAIR (RECTOCELE);  Surgeon: Cheri Fowler, MD;  Location: [facility];  Service: Gynecology;  Laterality: N/A;  . Colonoscopy      multiple  . Esophagogastroduodenoscopy      multiple  . Tubal ligation    . Bladder surgery      Tack     There were no vitals filed for this visit.  Visit Diagnosis:  BPPV (benign paroxysmal positional vertigo), right  BPPV (benign paroxysmal positional vertigo), left      Subjective Assessment -  06/15/14 2120    Subjective Pt. reports she is still having dizziness - feels it when she turns over in bed; states she has dizziness with rolling onto R and L side   Pertinent History herniated disc in low back; reports intermittent episodes of vertigo since 1991; vertigo has been rather constant since accident on 12-19-13   Patient Stated Goals resolve the vertigo   Currently in Pain? No/denies      NeuroRe-ed; positional testing - right sidelying test positive for c/o vertigo but no nystagmus noted in test position; L sidelying test positive for c/o more severe vertigo but no nystagmus noted;  R and L Dix-Hallpike tests (+) with [employer], upbeating nystagmus and c/o vertigo  Epley's maneuver performed 2 reps for L BPPV posterior canalithiasis and 3 reps for R BPPV - posterior canalithiasis  Self Care;  Reviewed HEP for Brandt-Daroff for BPPV - pt. had greater c/o vertigo with R Dix-Hallpike test than with L Dix-Hallpike                           PT Education - 06/15/14 2121    Education provided Yes   Education Details instructed pt to  perform Brandt-Daroff exercises if she has any vertigo after rx today - recommended not to do exercises today but tomorrow if vertigo persists   Person(s) Educated Patient   Methods Explanation   Comprehension Verbalized understanding          PT Short Term Goals - 04/06/14 1136    PT SHORT TERM GOAL #1   Title same as LTG's           PT Long Term Goals - 06/03/14 1421    PT LONG TERM GOAL #1   Title Pt. will have a negative R Dix-Hallpike to indicate that R BPPV has resolved.   Baseline Due on 06/14/2014   Time 4   Period Weeks   Status New   PT LONG TERM GOAL #2   Title Pt. will report no vertigo with bed mobility or ambulation.   Time 4   Period Weeks   Status New   PT LONG TERM GOAL #3   Title Independent in HEP for habituation of BPPV - Brandt-Daroff exercises.   Time 4   Period Weeks   Status New                Plan - 06/15/14 2123    Clinical Impression Statement Pt. has signs and symptoms consistent with bil. BPPV - R and L posterior canalithiasis; appears to have mostly resolved with Epley's maneuver ( both sides)   Pt will benefit from skilled therapeutic intervention in order to improve on the following deficits Decreased balance;Decreased mobility   Rehab Potential Good   PT Frequency 1x / week   PT Duration 4 weeks   PT Treatment/Interventions ADLs/Self Care Home Management;Therapeutic activities;Patient/family education;Therapeutic exercise;Neuromuscular re-education;Balance training;Other (comment)   PT Next Visit Plan Recheck BPPV (R and L posterior canal) ,  check habituation HEP, treat as indicated.   PT Home Exercise Plan Brandt-Daroff as needed if vertigo re-occurs   Consulted and Agree with Plan of Care Patient        Problem List Patient Active Problem List   Diagnosis Date Noted  . Chest pain at rest   . Hyperglycemia   . NSTEMI (non-ST elevated myocardial infarction)   . Diabetes mellitus without complication 16/57/9038  . Essential hypertension 04/28/2014  . HLD (hyperlipidemia) 04/28/2014  . Chest pain 04/28/2014  . Pain in the chest   . Fall 12/22/2013  . Concussion 12/22/2013  . Lumbar transverse process fracture 12/22/2013  . Acute blood loss anemia 12/22/2013  . Migraine 12/22/2013  . DM (diabetes mellitus) 12/22/2013  . Fracture of multiple ribs of right side 12/19/2013  . GERD (gastroesophageal reflux disease) 08/03/2011  . Gastroparesis 08/03/2011  . Anxiety 08/03/2011  . Rectocele 05/12/2011  . 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 COLONIC POLYPS, ADENOMATOUS, HX OF 05/02/2007    Alda Lea, PT 06/15/2014, 9:27 PM  [facility] [address] [place], [place], [address] Phone: [phone]   Fax:  [phone]

## 2014-06-22 ENCOUNTER — Ambulatory Visit: Payer: Commercial Managed Care - HMO | Admitting: Rehabilitative and Restorative Service Providers"

## 2014-06-22 DIAGNOSIS — Z9181 History of falling: Secondary | ICD-10-CM | POA: Diagnosis not present

## 2014-06-22 DIAGNOSIS — H8111 Benign paroxysmal vertigo, right ear: Secondary | ICD-10-CM | POA: Diagnosis not present

## 2014-06-22 NOTE — Patient Instructions (Signed)
Tip Card 1.The goal of habituation training is to assist in decreasing symptoms of vertigo, dizziness, or nausea provoked by specific head and body motions. 2.These exercises may initially increase symptoms; however, be persistent and work through symptoms. With repetition and time, the exercises will assist in reducing or eliminating symptoms. 3.Exercises should be stopped and discussed with the therapist if you experience any of the following: - Sudden change or fluctuation in hearing - New onset of ringing in the ears, or increase in current intensity - Any fluid discharge from the ear - Severe pain in neck or back - Extreme nausea  Copyright  VHI. All rights reserved.  Rolling *DO THIS ONE FIRST   With pillow under head, start on back. Roll to your right side.  Hold until dizziness stops, plus 20 seconds and then roll to the left side.  Hold until dizziness stops, plus 20 seconds.  Repeat sequence 3 times per session. Do 2 sessions per day.  FOCUS ON GOING FAST, REST UNTIL SYMPTOMS STOP.  Copyright  VHI. All rights reserved.  Sit to Side-Lying   FOCUS ON FAST MOVEMENT, LOOKING TOWARDS CEILING. Sit on edge of bed. Lie down onto the right side and hold until dizziness stops, plus 20 seconds.  Return to sitting and wait until dizziness stops, plus 20 seconds.  Repeat to the left side. Repeat sequence 3 times per session. Do 2 sessions per day.  Copyright  VHI. All rights reserved.  Gaze Stabilization: Tip Card 1.Target must remain in focus, not blurry, and appear stationary while head is in motion. 2.Perform exercises with small head movements (45 to either side of midline). 3.Increase speed of head motion so long as target is in focus. 4.If you wear eyeglasses, be sure you can see target through lens (therapist will give specific instructions for bifocal / progressive lenses). 5.These exercises may provoke dizziness or nausea. Work through these symptoms. If too dizzy, slow head  movement slightly. Rest between each exercise. 6.Exercises demand concentration; avoid distractions. 7.For safety, perform standing exercises close to a counter, wall, corner, or next to someone.  Copyright  VHI. All rights reserved.  Gaze Stabilization: DO SEATED**    SITTING DOWN Feet shoulder width apart, keeping eyes on target on wall 3 feet away, tilt head down slightly and move head side to side for 30 seconds. Do 2 sessions per day.  Copyright  VHI. All rights reserved.   Special Instructions: Exercises may bring on mild to moderate symptoms of nausea, dizziness that resolve within 30 minutes of completing exercises. If symptoms are lasting longer than 30 minutes, modify your exercises by:  >decreasing the # of times you complete each activity >ensuring your symptoms return to baseline before moving onto the next exercise >dividing up exercises so you do not do them all in one session, but multiple short sessions throughout the day >doing them once a day until symptoms improve

## 2014-06-22 NOTE — Therapy (Signed)
[facility] [address] [place], [place], [address] Phone: [phone]   Fax:  [phone]  Physical Therapy Treatment  Patient Details  Name: Valerie Wood MRN: [id number] Date of Birth: 1945-06-06 Referring Provider:  Mayra Neer, MD  Encounter Date: 06/22/2014      PT End of Session - 06/22/14 1044    Visit Number 8  G8   Number of Visits 12   Date for PT Re-Evaluation 07/23/14   Authorization Type [employer]   PT Start Time 1020   PT Stop Time 1103   PT Time Calculation (min) 43 min   Activity Tolerance --  time to rest between repetitions due to dizziness   Behavior During Therapy [facility] for tasks assessed/performed      Past Medical History  Diagnosis Date  . Hypertension   . Shortness of breath     quit smoking 2013  . Anxiety   . GERD (gastroesophageal reflux disease)   . Diabetes mellitus     pt didn't divulge DM during PAT interview but Dr. Paulene Floor nurse states that pt is type 2 diabetic, controlled by diet.  . Gastroparesis due to DM   . Hyperlipidemia   . Fibromyalgia   . Thyroid nodule   . Seasonal allergies   . Migraine   . Tobacco dependence   . Osteopenia   . Stricture and stenosis of esophagus   . Duodenitis without mention of hemorrhage   . 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 Esophagitis, unspecified   . Hx of colonic polyps   . Uterine cancer 1989    Past Surgical History  Procedure Laterality Date  . Cholecystectomy    . Abdominal hysterectomy  12/1987    BSO  . Appendectomy    . Tonsillectomy    . Rectocele repair  05/11/2011    Procedure: POSTERIOR REPAIR (RECTOCELE);  Surgeon: Cheri Fowler, MD;  Location: [facility];  Service: Gynecology;  Laterality: N/A;  . Colonoscopy      multiple  . Esophagogastroduodenoscopy      multiple  . Tubal ligation    . Bladder surgery      Tack     There were no vitals filed for this visit.  Visit Diagnosis:   BPPV (benign paroxysmal positional vertigo), right      Subjective Assessment - 06/22/14 1022    Subjective The patient reports her dizziness was improved the day of Epley's last session.  She then woke up the next day with increased dizziness.  She reports a h/o migraines, however only 1 x since accident.   She  notes dizziness/vertigo affects her about 4 out of 7 days on average.  She reports symptoms can vary in intensity.  She reports dizzy sensation baseline today=3/10.  She doesn't feel spinning, but more describes a "weakness".  The exercises appear to make symptoms worse for hours after finishing activities.   Currently in Pain? No/denies                Vestibular Assessment - 06/22/14 1030    Positional Testing   Dix-Hallpike Dix-Hallpike Right;Dix-Hallpike Left   Sidelying Test Sidelying Right;Sidelying Left   Horizontal Canal Testing Horizontal Canal Right;Horizontal Canal Left   Sidelying Right   Sidelying Right Duration 10 seconds   Sidelying Right Symptoms Upbeat, right rotatory nystagmus   Sidelying Left   Sidelying Left Symptoms No nystagmus   Horizontal Canal Right   Horizontal Canal Right Symptoms Normal  no nystagmus, however mild sense of dizziness x 2-3 sec   Horizontal Canal Left   Horizontal Canal Left Symptoms Normal                  Vestibular Treatment/Exercise - 06/22/14 1040    Vestibular Treatment/Exercise   Vestibular Treatment Provided Habituation;Gaze   Habituation Exercises Laruth Bouchard Daroff;Horizontal Roll   Gaze Exercises X1 Viewing Horizontal   Nestor Lewandowsky   Number of Reps  5  emphasizing speed of movement   Symptom Description  --  repeated 2 reps, then rested, then did 3 reps   Horizontal Roll   Number of Reps  3   Symptom Description  trace subjective dizziness with R rolling      SELF CARE/HOME MANAGEMENT: Discussed management of symptoms and modifying HEP to patient tolerance.  Recommended 3 reps of habituation vs. 5  reps and to do when she can rest for 30 minutes post exercise.  Also reviewed allowing symptoms to return to baseline before performing next exercise.       PT Education - 06/22/14 2123    Education provided Yes   Education Details Pt instructed to do horizontal roll, brandt daroff, and gaze for mgmt of chronic, intermittent BPPV   Person(s) Educated Patient   Methods Explanation;Demonstration;Handout   Comprehension Returned demonstration;Verbalized understanding          PT Short Term Goals - 04/06/14 1136    PT SHORT TERM GOAL #1   Title same as LTG's           PT Long Term Goals - 06/22/14 2125    PT LONG TERM GOAL #1   Title Pt. will have a negative R Dix-Hallpike to indicate that R BPPV has resolved.   Baseline Modified target date to 07/15/2014   Time 4   Period Weeks   Status On-going   PT LONG TERM GOAL #2   Title Pt. will report no vertigo with bed mobility or ambulation.   Baseline Modified target date 07/15/2014   Time 4   Period Weeks   Status On-going   PT LONG TERM GOAL #3   Title Independent in HEP for habituation of BPPV - Brandt-Daroff exercises.   Baseline Met on 06/22/2014 with cues to perform lower repetition to make exercise more tolerable.   Time 4   Period Weeks   Status Achieved               Plan - 06/22/14 2126    Clinical Impression Statement The patient c/o intermittent vertigo episodes.  She responds to Epley's maneuver, but appears to have frequent recurrence.  The patient is continuing with vertigo, occasional nausea and instability.  PT recommended she perform habituation to not only treat recurring BPPV, but to also provide habituation due to post concussive motion sensitivity.  The patient was not performing activities at regular intervals due to exercises provoking symptoms.  PT reviewed managment of symptoms and how exercise improves function.   Pt will benefit from skilled therapeutic intervention in order to improve on the  following deficits Decreased balance;Decreased mobility  recurrent BPPV, dizziness   Rehab Potential Good   PT Frequency 1x / week   PT Duration 4 weeks   PT Treatment/Interventions ADLs/Self Care Home Management;Therapeutic activities;Patient/family education;Therapeutic exercise;Neuromuscular re-education;Balance training;Other (comment)  vestibular rehab   PT Next Visit Plan Review home program, begin d/c planning with focus on self management.   Consulted and Agree with Plan of Care Patient  Problem List Patient Active Problem List   Diagnosis Date Noted  . Chest pain at rest   . Hyperglycemia   . NSTEMI (non-ST elevated myocardial infarction)   . Diabetes mellitus without complication 91/66/0600  . Essential hypertension 04/28/2014  . HLD (hyperlipidemia) 04/28/2014  . Chest pain 04/28/2014  . Pain in the chest   . Fall 12/22/2013  . Concussion 12/22/2013  . Lumbar transverse process fracture 12/22/2013  . Acute blood loss anemia 12/22/2013  . Migraine 12/22/2013  . DM (diabetes mellitus) 12/22/2013  . Fracture of multiple ribs of right side 12/19/2013  . GERD (gastroesophageal reflux disease) 08/03/2011  . Gastroparesis 08/03/2011  . Anxiety 08/03/2011  . Rectocele 05/12/2011  . 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 COLONIC POLYPS, ADENOMATOUS, HX OF 05/02/2007    ,, PT 06/22/2014, 9:30 PM  [facility] [address] [place], [place], [address] Phone: [phone]   Fax:  [phone]

## 2014-06-29 ENCOUNTER — Ambulatory Visit: Payer: Commercial Managed Care - HMO | Admitting: Rehabilitative and Restorative Service Providers"

## 2014-06-29 DIAGNOSIS — H8111 Benign paroxysmal vertigo, right ear: Secondary | ICD-10-CM | POA: Diagnosis not present

## 2014-06-29 DIAGNOSIS — Z9181 History of falling: Secondary | ICD-10-CM | POA: Diagnosis not present

## 2014-06-29 NOTE — Therapy (Signed)
[facility] [address] [place], [place], [address] Phone: [phone]   Fax:  [phone]  Physical Therapy Treatment  Patient Details  Name: Valerie Wood MRN: [id number] Date of Birth: [birth year] Referring Provider:  Mayra Neer, MD  Encounter Date: 06/29/2014      PT End of Session - 06/29/14 1053    Visit Number 9  G9   Number of Visits 12   Date for PT Re-Evaluation 07/23/14   Authorization Type [employer]   PT Start Time 1018   PT Stop Time 1050   PT Time Calculation (min) 32 min   Activity Tolerance Patient tolerated treatment well   Behavior During Therapy [facility] for tasks assessed/performed      Past Medical History  Diagnosis Date  . Hypertension   . Shortness of breath     quit smoking 2013  . Anxiety   . GERD (gastroesophageal reflux disease)   . Diabetes mellitus     pt didn't divulge DM during PAT interview but Dr. Paulene Floor nurse states that pt is type 2 diabetic, controlled by diet.  . Gastroparesis due to DM   . Hyperlipidemia   . Fibromyalgia   . Thyroid nodule   . Seasonal allergies   . Migraine   . Tobacco dependence   . Osteopenia   . Stricture and stenosis of esophagus   . Duodenitis without mention of hemorrhage   . 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 Esophagitis, unspecified   . Hx of colonic polyps   . Uterine cancer 1989    Past Surgical History  Procedure Laterality Date  . Cholecystectomy    . Abdominal hysterectomy  12/1987    BSO  . Appendectomy    . Tonsillectomy    . Rectocele repair  05/11/2011    Procedure: POSTERIOR REPAIR (RECTOCELE);  Surgeon: Cheri Fowler, MD;  Location: [facility];  Service: Gynecology;  Laterality: N/A;  . Colonoscopy      multiple  . Esophagogastroduodenoscopy      multiple  . Tubal ligation    . Bladder surgery      Tack     There were no vitals filed for this visit.  Visit Diagnosis:  BPPV (benign  paroxysmal positional vertigo), right      Subjective Assessment - 06/29/14 1018    Subjective The patient reports dizziness is significantly better, but still not resolved.   Currently in Pain? No/denies            [facility] Adult PT Treatment/Exercise - 06/29/14 1037    High Level Balance   High Level Balance Activities Head turns  standing in corner with feet apart and head turns   High Level Balance Comments performed with horizontal, vertical and diagonal head turns         Vestibular Treatment/Exercise - 06/29/14 1033    Vestibular Treatment/Exercise   Vestibular Treatment Provided Habituation;Gaze   Habituation Exercises Laruth Bouchard Daroff;Horizontal Roll   Gaze Exercises X1 Viewing Horizontal   Nestor Lewandowsky   Number of Reps  3   Symptom Description  minimal dizziness   Horizontal Roll   Number of Reps  3   Symptom Description  minimal dizziness   X1 Viewing Horizontal   Foot Position seated   Time --  30   Reps 2   Comments --  cues for coordination and maintaining focus          PT Short Term Goals - 04/06/14 1136  PT SHORT TERM GOAL #1   Title same as LTG's           PT Long Term Goals - 06/22/14 2125    PT LONG TERM GOAL #1   Title Pt. will have a negative R Dix-Hallpike to indicate that R BPPV has resolved.   Baseline Modified target date to 07/15/2014   Time 4   Period Weeks   Status On-going   PT LONG TERM GOAL #2   Title Pt. will report no vertigo with bed mobility or ambulation.   Baseline Modified target date 07/15/2014   Time 4   Period Weeks   Status On-going   PT LONG TERM GOAL #3   Title Independent in HEP for habituation of BPPV - Brandt-Daroff exercises.   Baseline Met on 06/22/2014 with cues to perform lower repetition to make exercise more tolerable.   Time 4   Period Weeks   Status Achieved               Plan - 06/29/14 1055    Clinical Impression Statement The patient is able to continue HEP.  She reports since doing  HEP regularly, she notes improved function without dizziness limiting her.  She notes dizziness when her head is in different positions, such as leaning forward to rinse mouth out or looking under a table.   PT Next Visit Plan review HEP, discharge if going well.   Consulted and Agree with Plan of Care Patient        Problem List Patient Active Problem List   Diagnosis Date Noted  . Chest pain at rest   . Hyperglycemia   . NSTEMI (non-ST elevated myocardial infarction)   . Diabetes mellitus without complication 60/63/0160  . Essential hypertension 04/28/2014  . HLD (hyperlipidemia) 04/28/2014  . Chest pain 04/28/2014  . Pain in the chest   . Fall 12/22/2013  . Concussion 12/22/2013  . Lumbar transverse process fracture 12/22/2013  . Acute blood loss anemia 12/22/2013  . Migraine 12/22/2013  . DM (diabetes mellitus) 12/22/2013  . Fracture of multiple ribs of right side 12/19/2013  . GERD (gastroesophageal reflux disease) 08/03/2011  . Gastroparesis 08/03/2011  . Anxiety 08/03/2011  . Rectocele 05/12/2011  . 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 COLONIC POLYPS, ADENOMATOUS, HX OF 05/02/2007    ,, PT 06/29/2014, 10:57 AM  [facility] [address] [place], [place], [address] Phone: [phone]   Fax:  [phone]

## 2014-07-17 ENCOUNTER — Ambulatory Visit
Payer: Commercial Managed Care - HMO | Attending: Internal Medicine | Admitting: Rehabilitative and Restorative Service Providers"

## 2014-07-17 ENCOUNTER — Telehealth: Payer: Self-pay | Admitting: Rehabilitative and Restorative Service Providers"

## 2014-07-17 DIAGNOSIS — H8111 Benign paroxysmal vertigo, right ear: Secondary | ICD-10-CM | POA: Insufficient documentation

## 2014-07-17 NOTE — Therapy (Signed)
[facility] [address] [place], [place], [address] Phone: [phone]   Fax:  [phone]  Physical Therapy Treatment  Patient Details  Name: Valerie Wood MRN: [id number] Date of Birth: Sep 01, 1945 Referring Provider:  Mayra Neer, MD  Encounter Date: 07/17/2014      PT End of Session - 07/17/14 1037    Visit Number 10  G10   Number of Visits 12   Date for PT Re-Evaluation 07/23/14   Authorization Type [employer]   PT Start Time 1020   PT Stop Time 1050   PT Time Calculation (min) 30 min   Activity Tolerance Patient tolerated treatment well   Behavior During Therapy [facility] for tasks assessed/performed      Past Medical History  Diagnosis Date  . Hypertension   . Shortness of breath     quit smoking 2013  . Anxiety   . GERD (gastroesophageal reflux disease)   . Diabetes mellitus     pt didn't divulge DM during PAT interview but Dr. Paulene Floor nurse states that pt is type 2 diabetic, controlled by diet.  . Gastroparesis due to DM   . Hyperlipidemia   . Fibromyalgia   . Thyroid nodule   . Seasonal allergies   . Migraine   . Tobacco dependence   . Osteopenia   . Stricture and stenosis of esophagus   . Duodenitis without mention of hemorrhage   . 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 Esophagitis, unspecified   . Hx of colonic polyps   . Uterine cancer 1989    Past Surgical History  Procedure Laterality Date  . Cholecystectomy    . Abdominal hysterectomy  12/1987    BSO  . Appendectomy    . Tonsillectomy    . Rectocele repair  05/11/2011    Procedure: POSTERIOR REPAIR (RECTOCELE);  Surgeon: Cheri Fowler, MD;  Location: [facility];  Service: Gynecology;  Laterality: N/A;  . Colonoscopy      multiple  . Esophagogastroduodenoscopy      multiple  . Tubal ligation    . Bladder surgery      Tack     There were no vitals filed for this visit.  Visit Diagnosis:  BPPV (benign  paroxysmal positional vertigo), right      Subjective Assessment - 07/17/14 1023    Subjective The patient reports that she feels vertigo is impacting her <1 % of her life.  She is exercising regularly.  She reports she has been able to tolerate dental work with occasional dizziness.   Currently in Pain? No/denies      NEUROMUSCULAR RE-EDUCATION: Reviewed habituation sit<>bilateral sidelying with upbeat, rotary nystagmus noted only on first rep to the right side. Rolling R<>L repeated x 2 reps Discussed home mgmt of BPPV and when okay to stop doing exercises (pt instructions)   SELF CARE/HOME MANAGEMENT: Discussed getting on/off treadmill safely Educated on Matter of balance classes in [place] due to fear of falling Recommended patient continue walking as tolerated         PT Education - 07/17/14 1053    Education provided Yes   Education Details Getting on/off of treadmill safely, reviewed HEP, matter of balance classes/ contact info   Person(s) Educated Patient   Methods Explanation;Demonstration;Handout   Comprehension Returned demonstration;Verbalized understanding          PT Short Term Goals - 04/06/14 1136    PT SHORT TERM GOAL #1   Title same as LTG's  PT Long Term Goals - 2014-08-08 1024    PT LONG TERM GOAL #1   Title Pt. will have a negative R Dix-Hallpike to indicate that R BPPV has resolved.   Baseline Patient continues with R posterior canal BPPV that at times responds to canolith repositioning but then returns.  She has positive R sidelying test on first rep that fatigues quickly   Time 4   Period Weeks   Status Not Met   PT LONG TERM GOAL #2   Title Pt. will report no vertigo with bed mobility or ambulation.   Baseline The patient reports symptoms occur <1% of the time.   Time 4   Period Weeks   Status Partially Met   PT LONG TERM GOAL #3   Title Independent in HEP for habituation of BPPV - Brandt-Daroff exercises.   Baseline Met  on 06/22/2014 with cues to perform lower repetition to make exercise more tolerable.   Time 4   Period Weeks   Status Achieved               Plan - 08/08/2014 1054    Clinical Impression Statement The patient continues with mild R BPPV, however is able to self manage with HEP and strategies to reduce dizziness when it occurs.  She feels this is no longer limiting her functional mobility.   PT Next Visit Plan Place patient on hold for 30 days and d/c if she is managing vertigo well (if she does not call back to reschedule).   Consulted and Agree with Plan of Care Patient          G-Codes - 08-08-14 1037    Functional Limitation Other PT primary   Other PT Primary Goal Status (Z7673) At least 1 percent but less than 20 percent impaired, limited or restricted   Other PT Primary Discharge Status (A1937) At least 1 percent but less than 20 percent impaired, limited or restricted      Problem List Patient Active Problem List   Diagnosis Date Noted  . Chest pain at rest   . Hyperglycemia   . NSTEMI (non-ST elevated myocardial infarction)   . Diabetes mellitus without complication 90/24/0973  . Essential hypertension 04/28/2014  . HLD (hyperlipidemia) 04/28/2014  . Chest pain 04/28/2014  . Pain in the chest   . Fall 12/22/2013  . Concussion 12/22/2013  . Lumbar transverse process fracture 12/22/2013  . Acute blood loss anemia 12/22/2013  . Migraine 12/22/2013  . DM (diabetes mellitus) 12/22/2013  . Fracture of multiple ribs of right side 12/19/2013  . GERD (gastroesophageal reflux disease) 08/03/2011  . Gastroparesis 08/03/2011  . Anxiety 08/03/2011  . Rectocele 05/12/2011  . 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 COLONIC POLYPS, ADENOMATOUS, HX OF 05/02/2007    ,, PT 08-08-14, 11:20 AM  [facility] [address] [place], [place], [address] Phone: [phone]   Fax:  [phone]

## 2014-07-17 NOTE — Patient Instructions (Signed)
CONTINUE CURRENT EXERCISES:  Until you have 2-3 days of no dizziness when you perform exercises.  MATTER OF BALANCE CLASS: A Matter of Balance Class  Date: 05/12/2014 1:00 PM - 3:00 PM  Cost: Free  Location: [facility] [place], [place] [address]  Add to my Calendar  *May be offering another class soon*

## 2014-08-03 ENCOUNTER — Other Ambulatory Visit: Payer: Self-pay

## 2014-08-04 DIAGNOSIS — E1165 Type 2 diabetes mellitus with hyperglycemia: Secondary | ICD-10-CM | POA: Diagnosis not present

## 2014-08-04 DIAGNOSIS — Z794 Long term (current) use of insulin: Secondary | ICD-10-CM | POA: Diagnosis not present

## 2014-08-17 DIAGNOSIS — Z794 Long term (current) use of insulin: Secondary | ICD-10-CM | POA: Diagnosis not present

## 2014-08-17 DIAGNOSIS — L299 Pruritus, unspecified: Secondary | ICD-10-CM | POA: Diagnosis not present

## 2014-08-17 DIAGNOSIS — E1165 Type 2 diabetes mellitus with hyperglycemia: Secondary | ICD-10-CM | POA: Diagnosis not present

## 2014-08-18 DIAGNOSIS — Z1231 Encounter for screening mammogram for malignant neoplasm of breast: Secondary | ICD-10-CM | POA: Diagnosis not present

## 2014-08-19 ENCOUNTER — Encounter: Payer: Self-pay | Admitting: Rehabilitative and Restorative Service Providers"

## 2014-08-19 DIAGNOSIS — Z01419 Encounter for gynecological examination (general) (routine) without abnormal findings: Secondary | ICD-10-CM | POA: Diagnosis not present

## 2014-08-19 NOTE — Therapy (Signed)
[facility] [address] [place], [place], [address] Phone: [phone]   Fax:  [phone]  Patient Details  Name: Valerie Wood MRN: [id number] Date of Birth: 1945/12/15 Referring Provider:  No ref. provider found  Encounter Date: last encounter 07/17/2014 *pt to call back if any return of vertigo and did not return in 30 days.  PHYSICAL THERAPY DISCHARGE SUMMARY  Visits from Start of Care: 10  Current functional level related to goals / functional outcomes:     PT Long Term Goals - 07/17/14 1024    PT LONG TERM GOAL #1   Title Pt. will have a negative R Dix-Hallpike to indicate that R BPPV has resolved.   Baseline Patient continues with R posterior canal BPPV that at times responds to canolith repositioning but then returns.  She has positive R sidelying test on first rep that fatigues quickly   Time 4   Period Weeks   Status Not Met   PT LONG TERM GOAL #2   Title Pt. will report no vertigo with bed mobility or ambulation.   Baseline The patient reports symptoms occur <1% of the time.   Time 4   Period Weeks   Status Partially Met   PT LONG TERM GOAL #3   Title Independent in HEP for habituation of BPPV - Brandt-Daroff exercises.   Baseline Met on 06/22/2014 with cues to perform lower repetition to make exercise more tolerable.   Time 4   Period Weeks   Status Achieved        Remaining deficits: Intermittent vertigo managed via HEP.   Education / Equipment: HEP, self mgmt of BPPV, general vestibular adaptation exercises.  Plan: Patient agrees to discharge.  Patient goals were partially met. Patient is being discharged due to meeting the stated rehab goals.  ?????          Thank you for the referral of this patient.  [place], [place] 08/19/2014, 8:15 AM  [facility] [address] [place], [place], [address] Phone: [phone]    Fax:  [phone]

## 2014-08-20 ENCOUNTER — Other Ambulatory Visit: Payer: Self-pay | Admitting: Family Medicine

## 2014-08-20 DIAGNOSIS — E780 Pure hypercholesterolemia: Secondary | ICD-10-CM | POA: Diagnosis not present

## 2014-08-20 DIAGNOSIS — G43909 Migraine, unspecified, not intractable, without status migrainosus: Secondary | ICD-10-CM | POA: Diagnosis not present

## 2014-08-20 DIAGNOSIS — Z0000 Encounter for general adult medical examination without abnormal findings: Secondary | ICD-10-CM | POA: Diagnosis not present

## 2014-08-20 DIAGNOSIS — E041 Nontoxic single thyroid nodule: Secondary | ICD-10-CM | POA: Diagnosis not present

## 2014-08-20 DIAGNOSIS — E559 Vitamin D deficiency, unspecified: Secondary | ICD-10-CM | POA: Diagnosis not present

## 2014-08-20 DIAGNOSIS — F411 Generalized anxiety disorder: Secondary | ICD-10-CM | POA: Diagnosis not present

## 2014-08-20 DIAGNOSIS — E1165 Type 2 diabetes mellitus with hyperglycemia: Secondary | ICD-10-CM | POA: Diagnosis not present

## 2014-08-20 DIAGNOSIS — I10 Essential (primary) hypertension: Secondary | ICD-10-CM | POA: Diagnosis not present

## 2014-08-25 ENCOUNTER — Ambulatory Visit
Admission: RE | Admit: 2014-08-25 | Discharge: 2014-08-25 | Disposition: A | Discharge status: Home / Self Care | Payer: Commercial Managed Care - HMO | Source: Ambulatory Visit | Attending: Family Medicine | Admitting: Family Medicine

## 2014-08-25 DIAGNOSIS — E041 Nontoxic single thyroid nodule: Secondary | ICD-10-CM | POA: Diagnosis not present

## 2014-09-11 DIAGNOSIS — E1165 Type 2 diabetes mellitus with hyperglycemia: Secondary | ICD-10-CM | POA: Diagnosis not present

## 2014-09-11 DIAGNOSIS — Z794 Long term (current) use of insulin: Secondary | ICD-10-CM | POA: Diagnosis not present

## 2014-09-11 DIAGNOSIS — L299 Pruritus, unspecified: Secondary | ICD-10-CM | POA: Diagnosis not present

## 2014-09-11 DIAGNOSIS — I10 Essential (primary) hypertension: Secondary | ICD-10-CM | POA: Diagnosis not present

## 2014-11-16 DIAGNOSIS — E1165 Type 2 diabetes mellitus with hyperglycemia: Secondary | ICD-10-CM | POA: Diagnosis not present

## 2014-11-16 DIAGNOSIS — I10 Essential (primary) hypertension: Secondary | ICD-10-CM | POA: Diagnosis not present

## 2014-11-16 DIAGNOSIS — Z23 Encounter for immunization: Secondary | ICD-10-CM | POA: Diagnosis not present

## 2014-11-16 DIAGNOSIS — Z794 Long term (current) use of insulin: Secondary | ICD-10-CM | POA: Diagnosis not present

## 2014-11-16 DIAGNOSIS — L299 Pruritus, unspecified: Secondary | ICD-10-CM | POA: Diagnosis not present

## 2015-01-18 DIAGNOSIS — L299 Pruritus, unspecified: Secondary | ICD-10-CM | POA: Diagnosis not present

## 2015-01-18 DIAGNOSIS — H1132 Conjunctival hemorrhage, left eye: Secondary | ICD-10-CM | POA: Diagnosis not present

## 2015-01-18 DIAGNOSIS — Z794 Long term (current) use of insulin: Secondary | ICD-10-CM | POA: Diagnosis not present

## 2015-01-18 DIAGNOSIS — I10 Essential (primary) hypertension: Secondary | ICD-10-CM | POA: Diagnosis not present

## 2015-01-18 DIAGNOSIS — E1165 Type 2 diabetes mellitus with hyperglycemia: Secondary | ICD-10-CM | POA: Diagnosis not present

## 2015-02-07 DIAGNOSIS — I7090 Unspecified atherosclerosis: Secondary | ICD-10-CM

## 2015-02-07 HISTORY — DX: Unspecified atherosclerosis: I70.90

## 2015-02-11 DIAGNOSIS — J40 Bronchitis, not specified as acute or chronic: Secondary | ICD-10-CM | POA: Diagnosis not present

## 2015-02-24 DIAGNOSIS — J209 Acute bronchitis, unspecified: Secondary | ICD-10-CM | POA: Diagnosis not present

## 2015-03-15 DIAGNOSIS — Z794 Long term (current) use of insulin: Secondary | ICD-10-CM | POA: Diagnosis not present

## 2015-03-15 DIAGNOSIS — E7800 Pure hypercholesterolemia, unspecified: Secondary | ICD-10-CM | POA: Diagnosis not present

## 2015-03-15 DIAGNOSIS — I10 Essential (primary) hypertension: Secondary | ICD-10-CM | POA: Diagnosis not present

## 2015-03-15 DIAGNOSIS — E1165 Type 2 diabetes mellitus with hyperglycemia: Secondary | ICD-10-CM | POA: Diagnosis not present

## 2015-04-14 DIAGNOSIS — R350 Frequency of micturition: Secondary | ICD-10-CM | POA: Diagnosis not present

## 2015-04-26 ENCOUNTER — Other Ambulatory Visit: Payer: Self-pay | Admitting: Physician Assistant

## 2015-04-26 DIAGNOSIS — R109 Unspecified abdominal pain: Secondary | ICD-10-CM | POA: Diagnosis not present

## 2015-04-30 ENCOUNTER — Ambulatory Visit
Admission: RE | Admit: 2015-04-30 | Discharge: 2015-04-30 | Disposition: A | Discharge status: Home / Self Care | Payer: Commercial Managed Care - HMO | Source: Ambulatory Visit | Attending: Physician Assistant | Admitting: Physician Assistant

## 2015-04-30 DIAGNOSIS — R109 Unspecified abdominal pain: Secondary | ICD-10-CM

## 2015-04-30 DIAGNOSIS — R1031 Right lower quadrant pain: Secondary | ICD-10-CM | POA: Diagnosis not present

## 2015-04-30 MED ORDER — IOPAMIDOL (ISOVUE-300) INJECTION 61%
100.0000 mL | Freq: Once | INTRAVENOUS | Status: AC | PRN
  Administered 2015-04-30: 100 mL via INTRAVENOUS

## 2015-05-11 DIAGNOSIS — R300 Dysuria: Secondary | ICD-10-CM | POA: Diagnosis not present

## 2015-05-17 DIAGNOSIS — R1031 Right lower quadrant pain: Secondary | ICD-10-CM | POA: Diagnosis not present

## 2015-05-17 DIAGNOSIS — Z0000 Encounter for general adult medical examination without abnormal findings: Secondary | ICD-10-CM | POA: Diagnosis not present

## 2015-05-17 DIAGNOSIS — N3281 Overactive bladder: Secondary | ICD-10-CM | POA: Diagnosis not present

## 2015-05-17 DIAGNOSIS — N281 Cyst of kidney, acquired: Secondary | ICD-10-CM | POA: Diagnosis not present

## 2015-05-20 DIAGNOSIS — Z794 Long term (current) use of insulin: Secondary | ICD-10-CM | POA: Diagnosis not present

## 2015-05-20 DIAGNOSIS — I10 Essential (primary) hypertension: Secondary | ICD-10-CM | POA: Diagnosis not present

## 2015-05-20 DIAGNOSIS — E1165 Type 2 diabetes mellitus with hyperglycemia: Secondary | ICD-10-CM | POA: Diagnosis not present

## 2015-05-31 DIAGNOSIS — R109 Unspecified abdominal pain: Secondary | ICD-10-CM | POA: Diagnosis not present

## 2015-05-31 DIAGNOSIS — M549 Dorsalgia, unspecified: Secondary | ICD-10-CM | POA: Diagnosis not present

## 2015-06-21 DIAGNOSIS — H5210 Myopia, unspecified eye: Secondary | ICD-10-CM | POA: Diagnosis not present

## 2015-06-21 DIAGNOSIS — I10 Essential (primary) hypertension: Secondary | ICD-10-CM | POA: Diagnosis not present

## 2015-06-21 DIAGNOSIS — H2510 Age-related nuclear cataract, unspecified eye: Secondary | ICD-10-CM | POA: Diagnosis not present

## 2015-06-21 DIAGNOSIS — E109 Type 1 diabetes mellitus without complications: Secondary | ICD-10-CM | POA: Diagnosis not present

## 2015-08-17 DIAGNOSIS — N281 Cyst of kidney, acquired: Secondary | ICD-10-CM | POA: Diagnosis not present

## 2015-08-17 DIAGNOSIS — R3915 Urgency of urination: Secondary | ICD-10-CM | POA: Diagnosis not present

## 2015-08-20 DIAGNOSIS — I10 Essential (primary) hypertension: Secondary | ICD-10-CM | POA: Diagnosis not present

## 2015-08-20 DIAGNOSIS — Z794 Long term (current) use of insulin: Secondary | ICD-10-CM | POA: Diagnosis not present

## 2015-08-20 DIAGNOSIS — E1165 Type 2 diabetes mellitus with hyperglycemia: Secondary | ICD-10-CM | POA: Diagnosis not present

## 2015-08-30 ENCOUNTER — Other Ambulatory Visit: Payer: Self-pay | Admitting: Family Medicine

## 2015-08-30 DIAGNOSIS — E041 Nontoxic single thyroid nodule: Secondary | ICD-10-CM

## 2015-09-07 ENCOUNTER — Ambulatory Visit
Admission: RE | Admit: 2015-09-07 | Discharge: 2015-09-07 | Disposition: A | Discharge status: Home / Self Care | Payer: Commercial Managed Care - HMO | Source: Ambulatory Visit | Attending: Family Medicine | Admitting: Family Medicine

## 2015-09-07 DIAGNOSIS — E041 Nontoxic single thyroid nodule: Secondary | ICD-10-CM

## 2015-09-07 DIAGNOSIS — E042 Nontoxic multinodular goiter: Secondary | ICD-10-CM | POA: Diagnosis not present

## 2015-09-08 DIAGNOSIS — H40013 Open angle with borderline findings, low risk, bilateral: Secondary | ICD-10-CM | POA: Diagnosis not present

## 2015-09-13 DIAGNOSIS — Z803 Family history of malignant neoplasm of breast: Secondary | ICD-10-CM | POA: Diagnosis not present

## 2015-09-13 DIAGNOSIS — Z1231 Encounter for screening mammogram for malignant neoplasm of breast: Secondary | ICD-10-CM | POA: Diagnosis not present

## 2015-10-29 DIAGNOSIS — M899 Disorder of bone, unspecified: Secondary | ICD-10-CM | POA: Diagnosis not present

## 2015-10-29 DIAGNOSIS — E1165 Type 2 diabetes mellitus with hyperglycemia: Secondary | ICD-10-CM | POA: Diagnosis not present

## 2015-10-29 DIAGNOSIS — E559 Vitamin D deficiency, unspecified: Secondary | ICD-10-CM | POA: Diagnosis not present

## 2015-10-29 DIAGNOSIS — Z0000 Encounter for general adult medical examination without abnormal findings: Secondary | ICD-10-CM | POA: Diagnosis not present

## 2015-10-29 DIAGNOSIS — E7800 Pure hypercholesterolemia, unspecified: Secondary | ICD-10-CM | POA: Diagnosis not present

## 2015-10-29 DIAGNOSIS — Z8541 Personal history of malignant neoplasm of cervix uteri: Secondary | ICD-10-CM | POA: Diagnosis not present

## 2015-10-29 DIAGNOSIS — Z23 Encounter for immunization: Secondary | ICD-10-CM | POA: Diagnosis not present

## 2015-10-29 DIAGNOSIS — M1990 Unspecified osteoarthritis, unspecified site: Secondary | ICD-10-CM | POA: Diagnosis not present

## 2015-10-29 DIAGNOSIS — I10 Essential (primary) hypertension: Secondary | ICD-10-CM | POA: Diagnosis not present

## 2015-10-29 DIAGNOSIS — M549 Dorsalgia, unspecified: Secondary | ICD-10-CM | POA: Diagnosis not present

## 2015-11-01 DIAGNOSIS — M19042 Primary osteoarthritis, left hand: Secondary | ICD-10-CM | POA: Diagnosis not present

## 2015-11-01 DIAGNOSIS — M19041 Primary osteoarthritis, right hand: Secondary | ICD-10-CM | POA: Diagnosis not present

## 2015-11-22 DIAGNOSIS — I10 Essential (primary) hypertension: Secondary | ICD-10-CM | POA: Diagnosis not present

## 2015-11-22 DIAGNOSIS — E119 Type 2 diabetes mellitus without complications: Secondary | ICD-10-CM | POA: Diagnosis not present

## 2015-11-22 DIAGNOSIS — Z8349 Family history of other endocrine, nutritional and metabolic diseases: Secondary | ICD-10-CM | POA: Diagnosis not present

## 2015-11-22 DIAGNOSIS — Z794 Long term (current) use of insulin: Secondary | ICD-10-CM | POA: Diagnosis not present

## 2015-12-03 DIAGNOSIS — N898 Other specified noninflammatory disorders of vagina: Secondary | ICD-10-CM | POA: Diagnosis not present

## 2015-12-03 DIAGNOSIS — R8299 Other abnormal findings in urine: Secondary | ICD-10-CM | POA: Diagnosis not present

## 2015-12-03 DIAGNOSIS — N39498 Other specified urinary incontinence: Secondary | ICD-10-CM | POA: Diagnosis not present

## 2015-12-06 DIAGNOSIS — R8299 Other abnormal findings in urine: Secondary | ICD-10-CM | POA: Diagnosis not present

## 2016-04-28 ENCOUNTER — Telehealth (INDEPENDENT_AMBULATORY_CARE_PROVIDER_SITE_OTHER): Payer: Self-pay | Admitting: *Deleted

## 2016-04-28 NOTE — Telephone Encounter (Signed)
Valerie Wood called this morning in regards to an issue with a medication refill for this patient. The patient want's [place] to fill in a prescription for Celebrex? The phone number [phone]. Fax Number # [phone]. Thank you

## 2016-04-28 NOTE — Telephone Encounter (Signed)
Please advise 

## 2016-04-28 NOTE — Telephone Encounter (Signed)
Yes #[phone] 3 refils

## 2016-05-01 MED ORDER — CELECOXIB 200 MG PO CAPS: 200.0000 mg | ORAL_CAPSULE | Freq: Two times a day (BID) | ORAL | 3 refills | Status: DC

## 2016-05-01 NOTE — Telephone Encounter (Signed)
200 mg

## 2016-05-01 NOTE — Telephone Encounter (Signed)
Called pt to let her know which pharm she uses and she states she uses [facility] [place] [address] rx sent into pharm

## 2016-05-01 NOTE — Telephone Encounter (Signed)
What Mg ?

## 2016-05-01 NOTE — Addendum Note (Signed)
Addended by: Precious Bard on: 05/01/2016 02:06 PM   Modules accepted: Orders

## 2016-05-04 DIAGNOSIS — H04123 Dry eye syndrome of bilateral lacrimal glands: Secondary | ICD-10-CM | POA: Diagnosis not present

## 2016-05-04 DIAGNOSIS — H1045 Other chronic allergic conjunctivitis: Secondary | ICD-10-CM | POA: Diagnosis not present

## 2016-05-04 DIAGNOSIS — E1165 Type 2 diabetes mellitus with hyperglycemia: Secondary | ICD-10-CM | POA: Diagnosis not present

## 2016-05-04 DIAGNOSIS — I10 Essential (primary) hypertension: Secondary | ICD-10-CM | POA: Diagnosis not present

## 2016-05-04 DIAGNOSIS — G43909 Migraine, unspecified, not intractable, without status migrainosus: Secondary | ICD-10-CM | POA: Diagnosis not present

## 2016-05-04 DIAGNOSIS — R300 Dysuria: Secondary | ICD-10-CM | POA: Diagnosis not present

## 2016-05-04 DIAGNOSIS — E7800 Pure hypercholesterolemia, unspecified: Secondary | ICD-10-CM | POA: Diagnosis not present

## 2016-05-04 DIAGNOSIS — F411 Generalized anxiety disorder: Secondary | ICD-10-CM | POA: Diagnosis not present

## 2016-05-22 DIAGNOSIS — Z6831 Body mass index (BMI) 31.0-31.9, adult: Secondary | ICD-10-CM | POA: Diagnosis not present

## 2016-05-22 DIAGNOSIS — I10 Essential (primary) hypertension: Secondary | ICD-10-CM | POA: Diagnosis not present

## 2016-05-22 DIAGNOSIS — E669 Obesity, unspecified: Secondary | ICD-10-CM | POA: Diagnosis not present

## 2016-05-22 DIAGNOSIS — R300 Dysuria: Secondary | ICD-10-CM | POA: Diagnosis not present

## 2016-05-22 DIAGNOSIS — Z794 Long term (current) use of insulin: Secondary | ICD-10-CM | POA: Diagnosis not present

## 2016-05-22 DIAGNOSIS — E119 Type 2 diabetes mellitus without complications: Secondary | ICD-10-CM | POA: Diagnosis not present

## 2016-05-22 DIAGNOSIS — E1165 Type 2 diabetes mellitus with hyperglycemia: Secondary | ICD-10-CM | POA: Diagnosis not present

## 2016-05-22 DIAGNOSIS — Z87440 Personal history of urinary (tract) infections: Secondary | ICD-10-CM | POA: Diagnosis not present

## 2016-08-30 DIAGNOSIS — I10 Essential (primary) hypertension: Secondary | ICD-10-CM | POA: Diagnosis not present

## 2016-08-30 DIAGNOSIS — E119 Type 2 diabetes mellitus without complications: Secondary | ICD-10-CM | POA: Diagnosis not present

## 2016-08-30 DIAGNOSIS — E669 Obesity, unspecified: Secondary | ICD-10-CM | POA: Diagnosis not present

## 2016-08-30 DIAGNOSIS — Z6832 Body mass index (BMI) 32.0-32.9, adult: Secondary | ICD-10-CM | POA: Diagnosis not present

## 2016-08-30 DIAGNOSIS — Z794 Long term (current) use of insulin: Secondary | ICD-10-CM | POA: Diagnosis not present

## 2016-09-14 DIAGNOSIS — H5213 Myopia, bilateral: Secondary | ICD-10-CM | POA: Diagnosis not present

## 2016-09-14 DIAGNOSIS — H04123 Dry eye syndrome of bilateral lacrimal glands: Secondary | ICD-10-CM | POA: Diagnosis not present

## 2016-09-14 DIAGNOSIS — E119 Type 2 diabetes mellitus without complications: Secondary | ICD-10-CM | POA: Diagnosis not present

## 2016-09-14 DIAGNOSIS — Z794 Long term (current) use of insulin: Secondary | ICD-10-CM | POA: Diagnosis not present

## 2016-09-14 DIAGNOSIS — H2513 Age-related nuclear cataract, bilateral: Secondary | ICD-10-CM | POA: Diagnosis not present

## 2016-09-14 DIAGNOSIS — H524 Presbyopia: Secondary | ICD-10-CM | POA: Diagnosis not present

## 2016-09-14 DIAGNOSIS — H40013 Open angle with borderline findings, low risk, bilateral: Secondary | ICD-10-CM | POA: Diagnosis not present

## 2016-09-19 DIAGNOSIS — Z1231 Encounter for screening mammogram for malignant neoplasm of breast: Secondary | ICD-10-CM | POA: Diagnosis not present

## 2016-11-20 ENCOUNTER — Other Ambulatory Visit: Payer: Self-pay | Admitting: Family Medicine

## 2016-11-20 DIAGNOSIS — I10 Essential (primary) hypertension: Secondary | ICD-10-CM | POA: Diagnosis not present

## 2016-11-20 DIAGNOSIS — Z0000 Encounter for general adult medical examination without abnormal findings: Secondary | ICD-10-CM | POA: Diagnosis not present

## 2016-11-20 DIAGNOSIS — E1165 Type 2 diabetes mellitus with hyperglycemia: Secondary | ICD-10-CM | POA: Diagnosis not present

## 2016-11-20 DIAGNOSIS — E041 Nontoxic single thyroid nodule: Secondary | ICD-10-CM

## 2016-11-20 DIAGNOSIS — J42 Unspecified chronic bronchitis: Secondary | ICD-10-CM | POA: Diagnosis not present

## 2016-11-20 DIAGNOSIS — N3281 Overactive bladder: Secondary | ICD-10-CM | POA: Diagnosis not present

## 2016-11-20 DIAGNOSIS — E7800 Pure hypercholesterolemia, unspecified: Secondary | ICD-10-CM | POA: Diagnosis not present

## 2016-11-20 DIAGNOSIS — M1990 Unspecified osteoarthritis, unspecified site: Secondary | ICD-10-CM | POA: Diagnosis not present

## 2016-11-20 DIAGNOSIS — F411 Generalized anxiety disorder: Secondary | ICD-10-CM | POA: Diagnosis not present

## 2016-12-05 DIAGNOSIS — Z794 Long term (current) use of insulin: Secondary | ICD-10-CM | POA: Diagnosis not present

## 2016-12-05 DIAGNOSIS — E669 Obesity, unspecified: Secondary | ICD-10-CM | POA: Diagnosis not present

## 2016-12-05 DIAGNOSIS — I10 Essential (primary) hypertension: Secondary | ICD-10-CM | POA: Diagnosis not present

## 2016-12-05 DIAGNOSIS — R945 Abnormal results of liver function studies: Secondary | ICD-10-CM | POA: Diagnosis not present

## 2016-12-05 DIAGNOSIS — E119 Type 2 diabetes mellitus without complications: Secondary | ICD-10-CM | POA: Diagnosis not present

## 2016-12-11 ENCOUNTER — Ambulatory Visit
Admission: RE | Admit: 2016-12-11 | Discharge: 2016-12-11 | Disposition: A | Discharge status: Home / Self Care | Payer: Commercial Managed Care - HMO | Source: Ambulatory Visit | Attending: Family Medicine | Admitting: Family Medicine

## 2016-12-11 DIAGNOSIS — E041 Nontoxic single thyroid nodule: Secondary | ICD-10-CM | POA: Diagnosis not present

## 2016-12-19 DIAGNOSIS — E119 Type 2 diabetes mellitus without complications: Secondary | ICD-10-CM | POA: Diagnosis not present

## 2016-12-19 DIAGNOSIS — I10 Essential (primary) hypertension: Secondary | ICD-10-CM | POA: Diagnosis not present

## 2016-12-19 DIAGNOSIS — E669 Obesity, unspecified: Secondary | ICD-10-CM | POA: Diagnosis not present

## 2016-12-19 DIAGNOSIS — K219 Gastro-esophageal reflux disease without esophagitis: Secondary | ICD-10-CM | POA: Diagnosis not present

## 2016-12-19 DIAGNOSIS — Z794 Long term (current) use of insulin: Secondary | ICD-10-CM | POA: Diagnosis not present

## 2016-12-19 DIAGNOSIS — E210 Primary hyperparathyroidism: Secondary | ICD-10-CM | POA: Diagnosis not present

## 2016-12-25 DIAGNOSIS — H40013 Open angle with borderline findings, low risk, bilateral: Secondary | ICD-10-CM | POA: Diagnosis not present

## 2016-12-28 IMAGING — CR DG CHEST 2V
2 series · 2 of 2 positions shown · non-contrast
Comparison: 12/20/2013

CLINICAL DATA: Chest pain for 1 day with shortness of breath. Pain
across entire chest radiating into left arm.

EXAM:
CHEST  2 VIEW

[w chest pa]
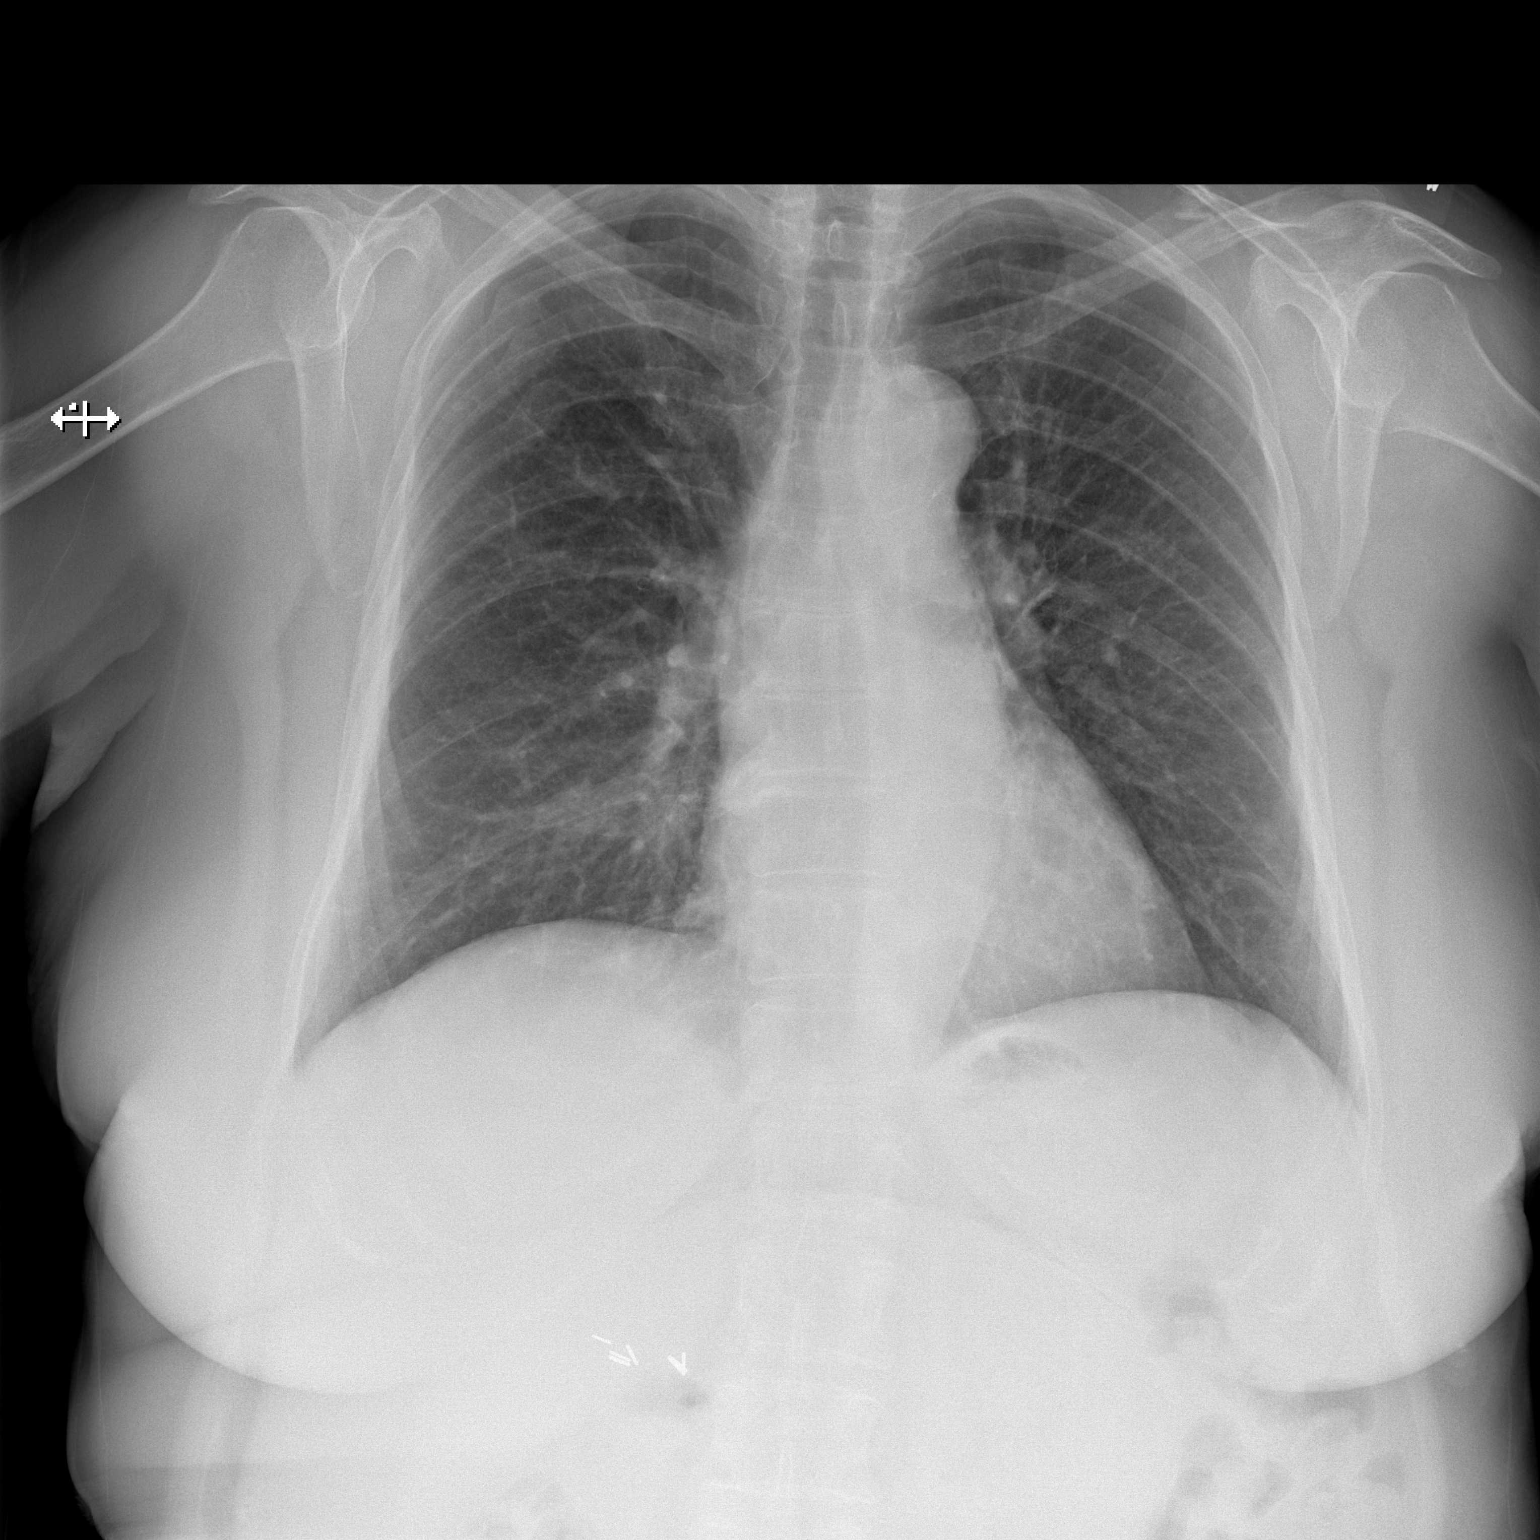

[w chest lat]
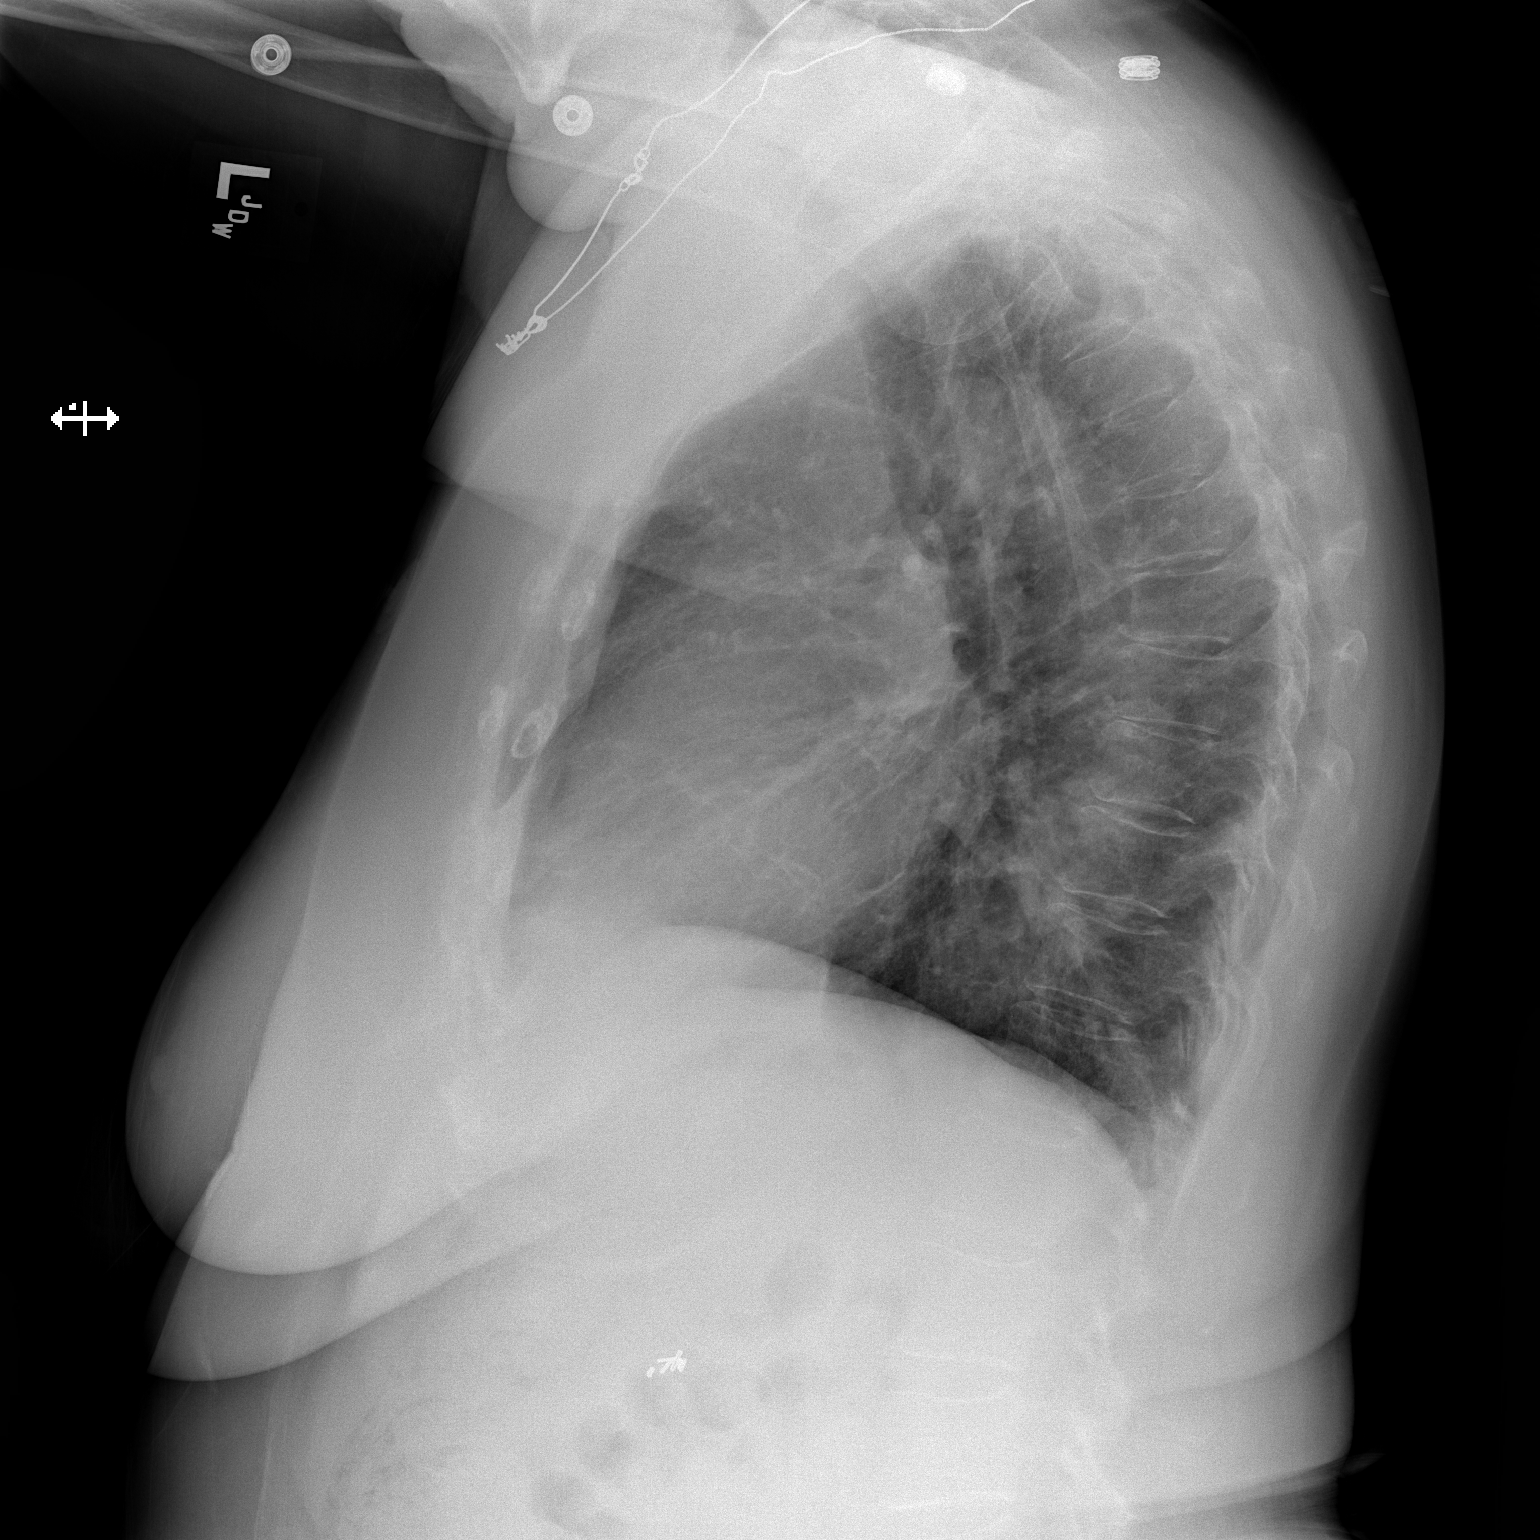

[2 of 2 positions shown; findings below may reference images not displayed]

FINDINGS: The lungs remain mildly hyperinflated with prominent interstitial
markings, similar to prior exam. The cardiomediastinal contours are
normal. Pulmonary vasculature is normal. No consolidation, pleural
effusion, or pneumothorax. There are old posterior right rib
fractures. No acute osseous abnormalities are seen.
IMPRESSION: No acute pulmonary process.

## 2017-01-05 ENCOUNTER — Telehealth (INDEPENDENT_AMBULATORY_CARE_PROVIDER_SITE_OTHER): Payer: Self-pay | Admitting: Orthopaedic Surgery

## 2017-01-05 NOTE — Telephone Encounter (Signed)
Patient called requesting RX refill on Celecoxib, generic for celebrex. She is about to run out and hasnt been seen for a while so if she needs to be seen she will be glad to schedule follow up. CB # [phone]

## 2017-01-08 ENCOUNTER — Other Ambulatory Visit (INDEPENDENT_AMBULATORY_CARE_PROVIDER_SITE_OTHER): Payer: Self-pay

## 2017-01-08 MED ORDER — CELECOXIB 200 MG PO CAPS: 200.0000 mg | ORAL_CAPSULE | Freq: Two times a day (BID) | ORAL | 3 refills | Status: DC

## 2017-01-08 NOTE — Telephone Encounter (Signed)
yes

## 2017-01-08 NOTE — Telephone Encounter (Signed)
IC advised submitted.  

## 2017-01-08 NOTE — Telephone Encounter (Signed)
Ok to rf? 

## 2017-01-19 DIAGNOSIS — M8588 Other specified disorders of bone density and structure, other site: Secondary | ICD-10-CM | POA: Diagnosis not present

## 2017-01-19 DIAGNOSIS — M810 Age-related osteoporosis without current pathological fracture: Secondary | ICD-10-CM | POA: Diagnosis not present

## 2017-03-06 DIAGNOSIS — J069 Acute upper respiratory infection, unspecified: Secondary | ICD-10-CM | POA: Diagnosis not present

## 2017-03-06 DIAGNOSIS — J42 Unspecified chronic bronchitis: Secondary | ICD-10-CM | POA: Diagnosis not present

## 2017-03-20 DIAGNOSIS — E669 Obesity, unspecified: Secondary | ICD-10-CM | POA: Diagnosis not present

## 2017-03-20 DIAGNOSIS — K219 Gastro-esophageal reflux disease without esophagitis: Secondary | ICD-10-CM | POA: Diagnosis not present

## 2017-03-20 DIAGNOSIS — Z794 Long term (current) use of insulin: Secondary | ICD-10-CM | POA: Diagnosis not present

## 2017-03-20 DIAGNOSIS — M810 Age-related osteoporosis without current pathological fracture: Secondary | ICD-10-CM | POA: Diagnosis not present

## 2017-03-20 DIAGNOSIS — E210 Primary hyperparathyroidism: Secondary | ICD-10-CM | POA: Diagnosis not present

## 2017-03-20 DIAGNOSIS — I10 Essential (primary) hypertension: Secondary | ICD-10-CM | POA: Diagnosis not present

## 2017-03-20 DIAGNOSIS — E119 Type 2 diabetes mellitus without complications: Secondary | ICD-10-CM | POA: Diagnosis not present

## 2017-04-05 ENCOUNTER — Encounter (HOSPITAL_COMMUNITY): Payer: Commercial Managed Care - HMO

## 2017-04-06 ENCOUNTER — Telehealth (INDEPENDENT_AMBULATORY_CARE_PROVIDER_SITE_OTHER): Payer: Self-pay | Admitting: Orthopedic Surgery

## 2017-04-06 NOTE — Telephone Encounter (Signed)
Jeani Hawking with [facility] is requesting Dr. Marlou Sa send new prescription of Celecoxib 200 mg be faxed to # [phone]

## 2017-04-09 NOTE — Telephone Encounter (Signed)
Faxed Rx

## 2017-04-09 NOTE — Telephone Encounter (Signed)
yes

## 2017-04-09 NOTE — Telephone Encounter (Signed)
Dr. Xu patient. 

## 2017-04-09 NOTE — Telephone Encounter (Signed)
See message below °

## 2017-04-12 ENCOUNTER — Ambulatory Visit (HOSPITAL_COMMUNITY)
Admission: RE | Admit: 2017-04-12 | Discharge: 2017-04-12 | Disposition: A | Discharge status: Home / Self Care | Payer: Medicare HMO | Source: Ambulatory Visit | Attending: Internal Medicine | Admitting: Internal Medicine

## 2017-04-12 ENCOUNTER — Encounter (HOSPITAL_COMMUNITY): Payer: Self-pay

## 2017-04-12 DIAGNOSIS — M810 Age-related osteoporosis without current pathological fracture: Secondary | ICD-10-CM | POA: Insufficient documentation

## 2017-04-12 MED ORDER — ZOLEDRONIC ACID 5 MG/100ML IV SOLN
5.0000 mg | Freq: Once | INTRAVENOUS | Status: AC
  Administered 2017-04-12: 5 mg via INTRAVENOUS
  Filled 2017-04-12: qty 100

## 2017-04-12 MED ORDER — SODIUM CHLORIDE 0.9 % IV SOLN
Freq: Once | INTRAVENOUS | Status: AC
  Administered 2017-04-12: 09:00:00 via INTRAVENOUS

## 2017-04-12 NOTE — Discharge Instructions (Signed)
Drink  Fluids/water as tolerated over the next 72 hours Tylenol or ibuprofen if needed for aches and pains  Continue Vit D as directed by your MD   Call 911 for emergencies  Call MD for any problems or questions.       Reclast Zoledronic Acid injection (Paget's Disease, Osteoporosis) What is this medicine? ZOLEDRONIC ACID (ZOE le dron ik AS id) lowers the amount of calcium loss from bone. It is used to treat Paget's disease and osteoporosis in women. This medicine may be used for other purposes; ask your health care provider or pharmacist if you have questions. COMMON BRAND NAME(S): Reclast, Zometa What should I tell my health care provider before I take this medicine? They need to know if you have any of these conditions: -aspirin-sensitive asthma -cancer, especially if you are receiving medicines used to treat cancer -dental disease or wear dentures -infection -kidney disease -low levels of calcium in the blood -past surgery on the parathyroid gland or intestines -receiving corticosteroids like dexamethasone or prednisone -an unusual or allergic reaction to zoledronic acid, other medicines, foods, dyes, or preservatives -pregnant or trying to get pregnant -breast-feeding How should I use this medicine? This medicine is for infusion into a vein. It is given by a health care professional in a hospital or clinic setting. Talk to your pediatrician regarding the use of this medicine in children. This medicine is not approved for use in children. Overdosage: If you think you have taken too much of this medicine contact a poison control center or emergency room at once. NOTE: This medicine is only for you. Do not share this medicine with others. What if I miss a dose? It is important not to miss your dose. Call your doctor or health care professional if you are unable to keep an appointment. What may interact with this medicine? -certain antibiotics given by  injection -NSAIDs, medicines for pain and inflammation, like ibuprofen or naproxen -some diuretics like bumetanide, furosemide -teriparatide This list may not describe all possible interactions. Give your health care provider a list of all the medicines, herbs, non-prescription drugs, or dietary supplements you use. Also tell them if you smoke, drink alcohol, or use illegal drugs. Some items may interact with your medicine. What should I watch for while using this medicine? Visit your doctor or health care professional for regular checkups. It may be some time before you see the benefit from this medicine. Do not stop taking your medicine unless your doctor tells you to. Your doctor may order blood tests or other tests to see how you are doing. Women should inform their doctor if they wish to become pregnant or think they might be pregnant. There is a potential for serious side effects to an unborn child. Talk to your health care professional or pharmacist for more information. You should make sure that you get enough calcium and vitamin D while you are taking this medicine. Discuss the foods you eat and the vitamins you take with your health care professional. Some people who take this medicine have severe bone, joint, and/or muscle pain. This medicine may also increase your risk for jaw problems or a broken thigh bone. Tell your doctor right away if you have severe pain in your jaw, bones, joints, or muscles. Tell your doctor if you have any pain that does not go away or that gets worse. Tell your dentist and dental surgeon that you are taking this medicine. You should not have major dental  surgery while on this medicine. See your dentist to have a dental exam and fix any dental problems before starting this medicine. Take good care of your teeth while on this medicine. Make sure you see your dentist for regular follow-up appointments. What side effects may I notice from receiving this medicine? Side  effects that you should report to your doctor or health care professional as soon as possible: -allergic reactions like skin rash, itching or hives, swelling of the face, lips, or tongue -anxiety, confusion, or depression -breathing problems -changes in vision -eye pain -feeling faint or lightheaded, falls -jaw pain, especially after dental work -mouth sores -muscle cramps, stiffness, or weakness -redness, blistering, peeling or loosening of the skin, including inside the mouth -trouble passing urine or change in the amount of urine Side effects that usually do not require medical attention (report to your doctor or health care professional if they continue or are bothersome): -bone, joint, or muscle pain -constipation -diarrhea -fever -hair loss -irritation at site where injected -loss of appetite -nausea, vomiting -stomach upset -trouble sleeping -trouble swallowing -weak or tired This list may not describe all possible side effects. Call your doctor for medical advice about side effects. You may report side effects to FDA at 1-800-FDA-1088. Where should I keep my medicine? This drug is given in a hospital or clinic and will not be stored at home. NOTE: This sheet is a summary. It may not cover all possible information. If you have questions about this medicine, talk to your doctor, pharmacist, or health care provider.  2018 Elsevier/Gold Standard (2013-06-21 14:19:57)

## 2017-05-28 ENCOUNTER — Encounter (INDEPENDENT_AMBULATORY_CARE_PROVIDER_SITE_OTHER): Payer: Self-pay | Admitting: Physician Assistant

## 2017-05-28 ENCOUNTER — Ambulatory Visit (INDEPENDENT_AMBULATORY_CARE_PROVIDER_SITE_OTHER): Payer: Medicare HMO | Admitting: Physician Assistant

## 2017-05-28 ENCOUNTER — Ambulatory Visit (INDEPENDENT_AMBULATORY_CARE_PROVIDER_SITE_OTHER): Payer: Medicare HMO

## 2017-05-28 DIAGNOSIS — M25511 Pain in right shoulder: Secondary | ICD-10-CM | POA: Diagnosis not present

## 2017-05-28 MED ORDER — ACETAMINOPHEN-CODEINE #3 300-30 MG PO TABS: 1.0000 | ORAL_TABLET | Freq: Three times a day (TID) | ORAL | 0 refills | Status: DC | PRN

## 2017-05-28 MED ORDER — BUPIVACAINE HCL 0.25 % IJ SOLN
2.0000 mL | INTRAMUSCULAR | Status: AC | PRN
  Administered 2017-05-28: 2 mL via INTRA_ARTICULAR

## 2017-05-28 MED ORDER — LIDOCAINE HCL 2 % IJ SOLN
2.0000 mL | INTRAMUSCULAR | Status: AC | PRN
  Administered 2017-05-28: 2 mL

## 2017-05-28 MED ORDER — METHYLPREDNISOLONE ACETATE 40 MG/ML IJ SUSP
40.0000 mg | INTRAMUSCULAR | Status: AC | PRN
  Administered 2017-05-28: 40 mg via INTRA_ARTICULAR

## 2017-05-28 NOTE — Progress Notes (Signed)
Office Visit Note   Patient: Valerie Wood           Date of Birth: [birth year]           MRN: [id number] Visit Date: 05/28/2017              Requested by: Mayra Neer, MD [address] [place], [place] [address] PCP: Mayra Neer, MD   Assessment & Plan: Visit Diagnoses:  1. Acute pain of right shoulder     Plan: Impression is right shoulder rotator cuff bursitis versus cervical spine radiculopathy.  Today, we will proceed with a diagnostic and hopefully therapeutic right shoulder subacromial cortisone injection.  She will pay special attention to the anesthetic.  She will follow-up with [place] in 3 weeks time for recheck.  She will call if concerns or questions in the meantime.  Follow-Up Instructions: Return in about 3 weeks (around 06/18/2017).   Orders:  Orders Placed This Encounter  Procedures  . Large Joint Inj: R subacromial bursa  . XR Shoulder Right   No orders of the defined types were placed in this encounter.     Procedures: Large Joint Inj: R subacromial bursa on 05/28/2017 3:26 PM Indications: pain Details: 22 G needle Medications: 2 mL lidocaine 2 %; 2 mL bupivacaine 0.25 %; 40 mg methylPREDNISolone acetate 40 MG/ML Outcome: tolerated well, no immediate complications Patient was prepped and draped in the usual sterile fashion.       Clinical Data: No additional findings.   Subjective: Chief Complaint  Patient presents with  . Right Shoulder - Pain    HPI patient is a pleasant 72 year old female presents to our clinic today with right shoulder pain.  This began approximately 4 days ago without any known injury or change in activity.  All of her pain is to the right shoulder and into the deltoid.  Pain appears to be worse with forward flexion, internal rotation as well as sleeping on the right side.  She describes this as a dull ache.  She has been on Celebrex as well as Voltaren gel with minimal relief of symptoms.  She does note  numbness tingling tingling and occasional burning to the right hand, but she states that this is from her neuropathy.  No previous injury, surgical intervention or cortisone injection to either shoulder.  No previous cervical spine pathology.  Review of Systems as detailed in HPI.  All others reviewed and are negative.   Objective: Vital Signs: There were no vitals taken for this visit.  Physical Exam well-developed well-nourished female no acute distress.  Alert and oriented x3.  Ortho Exam examination of the right shoulder reveals full active range of motion all planes, although this is painful past 160 degrees of forward flexion.  She can internally rotate to T12.  Minimally positive empty can.  Negative cross body abduction.  Cervical spine exam is unremarkable.  Specialty Comments:  No specialty comments available.  Imaging: Xr Shoulder Right  Result Date: 05/28/2017 X-rays show moderate AC arthropathy    PMFS History: Patient Active Problem List   Diagnosis Date Noted  . Acute pain of right shoulder 05/28/2017  . Chest pain at rest   . Hyperglycemia   . NSTEMI (non-ST elevated myocardial infarction) ([place])   . Diabetes mellitus without complication ([place]) 02/40/9735  . Essential hypertension 04/28/2014  . HLD (hyperlipidemia) 04/28/2014  . Chest pain 04/28/2014  . Pain in the chest   . Fall 12/22/2013  . Concussion 12/22/2013  .  Lumbar transverse process fracture ([place]) 12/22/2013  . Migraine 12/22/2013  . DM (diabetes mellitus) ([place]) 12/22/2013  . Fracture of multiple ribs of right side 12/19/2013  . GERD (gastroesophageal reflux disease) 08/03/2011  . Gastroparesis 08/03/2011  . Anxiety 08/03/2011  . Rectocele 05/12/2011  . 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 COLONIC POLYPS, ADENOMATOUS, HX OF 05/02/2007   Past Medical History:  Diagnosis Date  . Anxiety   . 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 Diabetes mellitus    pt didn't divulge DM during PAT interview  but Dr. Paulene Floor nurse states that pt is type 2 diabetic, controlled by diet.  . Duodenitis without mention of hemorrhage   . Esophagitis, unspecified   . Fibromyalgia   . Gastroparesis due to DM ([place])   . GERD (gastroesophageal reflux disease)   . Hx of colonic polyps   . Hyperlipidemia   . Hypertension   . Migraine   . Osteopenia   . Seasonal allergies   . Shortness of breath    quit smoking 2013  . Stricture and stenosis of esophagus   . Thyroid nodule   . Tobacco dependence   . Uterine cancer ([place]) 1989    Family History  Problem Relation Age of Onset  . Colon cancer Father 41  . Heart disease Mother        MI, CVA    Past Surgical History:  Procedure Laterality Date  . ABDOMINAL HYSTERECTOMY  12/1987   BSO  . APPENDECTOMY    . BLADDER SURGERY     Tack   . CHOLECYSTECTOMY    . COLONOSCOPY     multiple  . ESOPHAGOGASTRODUODENOSCOPY     multiple  . RECTOCELE REPAIR  05/11/2011   Procedure: POSTERIOR REPAIR (RECTOCELE);  Surgeon: Cheri Fowler, MD;  Location: [facility];  Service: Gynecology;  Laterality: N/A;  . TONSILLECTOMY    . TUBAL LIGATION     Social History   Occupational History  . Occupation: Retired    Comment: Secondary school teacher  . Smoking status: Former Smoker    Packs/day: 1.00    Types: Cigarettes    Last attempt to quit: 02/22/2011    Years since quitting: 6.2  . Smokeless tobacco: Never Used  Substance and Sexual Activity  . Alcohol use: No  . Drug use: No  . Sexual activity: Not on file

## 2017-05-31 ENCOUNTER — Encounter: Payer: Self-pay | Admitting: Internal Medicine

## 2017-06-07 DIAGNOSIS — E669 Obesity, unspecified: Secondary | ICD-10-CM | POA: Diagnosis not present

## 2017-06-07 DIAGNOSIS — Z6831 Body mass index (BMI) 31.0-31.9, adult: Secondary | ICD-10-CM | POA: Diagnosis not present

## 2017-06-07 DIAGNOSIS — E7800 Pure hypercholesterolemia, unspecified: Secondary | ICD-10-CM | POA: Diagnosis not present

## 2017-06-07 DIAGNOSIS — G629 Polyneuropathy, unspecified: Secondary | ICD-10-CM | POA: Diagnosis not present

## 2017-06-07 DIAGNOSIS — I10 Essential (primary) hypertension: Secondary | ICD-10-CM | POA: Diagnosis not present

## 2017-06-18 ENCOUNTER — Ambulatory Visit (INDEPENDENT_AMBULATORY_CARE_PROVIDER_SITE_OTHER): Payer: Medicare HMO

## 2017-06-18 ENCOUNTER — Encounter (INDEPENDENT_AMBULATORY_CARE_PROVIDER_SITE_OTHER): Payer: Self-pay | Admitting: Orthopaedic Surgery

## 2017-06-18 ENCOUNTER — Ambulatory Visit (INDEPENDENT_AMBULATORY_CARE_PROVIDER_SITE_OTHER): Payer: Medicare HMO | Admitting: Orthopaedic Surgery

## 2017-06-18 DIAGNOSIS — M542 Cervicalgia: Secondary | ICD-10-CM

## 2017-06-18 MED ORDER — HYDROCODONE-ACETAMINOPHEN 5-325 MG PO TABS: 1.0000 | ORAL_TABLET | Freq: Two times a day (BID) | ORAL | 0 refills | Status: DC | PRN

## 2017-06-18 MED ORDER — METHYLPREDNISOLONE 4 MG PO TBPK: ORAL_TABLET | ORAL | 0 refills | Status: DC

## 2017-06-18 NOTE — Progress Notes (Signed)
Office Visit Note   Patient: Valerie Wood           Date of Birth: 04/13/1945           MRN: [id number] Visit Date: 06/18/2017              Requested by: Mayra Neer, MD [address] [place], [place] [address] PCP: Mayra Neer, MD   Assessment & Plan: Visit Diagnoses:  1. Neck pain     Plan: Impression is cervical spine radiculopathy versus rotator cuff pathology.  Because the subacromial injection only helped the pain about 50%, I believe she is also having a cervical spine radiculopathy.  We will call in a steroid taper to hopefully relieve her symptoms.  She is a diabetic, and I have counseled her on the need to monitor her blood sugars over the next several days.  She will call in a few weeks if she is not any better and we will likely obtain an MRI of her cervical spine.  Call with concerns or questions in the meantime.  Follow-Up Instructions: Return if symptoms worsen or fail to improve.   Orders:  Orders Placed This Encounter  Procedures  . XR Cervical Spine 2 or 3 views   Meds ordered this encounter  Medications  . methylPREDNISolone (MEDROL DOSEPAK) 4 MG TBPK tablet    Sig: Take as directed    Dispense:  21 tablet    Refill:  0  . HYDROcodone-acetaminophen (NORCO) 5-325 MG tablet    Sig: Take 1 tablet by mouth 2 (two) times daily as needed for moderate pain.    Dispense:  10 tablet    Refill:  0      Procedures: No procedures performed   Clinical Data: No additional findings.   Subjective: Chief Complaint  Patient presents with  . Right Shoulder - Pain, Follow-up    HPI patient is a pleasant 72 year old female who presents to our clinic today with continued right shoulder pain.  We saw her on 05/28/2017.  At the time, she rated her pain at an 8 out of 10.  We injected her right shoulder subacromial space with cortisone and she notes the pain went down to a 4 out of 10 for the 1 to 2 days following the injection.  The pain has  returned.  She notes a constant ache down her entire right arm and into her hand.  She does note tingling and burning sensations to the forearm and hand, but does note she has a history of neuropathy.  She has been on gabapentin and Tylenol 3 with minimal relief of symptoms.  No previous cervical spine pathology that she is aware of.  Review of Systems as detailed in HPI.  All others reviewed and are negative.   Objective: Vital Signs: There were no vitals taken for this visit.  Physical Exam well-developed and well-nourished female in no acute distress.  Alert and oriented x3.  Ortho Exam examination of her right shoulder reveals full active range of motion without pain.  Negative empty can.  Full painless motion of the neck.  Specialty Comments:  No specialty comments available.  Imaging: Xr Cervical Spine 2 Or 3 Views  Result Date: 06/18/2017 Diffuse degenerative disc disease    PMFS History: Patient Active Problem List   Diagnosis Date Noted  . Acute pain of right shoulder 05/28/2017  . Chest pain at rest   . Hyperglycemia   . NSTEMI (non-ST elevated myocardial infarction) (  [place])   . Diabetes mellitus without complication ([place]) 23/55/7322  . Essential hypertension 04/28/2014  . HLD (hyperlipidemia) 04/28/2014  . Chest pain 04/28/2014  . Pain in the chest   . Fall 12/22/2013  . Concussion 12/22/2013  . Lumbar transverse process fracture ([place]) 12/22/2013  . Migraine 12/22/2013  . DM (diabetes mellitus) ([place]) 12/22/2013  . Fracture of multiple ribs of right side 12/19/2013  . GERD (gastroesophageal reflux disease) 08/03/2011  . Gastroparesis 08/03/2011  . Anxiety 08/03/2011  . Rectocele 05/12/2011  . 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 COLONIC POLYPS, ADENOMATOUS, HX OF 05/02/2007   Past Medical History:  Diagnosis Date  . Anxiety   . 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 Diabetes mellitus    pt didn't divulge DM during PAT interview but Dr. Paulene Floor nurse  states that pt is type 2 diabetic, controlled by diet.  . Duodenitis without mention of hemorrhage   . Esophagitis, unspecified   . Fibromyalgia   . Gastroparesis due to DM ([place])   . GERD (gastroesophageal reflux disease)   . Hx of colonic polyps   . Hyperlipidemia   . Hypertension   . Migraine   . Osteopenia   . Seasonal allergies   . Shortness of breath    quit smoking 2013  . Stricture and stenosis of esophagus   . Thyroid nodule   . Tobacco dependence   . Uterine cancer ([place]) 1989    Family History  Problem Relation Age of Onset  . Colon cancer Father 32  . Heart disease Mother        MI, CVA    Past Surgical History:  Procedure Laterality Date  . ABDOMINAL HYSTERECTOMY  12/1987   BSO  . APPENDECTOMY    . BLADDER SURGERY     Tack   . CHOLECYSTECTOMY    . COLONOSCOPY     multiple  . ESOPHAGOGASTRODUODENOSCOPY     multiple  . RECTOCELE REPAIR  05/11/2011   Procedure: POSTERIOR REPAIR (RECTOCELE);  Surgeon: Cheri Fowler, MD;  Location: [facility];  Service: Gynecology;  Laterality: N/A;  . TONSILLECTOMY    . TUBAL LIGATION     Social History   Occupational History  . Occupation: Retired    Comment: Secondary school teacher  . Smoking status: Former Smoker    Packs/day: 1.00    Types: Cigarettes    Last attempt to quit: 02/22/2011    Years since quitting: 6.3  . Smokeless tobacco: Never Used  Substance and Sexual Activity  . Alcohol use: No  . Drug use: No  . Sexual activity: Not on file

## 2017-06-20 DIAGNOSIS — I10 Essential (primary) hypertension: Secondary | ICD-10-CM | POA: Diagnosis not present

## 2017-06-20 DIAGNOSIS — Z794 Long term (current) use of insulin: Secondary | ICD-10-CM | POA: Diagnosis not present

## 2017-06-20 DIAGNOSIS — E210 Primary hyperparathyroidism: Secondary | ICD-10-CM | POA: Diagnosis not present

## 2017-06-20 DIAGNOSIS — K219 Gastro-esophageal reflux disease without esophagitis: Secondary | ICD-10-CM | POA: Diagnosis not present

## 2017-06-20 DIAGNOSIS — E119 Type 2 diabetes mellitus without complications: Secondary | ICD-10-CM | POA: Diagnosis not present

## 2017-06-20 DIAGNOSIS — E669 Obesity, unspecified: Secondary | ICD-10-CM | POA: Diagnosis not present

## 2017-06-20 DIAGNOSIS — M810 Age-related osteoporosis without current pathological fracture: Secondary | ICD-10-CM | POA: Diagnosis not present

## 2017-06-29 ENCOUNTER — Other Ambulatory Visit (INDEPENDENT_AMBULATORY_CARE_PROVIDER_SITE_OTHER): Payer: Self-pay | Admitting: Orthopaedic Surgery

## 2017-07-23 DIAGNOSIS — H524 Presbyopia: Secondary | ICD-10-CM | POA: Diagnosis not present

## 2017-07-23 DIAGNOSIS — H40013 Open angle with borderline findings, low risk, bilateral: Secondary | ICD-10-CM | POA: Diagnosis not present

## 2017-07-23 DIAGNOSIS — Z794 Long term (current) use of insulin: Secondary | ICD-10-CM | POA: Diagnosis not present

## 2017-07-23 DIAGNOSIS — H2513 Age-related nuclear cataract, bilateral: Secondary | ICD-10-CM | POA: Diagnosis not present

## 2017-07-23 DIAGNOSIS — H5213 Myopia, bilateral: Secondary | ICD-10-CM | POA: Diagnosis not present

## 2017-07-23 DIAGNOSIS — E119 Type 2 diabetes mellitus without complications: Secondary | ICD-10-CM | POA: Diagnosis not present

## 2017-09-10 ENCOUNTER — Encounter: Payer: Self-pay | Admitting: Internal Medicine

## 2017-09-20 DIAGNOSIS — E210 Primary hyperparathyroidism: Secondary | ICD-10-CM | POA: Diagnosis not present

## 2017-09-20 DIAGNOSIS — E119 Type 2 diabetes mellitus without complications: Secondary | ICD-10-CM | POA: Diagnosis not present

## 2017-09-20 DIAGNOSIS — E669 Obesity, unspecified: Secondary | ICD-10-CM | POA: Diagnosis not present

## 2017-09-20 DIAGNOSIS — Z794 Long term (current) use of insulin: Secondary | ICD-10-CM | POA: Diagnosis not present

## 2017-09-20 DIAGNOSIS — I10 Essential (primary) hypertension: Secondary | ICD-10-CM | POA: Diagnosis not present

## 2017-09-20 DIAGNOSIS — M810 Age-related osteoporosis without current pathological fracture: Secondary | ICD-10-CM | POA: Diagnosis not present

## 2017-09-20 DIAGNOSIS — K219 Gastro-esophageal reflux disease without esophagitis: Secondary | ICD-10-CM | POA: Diagnosis not present

## 2017-09-25 DIAGNOSIS — Z1231 Encounter for screening mammogram for malignant neoplasm of breast: Secondary | ICD-10-CM | POA: Diagnosis not present

## 2017-10-03 ENCOUNTER — Telehealth (INDEPENDENT_AMBULATORY_CARE_PROVIDER_SITE_OTHER): Payer: Self-pay | Admitting: Orthopaedic Surgery

## 2017-10-03 MED ORDER — CELECOXIB 200 MG PO CAPS: 200.0000 mg | ORAL_CAPSULE | Freq: Two times a day (BID) | ORAL | 2 refills | Status: AC

## 2017-10-03 NOTE — Telephone Encounter (Signed)
Valerie Wood at [facility] is requesting patients Generic celebrex 200 mg. Phone # [phone]

## 2017-10-03 NOTE — Telephone Encounter (Signed)
[employer] mail order requires 90 day supply, Mendel Ryder approved. Medication request sent to [facility] for signature.

## 2017-10-03 NOTE — Telephone Encounter (Signed)
Yes #60 pills.  6 refills

## 2017-10-03 NOTE — Telephone Encounter (Signed)
Please advise 

## 2017-10-26 ENCOUNTER — Other Ambulatory Visit: Payer: Self-pay

## 2017-10-26 ENCOUNTER — Encounter: Payer: Self-pay | Admitting: Internal Medicine

## 2017-10-26 ENCOUNTER — Ambulatory Visit (AMBULATORY_SURGERY_CENTER): Payer: Self-pay

## 2017-10-26 VITALS — Ht 63.0 in | Wt 177.4 lb

## 2017-10-26 DIAGNOSIS — Z800 Family history of malignant neoplasm of digestive organs: Secondary | ICD-10-CM

## 2017-10-26 DIAGNOSIS — Z86010 Personal history of colonic polyps: Secondary | ICD-10-CM

## 2017-10-26 NOTE — Progress Notes (Signed)
No egg or soy allergy known to patient  No issues with past sedation with any surgeries  or procedures, no intubation problems  No diet pills per patient No home 02 use per patient  No blood thinners per patient  Pt denies issues with constipation  No A fib or A flutter  EMMI video sent to pt's e mail , pt declined    

## 2017-11-09 ENCOUNTER — Ambulatory Visit (AMBULATORY_SURGERY_CENTER): Payer: Medicare HMO | Admitting: Internal Medicine

## 2017-11-09 ENCOUNTER — Encounter: Payer: Self-pay | Admitting: Internal Medicine

## 2017-11-09 VITALS — BP 147/80 | HR 71 | Temp 97.8°F | Resp 13 | Ht 63.0 in | Wt 177.0 lb

## 2017-11-09 DIAGNOSIS — Z86010 Personal history of colonic polyps: Secondary | ICD-10-CM | POA: Diagnosis not present

## 2017-11-09 DIAGNOSIS — D125 Benign neoplasm of sigmoid colon: Secondary | ICD-10-CM | POA: Diagnosis not present

## 2017-11-09 DIAGNOSIS — D128 Benign neoplasm of rectum: Secondary | ICD-10-CM

## 2017-11-09 DIAGNOSIS — D127 Benign neoplasm of rectosigmoid junction: Secondary | ICD-10-CM | POA: Diagnosis not present

## 2017-11-09 DIAGNOSIS — I252 Old myocardial infarction: Secondary | ICD-10-CM | POA: Diagnosis not present

## 2017-11-09 DIAGNOSIS — D123 Benign neoplasm of transverse colon: Secondary | ICD-10-CM

## 2017-11-09 DIAGNOSIS — E119 Type 2 diabetes mellitus without complications: Secondary | ICD-10-CM | POA: Diagnosis not present

## 2017-11-09 DIAGNOSIS — D129 Benign neoplasm of anus and anal canal: Secondary | ICD-10-CM

## 2017-11-09 DIAGNOSIS — K6389 Other specified diseases of intestine: Secondary | ICD-10-CM | POA: Diagnosis not present

## 2017-11-09 DIAGNOSIS — I2510 Atherosclerotic heart disease of native coronary artery without angina pectoris: Secondary | ICD-10-CM | POA: Diagnosis not present

## 2017-11-09 DIAGNOSIS — I10 Essential (primary) hypertension: Secondary | ICD-10-CM | POA: Diagnosis not present

## 2017-11-09 DIAGNOSIS — K621 Rectal polyp: Secondary | ICD-10-CM | POA: Diagnosis not present

## 2017-11-09 MED ORDER — SODIUM CHLORIDE 0.9 % IV SOLN: 500.0000 mL | Freq: Once | INTRAVENOUS | Status: DC

## 2017-11-09 NOTE — Progress Notes (Signed)
Called to room to assist during endoscopic procedure.  Patient ID and intended procedure confirmed with present staff. Received instructions for my participation in the procedure from the performing physician.  

## 2017-11-09 NOTE — Addendum Note (Signed)
Addended by: Randall Hiss B on: 11/09/2017 09:30 AM   Modules accepted: Orders

## 2017-11-09 NOTE — Progress Notes (Signed)
Report given to PACU, vss 

## 2017-11-09 NOTE — Op Note (Signed)
[facility] Patient Name: Valerie Wood Procedure Date: 11/09/2017 7:36 AM MRN: [id number] Endoscopist: Gatha Mayer , MD Age: 72 Referring MD:  Date of Birth: [birth year] Gender: Female Account #: [id number] Procedure:                Colonoscopy Indications:              Surveillance: Personal history of adenomatous                            polyps on last colonoscopy 5 years ago Medicines:                Propofol per Anesthesia, Monitored Anesthesia Care Procedure:                Pre-Anesthesia Assessment:                           - Prior to the procedure, a History and Physical                            was performed, and patient medications and                            allergies were reviewed. The patient's tolerance of                            previous anesthesia was also reviewed. The risks                            and benefits of the procedure and the sedation                            options and risks were discussed with the patient.                            All questions were answered, and informed consent                            was obtained. Prior Anticoagulants: The patient has                            taken no previous anticoagulant or antiplatelet                            agents. ASA Grade Assessment: III - A patient with                            severe systemic disease. After reviewing the risks                            and benefits, the patient was deemed in                            satisfactory condition to undergo the procedure.  After obtaining informed consent, the colonoscope                            was passed under direct vision. Throughout the                            procedure, the patient's blood pressure, pulse, and                            oxygen saturations were monitored continuously. The                            Colonoscope was introduced through the anus and                             advanced to the the cecum, identified by                            appendiceal orifice and ileocecal valve. The                            patient tolerated the procedure well. The                            colonoscopy was somewhat difficult due to                            significant looping. Successful completion of the                            procedure was aided by applying abdominal pressure.                            The quality of the bowel preparation was good. The                            ileocecal valve, appendiceal orifice, and rectum                            were photographed. Scope In: 8:14:15 AM Scope Out: 8:31:51 AM Scope Withdrawal Time: 0 hours 12 minutes 25 seconds  Total Procedure Duration: 0 hours 17 minutes 36 seconds  Findings:                 The perianal and digital rectal examinations were                            normal.                           Three sessile polyps were found in the rectum,                            sigmoid colon and transverse colon. The polyps were  diminutive in size. These polyps were removed with                            a cold snare. Resection was complete, but the polyp                            tissue was only partially retrieved. Verification                            of patient identification for the specimen was                            done. Estimated blood loss was minimal.                           Diverticula were found in the sigmoid colon.                           A single small angiodysplastic lesion without                            bleeding was found in the cecum.                           The exam was otherwise without abnormality on                            direct and retroflexion views. Complications:            No immediate complications. Estimated Blood Loss:     Estimated blood loss was minimal. Impression:               - Three diminutive polyps in the rectum, in the                             sigmoid colon and in the transverse colon, removed                            with a cold snare. Complete resection. Partial                            retrieval.                           - Diverticulosis in the sigmoid colon.                           - A single non-bleeding colonic angiodysplastic                            lesion.                           - The examination was otherwise normal on direct  and retroflexion views.                           - Personal history of colonic polyps. 2 diminutive                            adenomas 2014 Recommendation:           - Patient has a contact number available for                            emergencies. The signs and symptoms of potential                            delayed complications were discussed with the                            patient. Return to normal activities tomorrow.                            Written discharge instructions were provided to the                            patient.                           - Resume previous diet.                           - Continue present medications.                           - No repeat colonoscopy likely given age and                            #/type/size of polyps Gatha Mayer, MD 11/09/2017 8:39:59 AM This report has been signed electronically.

## 2017-11-09 NOTE — Patient Instructions (Addendum)
   I removed 3 tiny polyps - the tiniest was not picked up.  I am thinking you will not need another routine colonoscopy - will let you know my mail or My Chart.  I appreciate the opportunity to care for you. Gatha Mayer, MD, FACG YOU HAD AN ENDOSCOPIC PROCEDURE TODAY AT [facility]:   Refer to the procedure report that was given to you for any specific questions about what was found during the examination.  If the procedure report does not answer your questions, please call your gastroenterologist to clarify.  If you requested that your care partner not be given the details of your procedure findings, then the procedure report has been included in a sealed envelope for you to review at your convenience later.  YOU SHOULD EXPECT: Some feelings of bloating in the abdomen. Passage of more gas than usual.  Walking can help get rid of the air that was put into your GI tract during the procedure and reduce the bloating. If you had a lower endoscopy (such as a colonoscopy or flexible sigmoidoscopy) you may notice spotting of blood in your stool or on the toilet paper. If you underwent a bowel prep for your procedure, you may not have a normal bowel movement for a few days.  Please Note:  You might notice some irritation and congestion in your nose or some drainage.  This is from the oxygen used during your procedure.  There is no need for concern and it should clear up in a day or so.  SYMPTOMS TO REPORT IMMEDIATELY:   Following lower endoscopy (colonoscopy or flexible sigmoidoscopy):  Excessive amounts of blood in the stool  Significant tenderness or worsening of abdominal pains  Swelling of the abdomen that is new, acute  Fever of 100F or higher   For urgent or emergent issues, a gastroenterologist can be reached at any hour by calling [phone].   DIET:  We do recommend a small meal at first, but then you may proceed to your regular diet.  Drink plenty of fluids  but you should avoid alcoholic beverages for 24 hours.  ACTIVITY:  You should plan to take it easy for the rest of today and you should NOT DRIVE or use heavy machinery until tomorrow (because of the sedation medicines used during the test).    FOLLOW UP: Our staff will call the number listed on your records the next business day following your procedure to check on you and address any questions or concerns that you may have regarding the information given to you following your procedure. If we do not reach you, we will leave a message.  However, if you are feeling well and you are not experiencing any problems, there is no need to return our call.  We will assume that you have returned to your regular daily activities without incident.  If any biopsies were taken you will be contacted by phone or by letter within the next 1-3 weeks.  Please call us at [phone] if you have not heard about the biopsies in 3 weeks.    SIGNATURES/CONFIDENTIALITY: You and/or your care partner have signed paperwork which will be entered into your electronic medical record.  These signatures attest to the fact that that the information above on your After Visit Summary has been reviewed and is understood.  Full responsibility of the confidentiality of this discharge information lies with you and/or your care-partner.

## 2017-11-12 ENCOUNTER — Telehealth: Payer: Self-pay

## 2017-11-12 NOTE — Telephone Encounter (Signed)
  Follow up Call-  Call back number 11/09/2017  Post procedure Call Back phone  # [phone]  Permission to leave phone message Yes  Some recent data might be hidden     Patient questions:  Do you have a fever, pain , or abdominal swelling? No. Pain Score  0 *  Have you tolerated food without any problems? Yes.    Have you been able to return to your normal activities? Yes.    Do you have any questions about your discharge instructions: Diet   No. Medications  No. Follow up visit  No.  Do you have questions or concerns about your Care? No.  Actions: * If pain score is 4 or above: No action needed, pain <4.

## 2017-11-20 ENCOUNTER — Encounter: Payer: Self-pay | Admitting: Internal Medicine

## 2017-11-20 NOTE — Progress Notes (Signed)
1 ADENOMA 1 LYMPHOID POLYP  NO REPEAT COLONOSCOPY AGE / HX  MY CHART

## 2017-12-06 DIAGNOSIS — E7800 Pure hypercholesterolemia, unspecified: Secondary | ICD-10-CM | POA: Diagnosis not present

## 2017-12-06 DIAGNOSIS — F411 Generalized anxiety disorder: Secondary | ICD-10-CM | POA: Diagnosis not present

## 2017-12-06 DIAGNOSIS — E1169 Type 2 diabetes mellitus with other specified complication: Secondary | ICD-10-CM | POA: Diagnosis not present

## 2017-12-06 DIAGNOSIS — R300 Dysuria: Secondary | ICD-10-CM | POA: Diagnosis not present

## 2017-12-06 DIAGNOSIS — E669 Obesity, unspecified: Secondary | ICD-10-CM | POA: Diagnosis not present

## 2017-12-06 DIAGNOSIS — E559 Vitamin D deficiency, unspecified: Secondary | ICD-10-CM | POA: Diagnosis not present

## 2017-12-06 DIAGNOSIS — Z0000 Encounter for general adult medical examination without abnormal findings: Secondary | ICD-10-CM | POA: Diagnosis not present

## 2017-12-06 DIAGNOSIS — E041 Nontoxic single thyroid nodule: Secondary | ICD-10-CM | POA: Diagnosis not present

## 2017-12-06 DIAGNOSIS — I10 Essential (primary) hypertension: Secondary | ICD-10-CM | POA: Diagnosis not present

## 2017-12-27 ENCOUNTER — Other Ambulatory Visit (HOSPITAL_COMMUNITY): Payer: Self-pay | Admitting: Internal Medicine

## 2017-12-27 DIAGNOSIS — E119 Type 2 diabetes mellitus without complications: Secondary | ICD-10-CM | POA: Diagnosis not present

## 2017-12-27 DIAGNOSIS — E210 Primary hyperparathyroidism: Secondary | ICD-10-CM

## 2017-12-27 DIAGNOSIS — E669 Obesity, unspecified: Secondary | ICD-10-CM | POA: Diagnosis not present

## 2017-12-27 DIAGNOSIS — I10 Essential (primary) hypertension: Secondary | ICD-10-CM | POA: Diagnosis not present

## 2017-12-27 DIAGNOSIS — Z794 Long term (current) use of insulin: Secondary | ICD-10-CM | POA: Diagnosis not present

## 2017-12-27 DIAGNOSIS — M810 Age-related osteoporosis without current pathological fracture: Secondary | ICD-10-CM | POA: Diagnosis not present

## 2017-12-27 DIAGNOSIS — K219 Gastro-esophageal reflux disease without esophagitis: Secondary | ICD-10-CM | POA: Diagnosis not present

## 2017-12-31 DIAGNOSIS — E1169 Type 2 diabetes mellitus with other specified complication: Secondary | ICD-10-CM | POA: Diagnosis not present

## 2017-12-31 DIAGNOSIS — Z7951 Long term (current) use of inhaled steroids: Secondary | ICD-10-CM | POA: Diagnosis not present

## 2017-12-31 DIAGNOSIS — B370 Candidal stomatitis: Secondary | ICD-10-CM | POA: Diagnosis not present

## 2017-12-31 DIAGNOSIS — Z794 Long term (current) use of insulin: Secondary | ICD-10-CM | POA: Diagnosis not present

## 2018-01-07 ENCOUNTER — Encounter (HOSPITAL_COMMUNITY): Payer: Medicare HMO

## 2018-01-07 ENCOUNTER — Ambulatory Visit (HOSPITAL_COMMUNITY): Payer: Medicare HMO

## 2018-01-22 DIAGNOSIS — H40013 Open angle with borderline findings, low risk, bilateral: Secondary | ICD-10-CM | POA: Diagnosis not present

## 2018-01-22 DIAGNOSIS — H04123 Dry eye syndrome of bilateral lacrimal glands: Secondary | ICD-10-CM | POA: Diagnosis not present

## 2018-01-23 DIAGNOSIS — D170 Benign lipomatous neoplasm of skin and subcutaneous tissue of head, face and neck: Secondary | ICD-10-CM | POA: Diagnosis not present

## 2018-01-23 DIAGNOSIS — Z85828 Personal history of other malignant neoplasm of skin: Secondary | ICD-10-CM | POA: Diagnosis not present

## 2018-01-23 DIAGNOSIS — L720 Epidermal cyst: Secondary | ICD-10-CM | POA: Diagnosis not present

## 2018-02-11 ENCOUNTER — Encounter (HOSPITAL_COMMUNITY)
Admission: RE | Admit: 2018-02-11 | Discharge: 2018-02-11 | Disposition: A | Discharge status: Home / Self Care | Payer: Medicare HMO | Source: Ambulatory Visit | Attending: Internal Medicine | Admitting: Internal Medicine

## 2018-02-11 DIAGNOSIS — E210 Primary hyperparathyroidism: Secondary | ICD-10-CM | POA: Insufficient documentation

## 2018-02-11 DIAGNOSIS — Z0389 Encounter for observation for other suspected diseases and conditions ruled out: Secondary | ICD-10-CM | POA: Diagnosis not present

## 2018-02-11 DIAGNOSIS — D351 Benign neoplasm of parathyroid gland: Secondary | ICD-10-CM

## 2018-02-11 HISTORY — DX: Benign neoplasm of parathyroid gland: D35.1

## 2018-02-11 MED ORDER — TECHNETIUM TC 99M SESTAMIBI - CARDIOLITE
27.4000 | Freq: Once | INTRAVENOUS | Status: AC | PRN
  Administered 2018-02-11: 10:00:00 27.4 via INTRAVENOUS

## 2018-02-18 ENCOUNTER — Ambulatory Visit: Payer: Self-pay | Admitting: Surgery

## 2018-02-18 DIAGNOSIS — E210 Primary hyperparathyroidism: Secondary | ICD-10-CM | POA: Diagnosis not present

## 2018-02-20 DIAGNOSIS — E210 Primary hyperparathyroidism: Secondary | ICD-10-CM | POA: Diagnosis not present

## 2018-03-07 ENCOUNTER — Encounter (HOSPITAL_COMMUNITY): Payer: Self-pay

## 2018-03-07 NOTE — Patient Instructions (Signed)
Your procedure is scheduled on: Friday, Feb. 7, 2020   Surgery Time:  7:30AM-9:00AM   Report to [place]  Entrance    Report to admitting at 5:30 AM   Call this number if you have problems the morning of surgery [phone]   Do not eat food or drink liquids :After Midnight.   Brush your teeth the morning of surgery.   Do NOT smoke after Midnight   Take these medicines the morning of surgery with A SIP OF WATER: None   Use Asthma Inhaler per normal routine   Use eye drops per normal routine  DO NOT TAKE ANY DIABETIC MEDICATIONS DAY OF YOUR SURGERY                               You may not have any metal on your body including hair pins, jewelry, and body piercings             Do not wear make-up, lotions, powders, perfumes/cologne, or deodorant             Do not wear nail polish.  Do not shave  48 hours prior to surgery.                Do not bring valuables to the hospital. [place].   Contacts, dentures or bridgework may not be worn into surgery.    Patients discharged the day of surgery will not be allowed to drive home.   Special Instructions: Bring a copy of your healthcare power of attorney and living will documents         the day of surgery if you haven't scanned them in before.              Please read over the following fact sheets you were given:  [facility] - Preparing for Surgery Before surgery, you can play an important role.  Because skin is not sterile, your skin needs to be as free of germs as possible.  You can reduce the number of germs on your skin by washing with CHG (chlorahexidine gluconate) soap before surgery.  CHG is an antiseptic cleaner which kills germs and bonds with the skin to continue killing germs even after washing. Please DO NOT use if you have an allergy to CHG or antibacterial soaps.  If your skin becomes reddened/irritated stop using the CHG and inform your nurse  when you arrive at Short Stay. Do not shave (including legs and underarms) for at least 48 hours prior to the first CHG shower.  You may shave your face/neck.  Please follow these instructions carefully:  1.  Shower with CHG Soap the night before surgery and the  morning of surgery.  2.  If you choose to wash your hair, wash your hair first as usual with your normal  shampoo.  3.  After you shampoo, rinse your hair and body thoroughly to remove the shampoo.                             4.  Use CHG as you would any other liquid soap.  You can apply chg directly to the skin and wash.  Gently with a scrungie or clean washcloth.  5.  Apply the CHG Soap  to your body ONLY FROM THE NECK DOWN.   Do   not use on face/ open                           Wound or open sores. Avoid contact with eyes, ears mouth and   genitals (private parts).                       Wash face,  Genitals (private parts) with your normal soap.             6.  Wash thoroughly, paying special attention to the area where your    surgery  will be performed.  7.  Thoroughly rinse your body with warm water from the neck down.  8.  DO NOT shower/wash with your normal soap after using and rinsing off the CHG Soap.                9.  Pat yourself dry with a clean towel.            10.  Wear clean pajamas.            11.  Place clean sheets on your bed the night of your first shower and do not  sleep with pets. Day of Surgery : Do not apply any lotions/deodorants the morning of surgery.  Please wear clean clothes to the hospital/surgery center.  FAILURE TO FOLLOW THESE INSTRUCTIONS MAY RESULT IN THE CANCELLATION OF YOUR SURGERY  PATIENT SIGNATURE_________________________________  NURSE SIGNATURE__________________________________  ________________________________________________________________________

## 2018-03-07 NOTE — Pre-Procedure Instructions (Signed)
Left message with Debbie at Dr. Tera Helper office to inquire about surgical clearance for Ms. Hage.

## 2018-03-08 ENCOUNTER — Encounter (HOSPITAL_COMMUNITY)
Admission: RE | Admit: 2018-03-08 | Discharge: 2018-03-08 | Disposition: A | Discharge status: Home / Self Care | Payer: Medicare HMO | Source: Ambulatory Visit | Attending: Surgery | Admitting: Surgery

## 2018-03-08 ENCOUNTER — Telehealth: Payer: Self-pay | Admitting: *Deleted

## 2018-03-08 ENCOUNTER — Encounter (HOSPITAL_COMMUNITY): Payer: Self-pay

## 2018-03-08 ENCOUNTER — Other Ambulatory Visit: Payer: Self-pay

## 2018-03-08 DIAGNOSIS — Z01818 Encounter for other preprocedural examination: Secondary | ICD-10-CM | POA: Diagnosis not present

## 2018-03-08 HISTORY — DX: Unspecified abdominal hernia without obstruction or gangrene: K46.9

## 2018-03-08 HISTORY — DX: Other specified postprocedural states: Z98.890

## 2018-03-08 HISTORY — DX: Acute myocardial infarction, unspecified: I21.9

## 2018-03-08 HISTORY — DX: Fracture of one rib, right side, initial encounter for closed fracture: S22.31XA

## 2018-03-08 HISTORY — DX: Rectocele: N81.6

## 2018-03-08 HISTORY — DX: Dry eye syndrome of bilateral lacrimal glands: H04.123

## 2018-03-08 HISTORY — DX: Nausea with vomiting, unspecified: R11.2

## 2018-03-08 HISTORY — DX: Benign neoplasm of parathyroid gland: D35.1

## 2018-03-08 HISTORY — DX: Unspecified malignant neoplasm of skin, unspecified: C44.90

## 2018-03-08 HISTORY — DX: Diverticulosis of large intestine without perforation or abscess without bleeding: K57.30

## 2018-03-08 HISTORY — DX: Unspecified atherosclerosis: I70.90

## 2018-03-08 HISTORY — DX: Dizziness and giddiness: R42

## 2018-03-08 HISTORY — DX: Other intervertebral disc displacement, lumbar region: M51.26

## 2018-03-08 HISTORY — DX: Personal history of traumatic brain injury: Z87.820

## 2018-03-08 HISTORY — DX: Vitamin D deficiency, unspecified: E55.9

## 2018-03-08 LAB — BASIC METABOLIC PANEL
Anion gap: 7 (ref 5–15)
BUN: 21 mg/dL (ref 8–23)
CO2: 29 mmol/L (ref 22–32)
Calcium: 10.7 mg/dL — ABNORMAL HIGH (ref 8.9–10.3)
Chloride: 103 mmol/L (ref 98–111)
Creatinine, Ser: 0.89 mg/dL (ref 0.44–1.00)
GFR calc non Af Amer: >60 mL/min (ref >60)
Glucose, Bld: 133 mg/dL — ABNORMAL HIGH (ref 70–99)
Potassium: 4.4 mmol/L (ref 3.5–5.1)
SODIUM: 139 mmol/L (ref 135–145)

## 2018-03-08 LAB — HEMOGLOBIN A1C
Hgb A1c MFr Bld: 7.8 % — ABNORMAL HIGH (ref 4.8–5.6)
Mean Plasma Glucose: 177.16 mg/dL

## 2018-03-08 LAB — CBC
HCT: 45.5 % (ref 36.0–46.0)
Hemoglobin: 14.7 g/dL (ref 12.0–15.0)
MCH: 29.6 pg (ref 26.0–34.0)
MCHC: 32.3 g/dL (ref 30.0–36.0)
MCV: 91.7 fL (ref 80.0–100.0)
NRBC: 0 % (ref 0.0–0.2)
Platelets: 241 10*3/uL (ref 150–400)
RBC: 4.96 MIL/uL (ref 3.87–5.11)
RDW: 13.2 % (ref 11.5–15.5)
WBC: 6.6 10*3/uL (ref 4.0–10.5)

## 2018-03-08 LAB — GLUCOSE, CAPILLARY: Glucose-Capillary: 158 mg/dL — ABNORMAL HIGH (ref 70–99)

## 2018-03-08 NOTE — Patient Instructions (Signed)
Your procedure is scheduled on: Friday, Feb. 7, 2020   Surgery Time:  7:30AM-9:00AM   Report to [place]  Entrance    Report to admitting at 5:30 AM   Call this number if you have problems the morning of surgery [phone]   Do not eat food or drink liquids :After Midnight.   Brush your teeth the morning of surgery.   Do NOT smoke after Midnight   Take these medicines the morning of surgery with A SIP OF WATER: None   Use inhaler and eye drops per normal routine  DO NOT TAKE ANY DIABETIC MEDICATIONS DAY OF YOUR SURGERY                               You may not have any metal on your body including hair pins, jewelry, and body piercings             Do not wear make-up, lotions, powders, perfumes/cologne, or deodorant             Do not wear nail polish.  Do not shave  48 hours prior to surgery.                Do not bring valuables to the hospital. [place].   Contacts, dentures or bridgework may not be worn into surgery.    Patients discharged the day of surgery will not be allowed to drive home.   Special Instructions: Bring a copy of your healthcare power of attorney and living will documents         the day of surgery if you haven't scanned them in before.              Please read over the following fact sheets you were given:  [facility] - Preparing for Surgery Before surgery, you can play an important role.  Because skin is not sterile, your skin needs to be as free of germs as possible.  You can reduce the number of germs on your skin by washing with CHG (chlorahexidine gluconate) soap before surgery.  CHG is an antiseptic cleaner which kills germs and bonds with the skin to continue killing germs even after washing. Please DO NOT use if you have an allergy to CHG or antibacterial soaps.  If your skin becomes reddened/irritated stop using the CHG and inform your nurse when you arrive at Short  Stay. Do not shave (including legs and underarms) for at least 48 hours prior to the first CHG shower.  You may shave your face/neck.  Please follow these instructions carefully:  1.  Shower with CHG Soap the night before surgery and the  morning of surgery.  2.  If you choose to wash your hair, wash your hair first as usual with your normal  shampoo.  3.  After you shampoo, rinse your hair and body thoroughly to remove the shampoo.                             4.  Use CHG as you would any other liquid soap.  You can apply chg directly to the skin and wash.  Gently with a scrungie or clean washcloth.  5.  Apply the CHG Soap to your body ONLY FROM THE  NECK DOWN.   Do   not use on face/ open                           Wound or open sores. Avoid contact with eyes, ears mouth and   genitals (private parts).                       Wash face,  Genitals (private parts) with your normal soap.             6.  Wash thoroughly, paying special attention to the area where your    surgery  will be performed.  7.  Thoroughly rinse your body with warm water from the neck down.  8.  DO NOT shower/wash with your normal soap after using and rinsing off the CHG Soap.                9.  Pat yourself dry with a clean towel.            10.  Wear clean pajamas.            11.  Place clean sheets on your bed the night of your first shower and do not  sleep with pets. Day of Surgery : Do not apply any lotions/deodorants the morning of surgery.  Please wear clean clothes to the hospital/surgery center.  FAILURE TO FOLLOW THESE INSTRUCTIONS MAY RESULT IN THE CANCELLATION OF YOUR SURGERY  PATIENT SIGNATURE_________________________________  NURSE SIGNATURE__________________________________  ________________________________________________________________________

## 2018-03-08 NOTE — Telephone Encounter (Signed)
    I have reviewed chart. It looks like she is not a current patient as there are no prior records from our group. If she needs cardiology clearance, then she will need a new pt appt for formal assessment.    Please contact patient to see if she already has an established cardiologist elsewhere.  If so, she should follow-up with them for preoperative evaluation.  If not, please arrange for new patient appointment and please notify requesting MD of pending status.

## 2018-03-08 NOTE — Telephone Encounter (Signed)
   [facility] Pre-operative Risk Assessment    Request for surgical clearance:  1. What type of surgery is being performed? PARATHYROIDECTOMY    2. When is this surgery scheduled? 03/15/18   3. What type of clearance is required (medical clearance vs. Pharmacy clearance to hold med vs. Both)? MEDICAL  4. Are there any medications that need to be held prior to surgery and how long?ASA   5. Practice name and name of physician performing surgery? [facility]; DR. Sherren Mocha GERKIN   6. What is your office phone number [phone]    7.   What is your office fax number [phone]  8.   Anesthesia type (None, local, MAC, general) ? GENERAL   Valerie Wood 03/08/2018, 11:01 AM  _________________________________________________________________   (provider comments below)

## 2018-03-08 NOTE — Pre-Procedure Instructions (Signed)
HGB A1C results 03/08/2018 sent to Dr. Harlow Asa via epic.

## 2018-03-08 NOTE — Telephone Encounter (Signed)
Spoke pt who states that she doesn't have a primary cardiologist. I made pt aware that she will need a new pt cardiologist  appt for surgery clearance. I have scheduled pt for an appointment with Dr. Radford Pax on 2/4 @ 10:20 am. Pt has agreed to appt date and time. I have sent a fax request over to pt's PCP Dr, Brigitte Pulse at [facility]. Pt verbalized understanding and thanked me for the call.

## 2018-03-09 ENCOUNTER — Encounter (HOSPITAL_COMMUNITY): Payer: Self-pay | Admitting: Surgery

## 2018-03-09 DIAGNOSIS — E210 Primary hyperparathyroidism: Secondary | ICD-10-CM | POA: Diagnosis present

## 2018-03-09 NOTE — H&P (Signed)
General Surgery Valerie Wood [facility]  Valerie Wood DOB: March 24, 1945 Married / Language: English / Race: White Female   History of Present Illness  The patient is a 73 year old female who presents with primary hyperparathyroidism.  CHIEF COMPLAINT: primary hyperparathyroidism  Patient is referred by Dr. Dagmar Hait for surgical evaluation and management of primary hyperparathyroidism. Patient's primary care physician is Dr. Mayra Neer. Patient was noted on routine laboratory studies to have a persistently elevated serum calcium level. Most recent level was elevated at 11.3. Intact PTH level was in the mid range at 37. Vitamin D levels were normal. Patient does have osteoporosis and has been taking Reclast injections. She has not had any recent fractures. She denies nephrolithiasis. She does note frequent urination. She does note fatigue. Patient has not had a 24-hour urine collection for calcium. She has been followed with ultrasound examinations of the neck due to a thyroid nodule which is been stable for greater than 5 years. There is a family history of goiter in the patient's grandmother. There is no family history of thyroid malignancy. There is no family history of parathyroid disease. Patient has had no prior surgery on the head or neck. Patient presents today for further evaluation and recommendations for management of primary hyperparathyroidism. Patient did undergo a nuclear medicine parathyroid scan on February 11, 2018. This demonstrates a right inferior parathyroid adenoma.   Past Surgical History  Appendectomy  Colon Polyp Removal - Colonoscopy   Diagnostic Studies History  Colonoscopy  within last year Mammogram  within last year  Allergies Morphine Derivatives  Edema. metFORMIN HCl *ANTIDIABETICS*  Edema, Diarrhea. Doxepin HCl *ANTIDEPRESSANTS*  Diarrhea, Difficulty breathing. glipiZIDE *ANTIDIABETICS*  Dermatitis, Diarrhea. Fanapt  *ANTIPSYCHOTICS/ANTIMANIC AGENTS*  Dermatitis, Difficulty breathing. Cipro HC *OTIC AGENTS*  Diarrhea, Difficulty breathing. Invokamet *ANTIDIABETICS*  Dermatitis, Diarrhea. Januvia *ANTIDIABETICS*  Dermatitis, Diarrhea. Victoza *ANTIDIABETICS*  Diarrhea, Difficulty breathing. traMADol HCl *ANALGESICS - OPIOID*  Difficulty breathing, Diarrhea. Macrilen *DIAGNOSTIC PRODUCTS*  Diarrhea. Allergies Reconciled   Medication History  Lipitor (40MG  Tablet, Oral) Active. Losartan Potassium (50MG  Tablet, Oral) Active. Biotin (5MG  Tablet, Oral) Active. Symbicort (160-4.5MCG/ACT Aerosol, Inhalation) Active. Celecoxib (200MG  Capsule, Oral) Active. Omeprazole (20MG  Tab DR Disint, Oral) Active. Gabapentin (100MG  Capsule, Oral) Active. Reclast (5MG /100ML Solution, Intravenous) Active. Restasis (0.05% Emulsion, Ophthalmic) Active. Aspirin (81MG  Tablet, Oral) Active. ZyrTEC (10MG  Tablet, Oral) Active. Benadryl (25MG  Tablet, Oral) Active. Accu-Chek [employer]. Vitamin D3 (1000UNIT Capsule, Oral) Active. Centrum (Oral) Active. NovoLIN N ReliOn (100UNIT/ML Suspension, Subcutaneous) Active. Mycelex (10MG  Troche, Mouth/Throat) Active. Flonase Allergy Relief (50MCG/ACT Suspension, Nasal) Active. Medications Reconciled  Social History No alcohol use  No drug use  Tobacco use  Former smoker.  Family History Arthritis  Father, Mother. Colon Polyps  Sister. Diabetes Mellitus  Daughter. Heart Disease  Mother. Hypertension  Mother.  Other Problems Arthritis  Back Pain  Bladder Problems  Cholelithiasis  Chronic Obstructive Lung Disease  Diabetes Mellitus  Gastroesophageal Reflux Disease  High blood pressure  Hypercholesterolemia  Lump In Breast  Migraine Headache   Review of Systems  General Not Present- Appetite Loss, Chills, Fatigue, Fever, Night Sweats, Weight Gain and Weight Loss. HEENT Present- Seasonal Allergies and Wears  glasses/contact lenses. Not Present- Earache, Hearing Loss, Hoarseness, Nose Bleed, Oral Ulcers, Ringing in the Ears, Sinus Pain, Sore Throat, Visual Disturbances and Yellow Eyes. Respiratory Present- Snoring. Not Present- Bloody sputum, Chronic Cough, Difficulty Breathing and Wheezing. Breast Not Present- Breast Mass, Breast Pain, Nipple Discharge and Skin Changes. Cardiovascular Present- Shortness of  Breath. Not Present- Chest Pain, Difficulty Breathing Lying Down, Leg Cramps, Palpitations, Rapid Heart Rate and Swelling of Extremities. Musculoskeletal Present- Back Pain. Not Present- Joint Pain, Joint Stiffness, Muscle Pain, Muscle Weakness and Swelling of Extremities.   Physical Exam  See vital signs recorded above  GENERAL APPEARANCE Development: normal Nutritional status: normal Gross deformities: none  SKIN Rash, lesions, ulcers: none Induration, erythema: none Nodules: none palpable  EYES Conjunctiva and lids: normal Pupils: equal and reactive Iris: normal bilaterally  EARS, NOSE, MOUTH, THROAT External ears: no lesion or deformity External nose: no lesion or deformity Hearing: grossly normal Lips: no lesion or deformity Dentition: normal for age Oral mucosa: moist  NECK Symmetric: yes Trachea: midline Thyroid: no palpable nodules in the thyroid bed  CHEST Respiratory effort: normal Retraction or accessory muscle use: no Breath sounds: normal bilaterally Rales, rhonchi, wheeze: none  CARDIOVASCULAR Auscultation: regular rhythm, normal rate Murmurs: none Pulses: carotid and radial pulse 2+ palpable Lower extremity edema: none Lower extremity varicosities: none  MUSCULOSKELETAL Station and gait: normal Digits and nails: no clubbing or cyanosis Muscle strength: grossly normal all extremities Range of motion: grossly normal all extremities Deformity: none  LYMPHATIC Cervical: none palpable Supraclavicular: none palpable  PSYCHIATRIC Oriented to  person, place, and time: yes Mood and affect: normal for situation Judgment and insight: appropriate for situation    Assessment & Plan  PRIMARY HYPERPARATHYROIDISM (E21.0)  Pt Education - Pamphlet Given - The Parathyroid Surgery Book: discussed with patient and provided information.  Patient presents today on referral from her endocrinologist for evaluation of primary hyperparathyroidism. She is provided with written literature on parathyroid surgery to review at home.  Patient has biochemical evidence of primary hyperparathyroidism. Calcium level was elevated and PTH level is not suppressed. Nuclear medicine parathyroid scan localizes a right inferior parathyroid adenoma. Ultrasound examinations of the neck demonstrated a stable thyroid nodule but no other abnormality. Patient will undergo a 24-hour urine collection for calcium in the near future.  I have recommended proceeding with minimally invasive parathyroidectomy. I explained to the patient that there is approximately a 95% chance of single gland disease. We discussed risk and benefits of the procedure including the potential for recurrent laryngeal nerve injury. We discussed the possibility of further surgery if another adenoma developed. We discussed doing this as an outpatient surgical procedure. Patient understands and wishes to proceed in the near future.  The risks and benefits of the procedure have been discussed at length with the patient. The patient understands the proposed procedure, potential alternative treatments, and the course of recovery to be expected. All of the patient's questions have been answered at this time. The patient wishes to proceed with surgery.   Armandina Gemma, [facility] Office: [phone]

## 2018-03-11 ENCOUNTER — Encounter: Payer: Self-pay | Admitting: Cardiology

## 2018-03-11 NOTE — Progress Notes (Signed)
Cardiology Consult  Note:    Date:  03/12/2018   ID:  Valerie Wood, DOB February 27, 1945, MRN [id number]  PCP:  Valerie Neer, MD  Cardiologist:  No primary care provider on file.    Referring MD: Valerie Neer, MD   Chief Complaint  Patient presents with  . New Patient (Initial Visit)    preoperative cardiac clearance    History of Present Illness:    Valerie Wood is a 73 y.o. female  with a hx of HTN, GERD with esophagitis and esophageal stricture, HLD, DM, anxiety, migraine headaches, fibromyalgia, tobacco abuse.  Sse was admitted in 2016 with CP and nuclear stress test showed no ischemia but troponin increased after the stress test and she underwent cath showing mild nonobstructive ASCAD in the LAD and D1.   It was felt her CP was related to significant stress.    She is now referred by Dr. Buddy Wood in Endocrinology for preoperative cardiac clearance for removal of right inferior parathyroid adenoma in the setting of hyperparathyroidism.  She is here today for followup and is doing well.  She has some mild DOE but says it has not changed in many years.  She denies any chest pain or pressure,  PND, orthopnea, LE edema, dizziness, palpitations or syncope. She is compliant with her meds and is tolerating meds with no SE.    Past Medical History:  Diagnosis Date  . Anxiety   . Arthritis    hands, all over, back  . Atherosclerosis 2017  . 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 Bilateral dry eyes   . Cataract    Bilateral  . COPD (chronic obstructive pulmonary disease) ([place])   . Diabetes mellitus    pt didn't divulge DM during PAT interview but Valerie Wood Wood states that pt is type 2 diabetic, controlled by diet.  . Diverticulosis, sigmoid   . Duodenitis without mention of hemorrhage   . Enterocele    History of  . Esophagitis, unspecified   . Fibromyalgia   . Gastroparesis due to DM ([place])   . GERD (gastroesophageal reflux disease)   . Glaucoma   . History of  concussion   . Hx of colonic polyps   . Hyperlipidemia   . Hypertension   . L4-L5 disc bulge 03/2003  . Migraine   . Myocardial infarction [facility])    NSTEMI, patient states no one told her of this  . Osteopenia   . 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 PONV (postoperative nausea and vomiting)   . Rectocele    History of  . Right rib fracture 12/20/2013  . Seasonal allergies   . Shortness of breath    quit smoking 2013  . Skin cancer    Nose  . Stricture and stenosis of esophagus   . Thyroid nodule    Bilateral  . Tobacco dependence   . Uterine cancer ([place]) 1989  . Vertigo   . Vitamin D deficiency     Past Surgical History:  Procedure Laterality Date  . APPENDECTOMY    . BLADDER SURGERY     Tack   . CHOLECYSTECTOMY    . COLONOSCOPY  last one 11/09/2017   multiple  . DILATION AND EVACUATION    . ESOPHAGOGASTRODUODENOSCOPY  last one 12/06/2004   multiple  . LEEP    . reclast     2019  . RECTOCELE REPAIR  05/11/2011   Procedure: POSTERIOR REPAIR (RECTOCELE);  Surgeon: Valerie Fowler, MD;  Location: [facility];  Service: Gynecology;  Laterality: N/A;  . TONSILLECTOMY    . TUBAL LIGATION    . VAGINAL HYSTERECTOMY  12/1987   BSO    Current Medications: Current Meds  Medication Sig  . atorvastatin (LIPITOR) 40 MG tablet Take 40 mg by mouth at bedtime.   . Biotin 5 MG TABS Take 5 mg by mouth daily.   . budesonide-formoterol (SYMBICORT) 160-4.5 MCG/ACT inhaler Inhale 2 puffs into the lungs daily.   . celecoxib (CELEBREX) 200 MG capsule Take 1 capsule (200 mg total) by mouth 2 (two) times daily. (Patient taking differently: Take 200 mg by mouth daily. )  . cetirizine (ZYRTEC) 10 MG tablet Take 10 mg by mouth at bedtime.   . cholecalciferol (VITAMIN D) 1000 UNITS tablet Take 1,000 Units by mouth daily.  . cycloSPORINE (RESTASIS) 0.05 % ophthalmic emulsion Place 1 drop into both eyes 2 (two) times daily.  . fluticasone (FLONASE) 50 MCG/ACT nasal spray Place 2 sprays into both  nostrils daily as needed for allergies or rhinitis.  Marland Kitchen insulin NPH-regular Human (NOVOLIN 70/30) (70-30) 100 UNIT/ML injection Inject 50-60 Units into the skin 2 (two) times daily. Inject 50 units subcutaneously in the morning and 60 units at night  . losartan (COZAAR) 50 MG tablet Take 50 mg by mouth at bedtime.   . Multiple Vitamin (MULTIVITAMIN WITH MINERALS) TABS tablet Take 1 tablet by mouth daily.  Marland Kitchen omeprazole (PRILOSEC) 20 MG capsule Take 20 mg by mouth at bedtime.      Allergies:   Canagliflozin; Macrolides and ketolides; Morphine and related; Tetracyclines & related; Tramadol; Januvia [sitagliptin]; Metformin; Nitrofurantoin; Pantoprazole; Victoza [liraglutide]; and Ciprofloxacin hcl   Social History   Socioeconomic History  . Marital status: Widowed    Spouse name: Not on file  . Number of children: 2  . Years of education: Not on file  . Highest education level: Not on file  Occupational History  . 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 Financial resource strain: Not on file  . Food insecurity:    Worry: Not on file    Inability: Not on file  . Transportation needs:    Medical: Not on file    Non-medical: Not on file  Tobacco Use  . Smoking status: Former Smoker    Packs/day: 1.00    Years: 40.00    Pack years: 40.00    Types: Cigarettes    Last attempt to quit: 02/22/2011    Years since quitting: 7.0  . Smokeless tobacco: Never Used  Substance and Sexual Activity  . Alcohol use: No  . Drug use: No  . Sexual activity: Not on file  Lifestyle  . Physical activity:    Days per week: Not on file    Minutes per session: Not on file  . Stress: Not on file  Relationships  . Social connections:    Talks on phone: Not on file    Gets together: Not on file    Attends religious service: Not on file    Active member of club or organization: Not on file    Attends meetings of clubs or organizations: Not on file    Relationship status: Not on  file  Other Topics Concern  . Not on file  Social History Narrative   Caffeine daily      Family History: The patient's family history includes Colon cancer (age of onset: 67) in her father; Heart disease in her mother; Stomach cancer in her  father. There is no history of Rectal cancer or Esophageal cancer.  ROS:   Please see the history of present illness.    ROS  All other systems reviewed and negative.   EKGs/Labs/Other Studies Reviewed:    The following studies were reviewed today: Office notes from surgery, outpt EKG and prior cath in 2016  EKG:  EKG is not ordered today.  The ekg ordered preop demonstrates NSR with LVH and nonspecific T wave abnormality  Recent Labs: 03/08/2018: BUN 21; Creatinine, Ser 0.89; Hemoglobin 14.7; Platelets 241; Potassium 4.4; Sodium 139   Recent Lipid Panel    Component Value Date/Time   CHOL 123 04/29/2014 0220   TRIG 192 (H) 04/29/2014 0220   HDL 31 (L) 04/29/2014 0220   CHOLHDL 4.0 04/29/2014 0220   VLDL 38 04/29/2014 0220   LDLCALC 54 04/29/2014 0220    Physical Exam:    VS:  BP 132/78   Pulse 93   Ht 5\' 3"  (1.6 m)   Wt 174 lb 9.6 oz (79.2 kg)   SpO2 92%   BMI 30.93 kg/m     Wt Readings from Last 3 Encounters:  03/12/18 174 lb 9.6 oz (79.2 kg)  03/08/18 172 lb 4 oz (78.1 kg)  11/09/17 177 lb (80.3 kg)     GEN:  Well nourished, well developed in no acute distress HEENT: Normal NECK: No JVD; No carotid bruits LYMPHATICS: No lymphadenopathy CARDIAC: RRR, no murmurs, rubs, gallops RESPIRATORY:  Clear to auscultation without rales, wheezing or rhonchi  ABDOMEN: Soft, non-tender, non-distended MUSCULOSKELETAL:  No edema; No deformity  SKIN: Warm and dry NEUROLOGIC:  Alert and oriented x 3 PSYCHIATRIC:  Normal affect   ASSESSMENT:    1. Hyperparathyroidism, primary ([place])   2. Preoperative clearance   3. Coronary artery disease involving native coronary artery of native heart without angina pectoris   4. Essential  hypertension   5. Pure hypercholesterolemia    PLAN:    In order of problems listed above:  1.  Hyperparathyroidism with right inferior parathyroid adenoma - surgical resection planned for 2/7.  2.  Preoperative cardiac clearance - she denies any anginal sx and is able to exert  > 4 mets without any problems.  Her Revised cardiac index is 0.9% indicating a low risk of a major perioperative cardiac event.    3.  ASCAD - minimal disease of the LAD and D1 at time of cath 2016 and CP felt to be noncardiac in nature.  Continue ASA and statin.  OK to be off ASA for surgery.   4.  HTN - BP is controlled on exam today.  She will continue on Losartan 50mg  daily.  5.  Hyperlipidemia with LDL goal < 70.  Her LDL was 51 on 11/2017.  She will continue on Atorvastatin 40mg  daily.      Medication Adjustments/Labs and Tests Ordered: Current medicines are reviewed at length with the patient today.  Concerns regarding medicines are outlined above.  No orders of the defined types were placed in this encounter.  No orders of the defined types were placed in this encounter.   Signed, Fransico Him, MD  03/12/2018 10:48 AM    [place]

## 2018-03-12 ENCOUNTER — Encounter: Payer: Self-pay | Admitting: Cardiology

## 2018-03-12 ENCOUNTER — Ambulatory Visit: Payer: Medicare HMO | Admitting: Cardiology

## 2018-03-12 VITALS — BP 132/78 | HR 93 | Ht 63.0 in | Wt 174.6 lb

## 2018-03-12 DIAGNOSIS — E7800 Pure hypercholesterolemia, unspecified: Secondary | ICD-10-CM | POA: Diagnosis not present

## 2018-03-12 DIAGNOSIS — Z01818 Encounter for other preprocedural examination: Secondary | ICD-10-CM | POA: Insufficient documentation

## 2018-03-12 DIAGNOSIS — I10 Essential (primary) hypertension: Secondary | ICD-10-CM | POA: Diagnosis not present

## 2018-03-12 DIAGNOSIS — E210 Primary hyperparathyroidism: Secondary | ICD-10-CM | POA: Diagnosis not present

## 2018-03-12 DIAGNOSIS — I2510 Atherosclerotic heart disease of native coronary artery without angina pectoris: Secondary | ICD-10-CM

## 2018-03-12 NOTE — Anesthesia Preprocedure Evaluation (Addendum)
Anesthesia Evaluation  Patient identified by MRN, date of birth, ID band Patient awake    Reviewed: Allergy & Precautions, NPO status , Patient's Chart, lab work & pertinent test results  History of Anesthesia Complications (+) PONV  Airway Mallampati: III  TM Distance: <3 FB Neck ROM: Full    Dental  (+) Teeth Intact, Chipped,    Pulmonary COPD, former smoker,    breath sounds clear to auscultation       Cardiovascular hypertension, Pt. on medications + CAD and + Past MI   Rhythm:Regular Rate:Normal     Neuro/Psych  Headaches, Anxiety    GI/Hepatic Neg liver ROS, GERD  Medicated,  Endo/Other  diabetes, Type 2, Insulin Dependent  Renal/GU negative Renal ROS  negative genitourinary   Musculoskeletal  (+) Arthritis , Fibromyalgia -  Abdominal (+) + obese,   Peds  Hematology negative hematology ROS (+)   Anesthesia Other Findings   Reproductive/Obstetrics                           Lab Results  Component Value Date   WBC 6.6 03/08/2018   HGB 14.7 03/08/2018   HCT 45.5 03/08/2018   MCV 91.7 03/08/2018   PLT 241 03/08/2018   Lab Results  Component Value Date   CREATININE 0.89 03/08/2018   BUN 21 03/08/2018   NA 139 03/08/2018   K 4.4 03/08/2018   CL 103 03/08/2018   CO2 29 03/08/2018   Lab Results  Component Value Date   INR 0.96 04/29/2014   EKG: normal sinus rhythm.  Anesthesia Physical Anesthesia Plan  ASA: III  Anesthesia Plan: General   Post-op Pain Management:    Induction: Intravenous, Rapid sequence and Cricoid pressure planned  PONV Risk Score and Plan: 4 or greater and Ondansetron, Dexamethasone, Midazolam and Scopolamine patch - Pre-op  Airway Management Planned: Oral ETT  Additional Equipment: None  Intra-op Plan:   Post-operative Plan: Extubation in OR  Informed Consent: I have reviewed the patients History and Physical, chart, labs and discussed  the procedure including the risks, benefits and alternatives for the proposed anesthesia with the patient or authorized representative who has indicated his/her understanding and acceptance.     Dental advisory given  Plan Discussed with: CRNA  Anesthesia Plan Comments: (See PST note 03/08/2018, Konrad Felix, PA-C)     Anesthesia Quick Evaluation

## 2018-03-12 NOTE — Patient Instructions (Signed)
Medication Instructions:  Your physician recommends that you continue on your current medications as directed. Please refer to the Current Medication list given to you today.  If you need a refill on your cardiac medications before your next appointment, please call your pharmacy.   Lab work: None If you have labs (blood work) drawn today and your tests are completely normal, you will receive your results only by: Marland Kitchen MyChart Message (if you have MyChart) OR . A paper copy in the mail If you have any lab test that is abnormal or we need to change your treatment, we will call you to review the results.  Testing/Procedures: None  Follow-Up: As needed with Dr. Radford Pax

## 2018-03-12 NOTE — Progress Notes (Signed)
Anesthesia Chart Review   Case:  [id number] Date/Time:  03/15/18 0715   Procedure:  RIGHT INFERIOR PARATHYROIDECTOMY (Right )   Anesthesia type:  General   Pre-op diagnosis:  primary hyperparathyroidism   Location:  [place] [facility]   Surgeon:  Armandina Gemma, MD      DISCUSSION: 73 yo former smoker (40 pack years, quit 02/22/11) with h/o PONV, GERD, esophageal stricture, HTN, anxiety, fibromyalgia, DM II, HLD, COPD, primary hyperparathyroidism scheduled for above surgery on 03/15/18 with Dr. Armandina Gemma.   Pt was admitted in 2016 with CP and nuclear stress test showed no ischemia but troponin increased after the stress test and she underwent cath showing mild nonobstructive ASCAD in the LAD and D1.   It was felt her CP was related to significant stress.  Pt last seen by cardiology, Dr. Fransico Him, on 03/12/2018.  Per Dr. Theodosia Blender note, "she denies any anginal sx and is able to exercise > 4 mets without any problems.  Her Revised cardiac index is 0.9% indicating a low risk of a major perioperative cardiac event."  Pt can proceed with planned procedure barring acute status change.  VS: BP (!) 149/92   Pulse 83   Temp 36.8 C (Oral)   Resp 16   Ht 5\' 3"  (1.6 m)   Wt 78.1 kg   SpO2 94%   BMI 30.51 kg/m   PROVIDERS: Mayra Neer, MD is PCP   Fransico Him, MD is Cardiologist  LABS: Labs reviewed: Acceptable for surgery. (all labs ordered are listed, but only abnormal results are displayed)  Labs Reviewed  BASIC METABOLIC PANEL - Abnormal; Notable for the following components:      Result Value   Glucose, Bld 133 (*)    Calcium 10.7 (*)    All other components within normal limits  HEMOGLOBIN A1C - Abnormal; Notable for the following components:   Hgb A1c MFr Bld 7.8 (*)    All other components within normal limits  GLUCOSE, CAPILLARY - Abnormal; Notable for the following components:   Glucose-Capillary 158 (*)    All other components within normal limits  CBC      IMAGES: NM Parathyroid 02/11/2018 IMPRESSION: 1. Suspected parathyroid adenoma localizes to the area posterior to the lower pole of the right lobe of thyroid gland.  EKG: 03/08/2018 Rate 79 bpm Normal sinus rhythm Left ventricular hypertrophy Nonspecific T wave abnormality Abnormal ECG  No significant change since last tracing   CV:  Past Medical History:  Diagnosis Date  . Anxiety   . Arthritis    hands, all over, back  . Atherosclerosis 2017  . 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 Bilateral dry eyes   . Cataract    Bilateral  . COPD (chronic obstructive pulmonary disease) ([place])   . Diabetes mellitus    pt didn't divulge DM during PAT interview but Dr. Paulene Floor nurse states that pt is type 2 diabetic, controlled by diet.  . Diverticulosis, sigmoid   . Duodenitis without mention of hemorrhage   . Enterocele    History of  . Esophagitis, unspecified   . Fibromyalgia   . Gastroparesis due to DM ([place])   . GERD (gastroesophageal reflux disease)   . Glaucoma   . History of concussion   . Hx of colonic polyps   . Hyperlipidemia   . Hypertension   . L4-L5 disc bulge 03/2003  . Migraine   . Myocardial infarction [facility])    NSTEMI, patient states  no one told her of this  . Osteopenia   . 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 PONV (postoperative nausea and vomiting)   . Rectocele    History of  . Right rib fracture 12/20/2013  . Seasonal allergies   . Shortness of breath    quit smoking 2013  . Skin cancer    Nose  . Stricture and stenosis of esophagus   . Thyroid nodule    Bilateral  . Tobacco dependence   . Uterine cancer ([place]) 1989  . Vertigo   . Vitamin D deficiency     Past Surgical History:  Procedure Laterality Date  . APPENDECTOMY    . BLADDER SURGERY     Tack   . CHOLECYSTECTOMY    . COLONOSCOPY  last one 11/09/2017   multiple  . DILATION AND EVACUATION    . ESOPHAGOGASTRODUODENOSCOPY  last one 12/06/2004   multiple  .  LEEP    . reclast     2019  . RECTOCELE REPAIR  05/11/2011   Procedure: POSTERIOR REPAIR (RECTOCELE);  Surgeon: Cheri Fowler, MD;  Location: [facility];  Service: Gynecology;  Laterality: N/A;  . TONSILLECTOMY    . TUBAL LIGATION    . VAGINAL HYSTERECTOMY  12/1987   BSO    MEDICATIONS: . atorvastatin (LIPITOR) 40 MG tablet  . Biotin 5 MG TABS  . budesonide-formoterol (SYMBICORT) 160-4.5 MCG/ACT inhaler  . celecoxib (CELEBREX) 200 MG capsule  . cetirizine (ZYRTEC) 10 MG tablet  . cholecalciferol (VITAMIN D) 1000 UNITS tablet  . cycloSPORINE (RESTASIS) 0.05 % ophthalmic emulsion  . fluticasone (FLONASE) 50 MCG/ACT nasal spray  . insulin NPH-regular Human (NOVOLIN 70/30) (70-30) 100 UNIT/ML injection  . losartan (COZAAR) 50 MG tablet  . Multiple Vitamin (MULTIVITAMIN WITH MINERALS) TABS tablet  . omeprazole (PRILOSEC) 20 MG capsule   No current facility-administered medications for this encounter.      Maia Plan [facility] Pre-Surgical Testing [phone] 03/12/18 2:33 PM

## 2018-03-14 ENCOUNTER — Encounter (HOSPITAL_COMMUNITY): Payer: Self-pay | Admitting: Anesthesiology

## 2018-03-14 NOTE — Telephone Encounter (Signed)
Sent Surgical clearance to Dr. Gala Lewandowsky office at [phone].

## 2018-03-15 ENCOUNTER — Ambulatory Visit (HOSPITAL_COMMUNITY): Payer: Medicare HMO | Admitting: Physician Assistant

## 2018-03-15 ENCOUNTER — Encounter (HOSPITAL_COMMUNITY): Payer: Self-pay | Admitting: Emergency Medicine

## 2018-03-15 ENCOUNTER — Encounter (HOSPITAL_COMMUNITY)
Admission: RE | Disposition: A | Discharge status: Home / Self Care | Payer: Self-pay | Source: Home / Self Care | Attending: Surgery

## 2018-03-15 ENCOUNTER — Ambulatory Visit (HOSPITAL_COMMUNITY)
Admission: RE | Admit: 2018-03-15 | Discharge: 2018-03-15 | Disposition: A | Discharge status: Home / Self Care | Payer: Medicare HMO | Attending: Surgery | Admitting: Surgery

## 2018-03-15 ENCOUNTER — Ambulatory Visit (HOSPITAL_COMMUNITY): Payer: Medicare HMO | Admitting: Anesthesiology

## 2018-03-15 DIAGNOSIS — Z794 Long term (current) use of insulin: Secondary | ICD-10-CM | POA: Insufficient documentation

## 2018-03-15 DIAGNOSIS — M797 Fibromyalgia: Secondary | ICD-10-CM | POA: Insufficient documentation

## 2018-03-15 DIAGNOSIS — I10 Essential (primary) hypertension: Secondary | ICD-10-CM | POA: Diagnosis not present

## 2018-03-15 DIAGNOSIS — J449 Chronic obstructive pulmonary disease, unspecified: Secondary | ICD-10-CM | POA: Diagnosis not present

## 2018-03-15 DIAGNOSIS — Z7951 Long term (current) use of inhaled steroids: Secondary | ICD-10-CM | POA: Diagnosis not present

## 2018-03-15 DIAGNOSIS — Z881 Allergy status to other antibiotic agents status: Secondary | ICD-10-CM | POA: Insufficient documentation

## 2018-03-15 DIAGNOSIS — D351 Benign neoplasm of parathyroid gland: Secondary | ICD-10-CM | POA: Insufficient documentation

## 2018-03-15 DIAGNOSIS — K219 Gastro-esophageal reflux disease without esophagitis: Secondary | ICD-10-CM | POA: Insufficient documentation

## 2018-03-15 DIAGNOSIS — Z7983 Long term (current) use of bisphosphonates: Secondary | ICD-10-CM | POA: Diagnosis not present

## 2018-03-15 DIAGNOSIS — E7800 Pure hypercholesterolemia, unspecified: Secondary | ICD-10-CM | POA: Diagnosis not present

## 2018-03-15 DIAGNOSIS — E785 Hyperlipidemia, unspecified: Secondary | ICD-10-CM | POA: Diagnosis not present

## 2018-03-15 DIAGNOSIS — Z87891 Personal history of nicotine dependence: Secondary | ICD-10-CM | POA: Diagnosis not present

## 2018-03-15 DIAGNOSIS — Z885 Allergy status to narcotic agent status: Secondary | ICD-10-CM | POA: Insufficient documentation

## 2018-03-15 DIAGNOSIS — Z79899 Other long term (current) drug therapy: Secondary | ICD-10-CM | POA: Insufficient documentation

## 2018-03-15 DIAGNOSIS — E210 Primary hyperparathyroidism: Secondary | ICD-10-CM | POA: Insufficient documentation

## 2018-03-15 DIAGNOSIS — Z888 Allergy status to other drugs, medicaments and biological substances status: Secondary | ICD-10-CM | POA: Insufficient documentation

## 2018-03-15 DIAGNOSIS — E119 Type 2 diabetes mellitus without complications: Secondary | ICD-10-CM | POA: Diagnosis not present

## 2018-03-15 DIAGNOSIS — Z7982 Long term (current) use of aspirin: Secondary | ICD-10-CM | POA: Diagnosis not present

## 2018-03-15 DIAGNOSIS — I252 Old myocardial infarction: Secondary | ICD-10-CM | POA: Insufficient documentation

## 2018-03-15 DIAGNOSIS — I2510 Atherosclerotic heart disease of native coronary artery without angina pectoris: Secondary | ICD-10-CM | POA: Diagnosis not present

## 2018-03-15 HISTORY — PX: PARATHYROIDECTOMY: SHX19

## 2018-03-15 LAB — GLUCOSE, CAPILLARY
Glucose-Capillary: 96 mg/dL (ref 70–99)
Glucose-Capillary: 196 mg/dL — ABNORMAL HIGH (ref 70–99)

## 2018-03-15 SURGERY — PARATHYROIDECTOMY
Anesthesia: General | Laterality: Right

## 2018-03-15 MED ORDER — FENTANYL CITRATE (PF) 100 MCG/2ML IJ SOLN
INTRAMUSCULAR | Status: AC
  Filled 2018-03-15: qty 2

## 2018-03-15 MED ORDER — PROPOFOL 10 MG/ML IV BOLUS
INTRAVENOUS | Status: DC | PRN
  Administered 2018-03-15: 150 mg via INTRAVENOUS

## 2018-03-15 MED ORDER — LIDOCAINE HCL (CARDIAC) PF 100 MG/5ML IV SOSY
PREFILLED_SYRINGE | INTRAVENOUS | Status: DC | PRN
  Administered 2018-03-15 (×2): 100 mg via INTRAVENOUS

## 2018-03-15 MED ORDER — SUGAMMADEX SODIUM 500 MG/5ML IV SOLN
INTRAVENOUS | Status: DC | PRN
  Administered 2018-03-15: 160 mg via INTRAVENOUS

## 2018-03-15 MED ORDER — HYDROMORPHONE HCL 1 MG/ML IJ SOLN
INTRAMUSCULAR | Status: AC
  Filled 2018-03-15: qty 1

## 2018-03-15 MED ORDER — ACETAMINOPHEN 160 MG/5ML PO SOLN: 325.0000 mg | Freq: Once | ORAL | Status: DC

## 2018-03-15 MED ORDER — CEFAZOLIN SODIUM-DEXTROSE 2-4 GM/100ML-% IV SOLN
2.0000 g | INTRAVENOUS | Status: AC
  Administered 2018-03-15: 2 g via INTRAVENOUS
  Filled 2018-03-15: qty 100

## 2018-03-15 MED ORDER — SUGAMMADEX SODIUM 200 MG/2ML IV SOLN
INTRAVENOUS | Status: AC
  Filled 2018-03-15: qty 2

## 2018-03-15 MED ORDER — PROPOFOL 10 MG/ML IV BOLUS
INTRAVENOUS | Status: AC
  Filled 2018-03-15: qty 20

## 2018-03-15 MED ORDER — ACETAMINOPHEN 10 MG/ML IV SOLN
INTRAVENOUS | Status: AC
  Filled 2018-03-15: qty 100

## 2018-03-15 MED ORDER — SUCCINYLCHOLINE CHLORIDE 200 MG/10ML IV SOSY
PREFILLED_SYRINGE | INTRAVENOUS | Status: AC
  Filled 2018-03-15: qty 10

## 2018-03-15 MED ORDER — HYDROMORPHONE HCL 1 MG/ML IJ SOLN
0.2500 mg | INTRAMUSCULAR | Status: DC | PRN
  Administered 2018-03-15 (×2): 0.5 mg via INTRAVENOUS

## 2018-03-15 MED ORDER — FENTANYL CITRATE (PF) 100 MCG/2ML IJ SOLN
INTRAMUSCULAR | Status: DC | PRN
  Administered 2018-03-15 (×4): 50 ug via INTRAVENOUS

## 2018-03-15 MED ORDER — SUCCINYLCHOLINE CHLORIDE 20 MG/ML IJ SOLN
INTRAMUSCULAR | Status: DC | PRN
  Administered 2018-03-15: 100 mg via INTRAVENOUS

## 2018-03-15 MED ORDER — BUPIVACAINE HCL (PF) 0.25 % IJ SOLN
INTRAMUSCULAR | Status: AC
  Filled 2018-03-15: qty 30

## 2018-03-15 MED ORDER — 0.9 % SODIUM CHLORIDE (POUR BTL) OPTIME
TOPICAL | Status: DC | PRN
  Administered 2018-03-15: 1000 mL

## 2018-03-15 MED ORDER — ONDANSETRON HCL 4 MG/2ML IJ SOLN
INTRAMUSCULAR | Status: AC
  Filled 2018-03-15: qty 2

## 2018-03-15 MED ORDER — MEPERIDINE HCL 50 MG/ML IJ SOLN: 6.2500 mg | INTRAMUSCULAR | Status: DC | PRN

## 2018-03-15 MED ORDER — BUPIVACAINE HCL 0.25 % IJ SOLN
INTRAMUSCULAR | Status: DC | PRN
  Administered 2018-03-15: 10 mL

## 2018-03-15 MED ORDER — DEXAMETHASONE SODIUM PHOSPHATE 10 MG/ML IJ SOLN
INTRAMUSCULAR | Status: AC
  Filled 2018-03-15: qty 1

## 2018-03-15 MED ORDER — LIDOCAINE 2% (20 MG/ML) 5 ML SYRINGE
INTRAMUSCULAR | Status: AC
  Filled 2018-03-15: qty 5

## 2018-03-15 MED ORDER — PHENYLEPHRINE 40 MCG/ML (10ML) SYRINGE FOR IV PUSH (FOR BLOOD PRESSURE SUPPORT)
PREFILLED_SYRINGE | INTRAVENOUS | Status: AC
  Filled 2018-03-15: qty 10

## 2018-03-15 MED ORDER — CHLORHEXIDINE GLUCONATE CLOTH 2 % EX PADS: 6.0000 | MEDICATED_PAD | Freq: Once | CUTANEOUS | Status: DC

## 2018-03-15 MED ORDER — ROCURONIUM BROMIDE 100 MG/10ML IV SOLN
INTRAVENOUS | Status: DC | PRN
  Administered 2018-03-15: 50 mg via INTRAVENOUS

## 2018-03-15 MED ORDER — ROCURONIUM BROMIDE 100 MG/10ML IV SOLN
INTRAVENOUS | Status: AC
  Filled 2018-03-15: qty 1

## 2018-03-15 MED ORDER — PROMETHAZINE HCL 25 MG/ML IJ SOLN
6.2500 mg | INTRAMUSCULAR | Status: DC | PRN
  Administered 2018-03-15: 6.25 mg via INTRAVENOUS

## 2018-03-15 MED ORDER — PROMETHAZINE HCL 25 MG/ML IJ SOLN
INTRAMUSCULAR | Status: AC
  Filled 2018-03-15: qty 1

## 2018-03-15 MED ORDER — PHENYLEPHRINE HCL 10 MG/ML IJ SOLN
INTRAMUSCULAR | Status: DC | PRN
  Administered 2018-03-15 (×2): 40 ug via INTRAVENOUS
  Administered 2018-03-15: 80 ug via INTRAVENOUS
  Administered 2018-03-15 (×2): 40 ug via INTRAVENOUS
  Administered 2018-03-15: 80 ug via INTRAVENOUS
  Administered 2018-03-15: 40 ug via INTRAVENOUS

## 2018-03-15 MED ORDER — ALBUTEROL SULFATE HFA 108 (90 BASE) MCG/ACT IN AERS
INHALATION_SPRAY | RESPIRATORY_TRACT | Status: DC | PRN
  Administered 2018-03-15: 5 via RESPIRATORY_TRACT

## 2018-03-15 MED ORDER — ALBUTEROL SULFATE HFA 108 (90 BASE) MCG/ACT IN AERS
INHALATION_SPRAY | RESPIRATORY_TRACT | Status: AC
  Filled 2018-03-15: qty 6.7

## 2018-03-15 MED ORDER — ACETAMINOPHEN 10 MG/ML IV SOLN
1000.0000 mg | Freq: Once | INTRAVENOUS | Status: DC | PRN
  Administered 2018-03-15: 1000 mg via INTRAVENOUS

## 2018-03-15 MED ORDER — DEXAMETHASONE SODIUM PHOSPHATE 10 MG/ML IJ SOLN
INTRAMUSCULAR | Status: DC | PRN
  Administered 2018-03-15: 5 mg via INTRAVENOUS

## 2018-03-15 MED ORDER — EPHEDRINE 5 MG/ML INJ
INTRAVENOUS | Status: AC
  Filled 2018-03-15: qty 10

## 2018-03-15 MED ORDER — LACTATED RINGERS IV SOLN
INTRAVENOUS | Status: DC
  Administered 2018-03-15: 10:00:00 via INTRAVENOUS

## 2018-03-15 MED ORDER — HYDROCODONE-ACETAMINOPHEN 5-325 MG PO TABS: 1.0000 | ORAL_TABLET | ORAL | 0 refills | Status: DC | PRN

## 2018-03-15 MED ORDER — ONDANSETRON HCL 4 MG/2ML IJ SOLN
INTRAMUSCULAR | Status: DC | PRN
  Administered 2018-03-15: 4 mg via INTRAVENOUS

## 2018-03-15 MED ORDER — LACTATED RINGERS IV SOLN
INTRAVENOUS | Status: DC | PRN
  Administered 2018-03-15: 07:00:00 via INTRAVENOUS
  Administered 2018-03-15: 1000 mL via INTRAVENOUS

## 2018-03-15 MED ORDER — ACETAMINOPHEN 325 MG PO TABS: 325.0000 mg | ORAL_TABLET | Freq: Once | ORAL | Status: DC

## 2018-03-15 SURGICAL SUPPLY — 35 items
ADH SKN CLS APL DERMABOND .7 (GAUZE/BANDAGES/DRESSINGS) ×2
ATTRACTOMAT 16X20 MAGNETIC DRP (DRAPES) ×3 IMPLANT
BLADE SURG 15 STRL LF DISP TIS (BLADE) ×1 IMPLANT
BLADE SURG 15 STRL SS (BLADE) ×3
CHLORAPREP W/TINT 26ML (MISCELLANEOUS) ×3 IMPLANT
CLIP VESOCCLUDE MED 6/CT (CLIP) ×6 IMPLANT
CLIP VESOCCLUDE SM WIDE 6/CT (CLIP) ×10 IMPLANT
COVER SURGICAL LIGHT HANDLE (MISCELLANEOUS) ×3 IMPLANT
COVER WAND RF STERILE (DRAPES) ×3 IMPLANT
DERMABOND ADVANCED (GAUZE/BANDAGES/DRESSINGS) ×4
DERMABOND ADVANCED .7 DNX12 (GAUZE/BANDAGES/DRESSINGS) IMPLANT
DRAPE LAPAROTOMY T 98X78 PEDS (DRAPES) ×3 IMPLANT
ELECT PENCIL ROCKER SW 15FT (MISCELLANEOUS) ×3 IMPLANT
ELECT REM PT RETURN 15FT ADLT (MISCELLANEOUS) ×3 IMPLANT
GAUZE 4X4 16PLY RFD (DISPOSABLE) ×5 IMPLANT
GLOVE BIOGEL PI IND STRL 6.5 (GLOVE) IMPLANT
GLOVE BIOGEL PI IND STRL 7.5 (GLOVE) IMPLANT
GLOVE BIOGEL PI INDICATOR 6.5 (GLOVE) ×2
GLOVE BIOGEL PI INDICATOR 7.5 (GLOVE) ×4
GLOVE SURG ORTHO 8.0 STRL STRW (GLOVE) ×3 IMPLANT
GLOVE SURG SS PI 6.0 STRL IVOR (GLOVE) ×2 IMPLANT
GLOVE SURG SS PI 7.0 STRL IVOR (GLOVE) ×2 IMPLANT
GOWN STRL REUS W/TWL XL LVL3 (GOWN DISPOSABLE) ×9 IMPLANT
HEMOSTAT SURGICEL 2X4 FIBR (HEMOSTASIS) ×2 IMPLANT
ILLUMINATOR WAVEGUIDE N/F (MISCELLANEOUS) IMPLANT
KIT BASIN OR (CUSTOM PROCEDURE TRAY) ×3 IMPLANT
NDL HYPO 25X1 1.5 SAFETY (NEEDLE) ×1 IMPLANT
NEEDLE HYPO 25X1 1.5 SAFETY (NEEDLE) ×3 IMPLANT
PACK BASIC VI WITH GOWN DISP (CUSTOM PROCEDURE TRAY) ×3 IMPLANT
SUT MNCRL AB 4-0 PS2 18 (SUTURE) ×5 IMPLANT
SUT VIC AB 3-0 SH 18 (SUTURE) ×7 IMPLANT
SYR BULB IRRIGATION 50ML (SYRINGE) ×3 IMPLANT
SYR CONTROL 10ML LL (SYRINGE) ×3 IMPLANT
TOWEL OR 17X26 10 PK STRL BLUE (TOWEL DISPOSABLE) ×3 IMPLANT
TOWEL OR NON WOVEN STRL DISP B (DISPOSABLE) ×3 IMPLANT

## 2018-03-15 NOTE — Interval H&P Note (Signed)
History and Physical Interval Note:  03/15/2018 7:13 AM  Valerie Wood  has presented today for surgery, with the diagnosis of primary hyperparathyroidism  The various methods of treatment have been discussed with the patient and family. After consideration of risks, benefits and other options for treatment, the patient has consented to    Procedure(s): RIGHT INFERIOR PARATHYROIDECTOMY (Right) as a surgical intervention .    The patient's history has been reviewed, patient examined, no change in status, stable for surgery.  I have reviewed the patient's chart and labs.  Questions were answered to the patient's satisfaction.    Armandina Gemma, [place] [facility] Office: [place]

## 2018-03-15 NOTE — Anesthesia Postprocedure Evaluation (Signed)
Anesthesia Post Note  Patient: Valerie Wood  Procedure(s) Performed: RIGHT INFERIOR PARATHYROIDECTOMY (Right )     Patient location during evaluation: PACU Anesthesia Type: General Level of consciousness: awake and alert Pain management: pain level controlled Vital Signs Assessment: post-procedure vital signs reviewed and stable Respiratory status: spontaneous breathing, nonlabored ventilation, respiratory function stable and patient connected to nasal cannula oxygen Cardiovascular status: blood pressure returned to baseline and stable Postop Assessment: no apparent nausea or vomiting Anesthetic complications: no    Last Vitals:  Vitals:   03/15/18 1110 03/15/18 1139  BP: (!) 159/74 (!) 154/73  Pulse: 82 82  Resp: 14 18  Temp: 36.4 C   SpO2: 91% 91%    Last Pain:  Vitals:   03/15/18 1139  TempSrc:   PainSc: 2                  Effie Berkshire

## 2018-03-15 NOTE — Progress Notes (Signed)
PACU PHASE II RN NOTE:  Pt ready for DC to home, daughter at bedside, pts pain is minimal, pt able to speak normally, swallowing normally noted as well, tolerates PO fluids w/o difficulty. DC instructions reviewed with Daughter and patient, discussed the DC instructions from surgeon and those per anesthesia, stressed importance of safety while taking PO pain med. Discussed post op diet per MD and anesthesia, Ice pack care reviewed as well. Discussed activity per MD and anesthesia as well. Pt escorted to exit via wheelchair.

## 2018-03-15 NOTE — Op Note (Signed)
OPERATIVE REPORT - PARATHYROIDECTOMY  Preoperative diagnosis: Primary hyperparathyroidism  Postop diagnosis: Same  Procedure: Right inferior minimally invasive parathyroidectomy  Surgeon:  Armandina Gemma, MD  Anesthesia: General endotracheal  Estimated blood loss: Minimal  Preparation: ChloraPrep  Indications: Patient is referred by Dr. Dagmar Hait for surgical evaluation and management of primary hyperparathyroidism. Patient's primary care physician is Dr. Mayra Neer. Patient was noted on routine laboratory studies to have a persistently elevated serum calcium level. Most recent level was elevated at 11.3. Intact PTH level was in the mid range at 37. Vitamin D levels were normal. Patient does have osteoporosis and has been taking Reclast injections. She has not had any recent fractures. She denies nephrolithiasis. She does note frequent urination. She does note fatigue. Patient has not had a 24-hour urine collection for calcium. She has been followed with ultrasound examinations of the neck due to a thyroid nodule which is been stable for greater than 5 years.  Procedure: The patient was prepared in the pre-operative holding area. The patient was brought to the operating room and placed in a supine position on the operating room table. Following administration of general anesthesia, the patient was positioned and then prepped and draped in the usual strict aseptic fashion. After ascertaining that an adequate level of anesthesia been achieved, a neck incision was made with a #15 blade. Dissection was carried through subcutaneous tissues and platysma. Hemostasis was obtained with the electrocautery. Skin flaps were developed circumferentially and a Weitlander retractor was placed for exposure.  Strap muscles were incised in the midline. Strap muscles were reflected lateralley exposing the thyroid lobe. With gentle blunt dissection the thyroid lobe was mobilized.  Dissection was carried  through adipose tissue.  There is a rounded 1.5 cm mass posterior to the inferior pole of the thyroid.  This is dissected out and completely resected.  It is submitted to pathology for frozen section which demonstrated normal thyroid tissue.  Further exploration in the right neck allows for identification of the recurrent laryngeal nerve.  The entire lower right neck is dissected out including the area lateral to the esophagus, the carotid sheath, and the thyroid thymic tract.  At the upper aspect of the thyroid thymic tract adjacent to the inferior pole of the thyroid is a mildly enlarged parathyroid gland. It was gently mobilized. Vascular structures were divided between small ligaclips. The parathyroid gland was completely excised. It was submitted to pathology where frozen section confirmed parathyroid tissue consistent with adenoma.  Neck was irrigated with warm saline and good hemostasis was noted. Fibrillar was placed in the operative field. Strap muscles were approximated in the midline with interrupted 3-0 Vicryl sutures. Platysma was closed with interrupted 3-0 Vicryl sutures. Marcaine was infiltrated circumferentially. Skin was closed with a running 4-0 Monocryl subcuticular suture. Wound was washed and dried and Dermabond was applied. Patient was awakened from anesthesia and brought to the recovery room. The patient tolerated the procedure well.   Armandina Gemma, MD [facility] Office: [phone]

## 2018-03-15 NOTE — Anesthesia Procedure Notes (Signed)
Procedure Name: Intubation Date/Time: 03/15/2018 7:32 AM Performed by: [place]  C, CRNA Pre-anesthesia Checklist: Patient identified, Emergency Drugs available, Suction available and Patient being monitored Patient Re-evaluated:Patient Re-evaluated prior to induction Oxygen Delivery Method: Circle system utilized Preoxygenation: Pre-oxygenation with 100% oxygen Induction Type: IV induction, Rapid sequence and Cricoid Pressure applied Laryngoscope Size: Mac and 4 Grade View: Grade III Tube type: Oral Tube size: 7.0 mm Number of attempts: 1 Airway Equipment and Method: Stylet and Oral airway Placement Confirmation: ETT inserted through vocal cords under direct vision,  positive ETCO2 and breath sounds checked- equal and bilateral Secured at: 21 cm Tube secured with: Tape Dental Injury: Teeth and Oropharynx as per pre-operative assessment  Comments: DL x 1 by SRNA epiglottis only visualized. DL x 1 by CRNA grade 3 view 7.0 ETT placed =BBS +ETCO2

## 2018-03-15 NOTE — Transfer of Care (Signed)
Immediate Anesthesia Transfer of Care Note  Patient: Valerie Wood  Procedure(s) Performed: RIGHT INFERIOR PARATHYROIDECTOMY (Right )  Patient Location: PACU  Anesthesia Type:General  Level of Consciousness: awake, alert  and oriented  Airway & Oxygen Therapy: Patient Spontanous Breathing  Post-op Assessment: Report given to RN, Post -op Vital signs reviewed and stable and Patient moving all extremities  Post vital signs: Reviewed and stable  Last Vitals:  Vitals Value Taken Time  BP 160/84 03/15/2018  9:47 AM  Temp 36.5 C 03/15/2018  9:46 AM  Pulse 78 03/15/2018  9:54 AM  Resp 10 03/15/2018  9:54 AM  SpO2 97 % 03/15/2018  9:54 AM  Vitals shown include unvalidated device data.  Last Pain:  Vitals:   03/15/18 0602  TempSrc:   PainSc: 0-No pain      Patients Stated Pain Goal: 4 (62/[id number]/38 [id number])  Complications: No apparent anesthesia complications

## 2018-03-16 ENCOUNTER — Encounter (HOSPITAL_COMMUNITY): Payer: Self-pay | Admitting: Surgery

## 2018-03-20 NOTE — Progress Notes (Signed)
Please contact patient and notify of benign pathology results.   M. , MD, FACS [facility] Office: [phone]   

## 2018-03-28 DIAGNOSIS — E210 Primary hyperparathyroidism: Secondary | ICD-10-CM | POA: Diagnosis not present

## 2018-06-24 ENCOUNTER — Other Ambulatory Visit (INDEPENDENT_AMBULATORY_CARE_PROVIDER_SITE_OTHER): Payer: Self-pay | Admitting: Physician Assistant

## 2018-06-26 DIAGNOSIS — J069 Acute upper respiratory infection, unspecified: Secondary | ICD-10-CM | POA: Diagnosis not present

## 2018-06-26 DIAGNOSIS — J42 Unspecified chronic bronchitis: Secondary | ICD-10-CM | POA: Diagnosis not present

## 2018-06-26 DIAGNOSIS — K219 Gastro-esophageal reflux disease without esophagitis: Secondary | ICD-10-CM | POA: Diagnosis not present

## 2018-07-30 DIAGNOSIS — I10 Essential (primary) hypertension: Secondary | ICD-10-CM | POA: Diagnosis not present

## 2018-07-30 DIAGNOSIS — E7800 Pure hypercholesterolemia, unspecified: Secondary | ICD-10-CM | POA: Diagnosis not present

## 2018-08-01 DIAGNOSIS — H2513 Age-related nuclear cataract, bilateral: Secondary | ICD-10-CM | POA: Diagnosis not present

## 2018-08-01 DIAGNOSIS — H524 Presbyopia: Secondary | ICD-10-CM | POA: Diagnosis not present

## 2018-08-01 DIAGNOSIS — H40013 Open angle with borderline findings, low risk, bilateral: Secondary | ICD-10-CM | POA: Diagnosis not present

## 2018-08-01 DIAGNOSIS — Z794 Long term (current) use of insulin: Secondary | ICD-10-CM | POA: Diagnosis not present

## 2018-08-01 DIAGNOSIS — H5213 Myopia, bilateral: Secondary | ICD-10-CM | POA: Diagnosis not present

## 2018-08-01 DIAGNOSIS — E119 Type 2 diabetes mellitus without complications: Secondary | ICD-10-CM | POA: Diagnosis not present

## 2018-08-01 DIAGNOSIS — Z0100 Encounter for examination of eyes and vision without abnormal findings: Secondary | ICD-10-CM | POA: Diagnosis not present

## 2018-08-07 DIAGNOSIS — M1990 Unspecified osteoarthritis, unspecified site: Secondary | ICD-10-CM | POA: Diagnosis not present

## 2018-08-07 DIAGNOSIS — J42 Unspecified chronic bronchitis: Secondary | ICD-10-CM | POA: Diagnosis not present

## 2018-08-07 DIAGNOSIS — Z794 Long term (current) use of insulin: Secondary | ICD-10-CM | POA: Diagnosis not present

## 2018-08-07 DIAGNOSIS — Z7189 Other specified counseling: Secondary | ICD-10-CM | POA: Diagnosis not present

## 2018-08-07 DIAGNOSIS — E669 Obesity, unspecified: Secondary | ICD-10-CM | POA: Diagnosis not present

## 2018-08-07 DIAGNOSIS — I10 Essential (primary) hypertension: Secondary | ICD-10-CM | POA: Diagnosis not present

## 2018-08-07 DIAGNOSIS — E7800 Pure hypercholesterolemia, unspecified: Secondary | ICD-10-CM | POA: Diagnosis not present

## 2018-08-07 DIAGNOSIS — E1169 Type 2 diabetes mellitus with other specified complication: Secondary | ICD-10-CM | POA: Diagnosis not present

## 2018-08-07 DIAGNOSIS — Z6831 Body mass index (BMI) 31.0-31.9, adult: Secondary | ICD-10-CM | POA: Diagnosis not present

## 2018-09-10 DIAGNOSIS — E119 Type 2 diabetes mellitus without complications: Secondary | ICD-10-CM | POA: Diagnosis not present

## 2018-09-10 DIAGNOSIS — Z794 Long term (current) use of insulin: Secondary | ICD-10-CM | POA: Diagnosis not present

## 2018-09-10 DIAGNOSIS — E669 Obesity, unspecified: Secondary | ICD-10-CM | POA: Diagnosis not present

## 2018-09-10 DIAGNOSIS — K219 Gastro-esophageal reflux disease without esophagitis: Secondary | ICD-10-CM | POA: Diagnosis not present

## 2018-09-10 DIAGNOSIS — Z6832 Body mass index (BMI) 32.0-32.9, adult: Secondary | ICD-10-CM | POA: Diagnosis not present

## 2018-09-10 DIAGNOSIS — M810 Age-related osteoporosis without current pathological fracture: Secondary | ICD-10-CM | POA: Diagnosis not present

## 2018-09-10 DIAGNOSIS — I10 Essential (primary) hypertension: Secondary | ICD-10-CM | POA: Diagnosis not present

## 2018-09-10 DIAGNOSIS — Z8639 Personal history of other endocrine, nutritional and metabolic disease: Secondary | ICD-10-CM | POA: Diagnosis not present

## 2018-10-01 DIAGNOSIS — Z1231 Encounter for screening mammogram for malignant neoplasm of breast: Secondary | ICD-10-CM | POA: Diagnosis not present

## 2018-12-24 DIAGNOSIS — E7800 Pure hypercholesterolemia, unspecified: Secondary | ICD-10-CM | POA: Diagnosis not present

## 2018-12-24 DIAGNOSIS — I10 Essential (primary) hypertension: Secondary | ICD-10-CM | POA: Diagnosis not present

## 2018-12-24 DIAGNOSIS — E041 Nontoxic single thyroid nodule: Secondary | ICD-10-CM | POA: Diagnosis not present

## 2018-12-27 DIAGNOSIS — E210 Primary hyperparathyroidism: Secondary | ICD-10-CM | POA: Diagnosis not present

## 2018-12-27 DIAGNOSIS — I10 Essential (primary) hypertension: Secondary | ICD-10-CM | POA: Diagnosis not present

## 2018-12-27 DIAGNOSIS — J42 Unspecified chronic bronchitis: Secondary | ICD-10-CM | POA: Diagnosis not present

## 2018-12-27 DIAGNOSIS — Z0000 Encounter for general adult medical examination without abnormal findings: Secondary | ICD-10-CM | POA: Diagnosis not present

## 2018-12-27 DIAGNOSIS — F411 Generalized anxiety disorder: Secondary | ICD-10-CM | POA: Diagnosis not present

## 2018-12-27 DIAGNOSIS — E7800 Pure hypercholesterolemia, unspecified: Secondary | ICD-10-CM | POA: Diagnosis not present

## 2018-12-27 DIAGNOSIS — E1169 Type 2 diabetes mellitus with other specified complication: Secondary | ICD-10-CM | POA: Diagnosis not present

## 2018-12-27 DIAGNOSIS — E039 Hypothyroidism, unspecified: Secondary | ICD-10-CM | POA: Diagnosis not present

## 2018-12-27 DIAGNOSIS — E669 Obesity, unspecified: Secondary | ICD-10-CM | POA: Diagnosis not present

## 2019-01-13 ENCOUNTER — Ambulatory Visit (INDEPENDENT_AMBULATORY_CARE_PROVIDER_SITE_OTHER): Payer: Medicare HMO

## 2019-01-13 ENCOUNTER — Other Ambulatory Visit: Payer: Self-pay

## 2019-01-13 ENCOUNTER — Ambulatory Visit: Payer: Medicare HMO | Admitting: Podiatry

## 2019-01-13 ENCOUNTER — Encounter: Payer: Self-pay | Admitting: Podiatry

## 2019-01-13 ENCOUNTER — Other Ambulatory Visit: Payer: Self-pay | Admitting: Podiatry

## 2019-01-13 VITALS — BP 141/83 | HR 93 | Resp 16

## 2019-01-13 DIAGNOSIS — M779 Enthesopathy, unspecified: Secondary | ICD-10-CM

## 2019-01-13 DIAGNOSIS — N760 Acute vaginitis: Secondary | ICD-10-CM | POA: Insufficient documentation

## 2019-01-13 DIAGNOSIS — M722 Plantar fascial fibromatosis: Secondary | ICD-10-CM

## 2019-01-13 DIAGNOSIS — M79672 Pain in left foot: Secondary | ICD-10-CM | POA: Diagnosis not present

## 2019-01-13 DIAGNOSIS — M79671 Pain in right foot: Secondary | ICD-10-CM | POA: Diagnosis not present

## 2019-01-13 NOTE — Patient Instructions (Addendum)
Diabetes Mellitus and Foot Care Foot care is an important part of your health, especially when you have diabetes. Diabetes may cause you to have problems because of poor blood flow (circulation) to your feet and legs, which can cause your skin to:  Become thinner and drier.  Break more easily.  Heal more slowly.  Peel and crack. You may also have nerve damage (neuropathy) in your legs and feet, causing decreased feeling in them. This means that you may not notice minor injuries to your feet that could lead to more serious problems. Noticing and addressing any potential problems early is the best way to prevent future foot problems. How to care for your feet Foot hygiene  Wash your feet daily with warm water and mild soap. Do not use hot water. Then, pat your feet and the areas between your toes until they are completely dry. Do not soak your feet as this can dry your skin.  Trim your toenails straight across. Do not dig under them or around the cuticle. File the edges of your nails with an emery board or nail file.  Apply a moisturizing lotion or petroleum jelly to the skin on your feet and to dry, brittle toenails. Use lotion that does not contain alcohol and is unscented. Do not apply lotion between your toes. Shoes and socks  Wear clean socks or stockings every day. Make sure they are not too tight. Do not wear knee-high stockings since they may decrease blood flow to your legs.  Wear shoes that fit properly and have enough cushioning. Always look in your shoes before you put them on to be sure there are no objects inside.  To break in new shoes, wear them for just a few hours a day. This prevents injuries on your feet. Wounds, scrapes, corns, and calluses  Check your feet daily for blisters, cuts, bruises, sores, and redness. If you cannot see the bottom of your feet, use a mirror or ask someone for help.  Do not cut corns or calluses or try to remove them with medicine.  If you  find a minor scrape, cut, or break in the skin on your feet, keep it and the skin around it clean and dry. You may clean these areas with mild soap and water. Do not clean the area with peroxide, alcohol, or iodine.  If you have a wound, scrape, corn, or callus on your foot, look at it several times a day to make sure it is healing and not infected. Check for: ? Redness, swelling, or pain. ? Fluid or blood. ? Warmth. ? Pus or a bad smell. General instructions  Do not cross your legs. This may decrease blood flow to your feet.  Do not use heating pads or hot water bottles on your feet. They may burn your skin. If you have lost feeling in your feet or legs, you may not know this is happening until it is too late.  Protect your feet from hot and cold by wearing shoes, such as at the beach or on hot pavement.  Schedule a complete foot exam at least once a year (annually) or more often if you have foot problems. If you have foot problems, report any cuts, sores, or bruises to your health care provider immediately. Contact a health care provider if:  You have a medical condition that increases your risk of infection and you have any cuts, sores, or bruises on your feet.  You have an injury that is not   healing.  You have redness on your legs or feet.  You feel burning or tingling in your legs or feet.  You have pain or cramps in your legs and feet.  Your legs or feet are numb.  Your feet always feel cold.  You have pain around a toenail. Get help right away if:  You have a wound, scrape, corn, or callus on your foot and: ? You have pain, swelling, or redness that gets worse. ? You have fluid or blood coming from the wound, scrape, corn, or callus. ? Your wound, scrape, corn, or callus feels warm to the touch. ? You have pus or a bad smell coming from the wound, scrape, corn, or callus. ? You have a fever. ? You have a red line going up your leg. Summary  Check your feet every day  for cuts, sores, red spots, swelling, and blisters.  Moisturize feet and legs daily.  Wear shoes that fit properly and have enough cushioning.  If you have foot problems, report any cuts, sores, or bruises to your health care provider immediately.  Schedule a complete foot exam at least once a year (annually) or more often if you have foot problems. This information is not intended to replace advice given to you by your health care provider. Make sure you discuss any questions you have with your health care provider. Document Released: 01/21/2000 Document Revised: 03/07/2017 Document Reviewed: 02/25/2016 Elsevier Patient Education  2020 [place].   Plantar Fasciitis (Heel Spur Syndrome) with Rehab The plantar fascia is a fibrous, ligament-like, soft-tissue structure that spans the bottom of the foot. Plantar fasciitis is a condition that causes pain in the foot due to inflammation of the tissue. SYMPTOMS   Pain and tenderness on the underneath side of the foot.  Pain that worsens with standing or walking. CAUSES  Plantar fasciitis is caused by irritation and injury to the plantar fascia on the underneath side of the foot. Common mechanisms of injury include:  Direct trauma to bottom of the foot.  Damage to a small nerve that runs under the foot where the main fascia attaches to the heel bone.  Stress placed on the plantar fascia due to bone spurs. RISK INCREASES WITH:   Activities that place stress on the plantar fascia (running, jumping, pivoting, or cutting).  Poor strength and flexibility.  Improperly fitted shoes.  Tight calf muscles.  Flat feet.  Failure to warm-up properly before activity.  Obesity. PREVENTION  Warm up and stretch properly before activity.  Allow for adequate recovery between workouts.  Maintain physical fitness:  Strength, flexibility, and endurance.  Cardiovascular fitness.  Maintain a health body weight.  Avoid stress on the  plantar fascia.  Wear properly fitted shoes, including arch supports for individuals who have flat feet.  PROGNOSIS  If treated properly, then the symptoms of plantar fasciitis usually resolve without surgery. However, occasionally surgery is necessary.  RELATED COMPLICATIONS   Recurrent symptoms that may result in a chronic condition.  Problems of the lower back that are caused by compensating for the injury, such as limping.  Pain or weakness of the foot during push-off following surgery.  Chronic inflammation, scarring, and partial or complete fascia tear, occurring more often from repeated injections.  TREATMENT  Treatment initially involves the use of ice and medication to help reduce pain and inflammation. The use of strengthening and stretching exercises may help reduce pain with activity, especially stretches of the Achilles tendon. These exercises may be performed  at home or with a therapist. Your caregiver may recommend that you use heel cups of arch supports to help reduce stress on the plantar fascia. Occasionally, corticosteroid injections are given to reduce inflammation. If symptoms persist for greater than 6 months despite non-surgical (conservative), then surgery may be recommended.   MEDICATION   If pain medication is necessary, then nonsteroidal anti-inflammatory medications, such as aspirin and ibuprofen, or other minor pain relievers, such as acetaminophen, are often recommended.  Do not take pain medication within 7 days before surgery.  Prescription pain relievers may be given if deemed necessary by your caregiver. Use only as directed and only as much as you need.  Corticosteroid injections may be given by your caregiver. These injections should be reserved for the most serious cases, because they may only be given a certain number of times.  HEAT AND COLD  Cold treatment (icing) relieves pain and reduces inflammation. Cold treatment should be applied for 10 to  15 minutes every 2 to 3 hours for inflammation and pain and immediately after any activity that aggravates your symptoms. Use ice packs or massage the area with a piece of ice (ice massage).  Heat treatment may be used prior to performing the stretching and strengthening activities prescribed by your caregiver, physical therapist, or athletic trainer. Use a heat pack or soak the injury in warm water.  SEEK IMMEDIATE MEDICAL CARE IF:  Treatment seems to offer no benefit, or the condition worsens.  Any medications produce adverse side effects.  EXERCISES- RANGE OF MOTION (ROM) AND STRETCHING EXERCISES - Plantar Fasciitis (Heel Spur Syndrome) These exercises may help you when beginning to rehabilitate your injury. Your symptoms may resolve with or without further involvement from your physician, physical therapist or athletic trainer. While completing these exercises, remember:   Restoring tissue flexibility helps normal motion to return to the joints. This allows healthier, less painful movement and activity.  An effective stretch should be held for at least 30 seconds.  A stretch should never be painful. You should only feel a gentle lengthening or release in the stretched tissue.  RANGE OF MOTION - Toe Extension, Flexion  Sit with your right / left leg crossed over your opposite knee.  Grasp your toes and gently pull them back toward the top of your foot. You should feel a stretch on the bottom of your toes and/or foot.  Hold this stretch for 10 seconds.  Now, gently pull your toes toward the bottom of your foot. You should feel a stretch on the top of your toes and or foot.  Hold this stretch for 10 seconds. Repeat  times. Complete this stretch 3 times per day.   RANGE OF MOTION - Ankle Dorsiflexion, Active Assisted  Remove shoes and sit on a chair that is preferably not on a carpeted surface.  Place right / left foot under knee. Extend your opposite leg for support.  Keeping  your heel down, slide your right / left foot back toward the chair until you feel a stretch at your ankle or calf. If you do not feel a stretch, slide your bottom forward to the edge of the chair, while still keeping your heel down.  Hold this stretch for 10 seconds. Repeat 3 times. Complete this stretch 2 times per day.   STRETCH  Gastroc, Standing  Place hands on wall.  Extend right / left leg, keeping the front knee somewhat bent.  Slightly point your toes inward on your back foot.  Keeping  your right / left heel on the floor and your knee straight, shift your weight toward the wall, not allowing your back to arch.  You should feel a gentle stretch in the right / left calf. Hold this position for 10 seconds. Repeat 3 times. Complete this stretch 2 times per day.  STRETCH  Soleus, Standing  Place hands on wall.  Extend right / left leg, keeping the other knee somewhat bent.  Slightly point your toes inward on your back foot.  Keep your right / left heel on the floor, bend your back knee, and slightly shift your weight over the back leg so that you feel a gentle stretch deep in your back calf.  Hold this position for 10 seconds. Repeat 3 times. Complete this stretch 2 times per day.  STRETCH  Gastrocsoleus, Standing  Note: This exercise can place a lot of stress on your foot and ankle. Please complete this exercise only if specifically instructed by your caregiver.   Place the ball of your right / left foot on a step, keeping your other foot firmly on the same step.  Hold on to the wall or a rail for balance.  Slowly lift your other foot, allowing your body weight to press your heel down over the edge of the step.  You should feel a stretch in your right / left calf.  Hold this position for 10 seconds.  Repeat this exercise with a slight bend in your right / left knee. Repeat 3 times. Complete this stretch 2 times per day.   STRENGTHENING EXERCISES - Plantar Fasciitis  (Heel Spur Syndrome)  These exercises may help you when beginning to rehabilitate your injury. They may resolve your symptoms with or without further involvement from your physician, physical therapist or athletic trainer. While completing these exercises, remember:   Muscles can gain both the endurance and the strength needed for everyday activities through controlled exercises.  Complete these exercises as instructed by your physician, physical therapist or athletic trainer. Progress the resistance and repetitions only as guided.  STRENGTH - Towel Curls  Sit in a chair positioned on a non-carpeted surface.  Place your foot on a towel, keeping your heel on the floor.  Pull the towel toward your heel by only curling your toes. Keep your heel on the floor. Repeat 3 times. Complete this exercise 2 times per day.  STRENGTH - Ankle Inversion  Secure one end of a rubber exercise band/tubing to a fixed object (table, pole). Loop the other end around your foot just before your toes.  Place your fists between your knees. This will focus your strengthening at your ankle.  Slowly, pull your big toe up and in, making sure the band/tubing is positioned to resist the entire motion.  Hold this position for 10 seconds.  Have your muscles resist the band/tubing as it slowly pulls your foot back to the starting position. Repeat 3 times. Complete this exercises 2 times per day.  Document Released: 01/23/2005 Document Revised: 04/17/2011 Document Reviewed: 05/07/2008 [facility] Patient Information 2014 [place], [place].

## 2019-01-13 NOTE — Progress Notes (Signed)
   Subjective:    Patient ID: Valerie Wood, female    DOB: [birth year], 73 y.o.   MRN: [id number]  HPI    Review of Systems  All other systems reviewed and are negative.      Objective:   Physical Exam        Assessment & Plan:

## 2019-01-13 NOTE — Progress Notes (Signed)
Subjective:   Patient ID: Valerie Wood, female   DOB: 73 y.o.   MRN: [id number]   HPI Patient presents stating I am getting pain in both my feet with the left bothering you more than the right and I did have a history of fracture in the left foot and it seems that the problem has been more intense events since then.  Patient does not smoke currently and likes to be active but the foot is inhibiting her from being too active   Review of Systems  All other systems reviewed and are negative.       Objective:  Physical Exam Vitals signs and nursing note reviewed.  Constitutional:      Appearance: She is well-developed.  Pulmonary:     Effort: Pulmonary effort is normal.  Musculoskeletal: Normal range of motion.  Skin:    General: Skin is warm.  Neurological:     Mental Status: She is alert.     Neurovascular status found to be intact muscle strength found to be adequate range of motion within normal limits.  Patient is noted to have discomfort around the posterior tibial insertion into the navicular left and also forefoot discomfort around the third and fourth metatarsal phalangeal joint with inflammation fluid around the joint surfaces.  Patient is found to have good digital perfusion well oriented x3 with mild pain also noted in the right foot     Assessment:  Posterior tibial tendinitis left with inflammation at insertion navicular with history of fracture along with inflammatory capsulitis left over right     Plan:  H&P conditions reviewed x-rays reviewed with patient.  Today I did sterile prep and I injected the posterior tib insertion into the navicular left with 3 mg dexamethasone Kenalog 5 mg Xylocaine and I injected the third and fourth MPJs with 3 mg dexamethasone 5 mg Xylocaine and advised on rigid bottom shoes and reappoint to recheck and may require treatment on the right.  All dispensed fascial brace to lift up the arch  X-rays indicate there was a previous fracture of  the navicular left with moderate depression of the arch bilateral

## 2019-01-27 ENCOUNTER — Encounter: Payer: Self-pay | Admitting: Podiatry

## 2019-01-27 ENCOUNTER — Other Ambulatory Visit: Payer: Self-pay

## 2019-01-27 ENCOUNTER — Ambulatory Visit: Payer: Medicare HMO | Admitting: Podiatry

## 2019-01-27 VITALS — Temp 96.6°F

## 2019-01-27 DIAGNOSIS — R69 Illness, unspecified: Secondary | ICD-10-CM | POA: Diagnosis not present

## 2019-01-27 DIAGNOSIS — M79671 Pain in right foot: Secondary | ICD-10-CM

## 2019-01-27 DIAGNOSIS — G629 Polyneuropathy, unspecified: Secondary | ICD-10-CM

## 2019-01-27 DIAGNOSIS — M76822 Posterior tibial tendinitis, left leg: Secondary | ICD-10-CM

## 2019-01-27 DIAGNOSIS — M79672 Pain in left foot: Secondary | ICD-10-CM

## 2019-01-28 NOTE — Progress Notes (Signed)
Subjective:   Patient ID: Valerie Wood, female   DOB: 73 y.o.   MRN: [id number]   HPI Patient states that she is improved but she still getting some discomfort and pain if she does too much on her foot   ROS      Objective:  Physical Exam  Neurovascular status intact with patient's feet improved with diminished discomfort upon palpation     Assessment:  Improvement of pain with medication with mild discomfort still noted     Plan:  Reviewed discomfort in the continuation of conservative treatments for this including shoe gear modification and stretching exercises and over-the-counter insoles.  Reappoint to recheck

## 2019-02-28 ENCOUNTER — Ambulatory Visit: Payer: Medicare HMO | Attending: Internal Medicine

## 2019-02-28 DIAGNOSIS — Z23 Encounter for immunization: Secondary | ICD-10-CM | POA: Insufficient documentation

## 2019-03-03 ENCOUNTER — Observation Stay (HOSPITAL_COMMUNITY): Payer: Medicare HMO

## 2019-03-03 ENCOUNTER — Encounter (HOSPITAL_COMMUNITY): Payer: Self-pay

## 2019-03-03 ENCOUNTER — Emergency Department (HOSPITAL_COMMUNITY): Payer: Medicare HMO

## 2019-03-03 ENCOUNTER — Observation Stay (HOSPITAL_COMMUNITY)
Admission: EM | Admit: 2019-03-03 | Discharge: 2019-03-04 | Disposition: A | Discharge status: Home / Self Care | Payer: Medicare HMO | Attending: Family Medicine | Admitting: Family Medicine

## 2019-03-03 DIAGNOSIS — K7689 Other specified diseases of liver: Secondary | ICD-10-CM | POA: Diagnosis not present

## 2019-03-03 DIAGNOSIS — G43909 Migraine, unspecified, not intractable, without status migrainosus: Secondary | ICD-10-CM | POA: Diagnosis not present

## 2019-03-03 DIAGNOSIS — J449 Chronic obstructive pulmonary disease, unspecified: Secondary | ICD-10-CM | POA: Insufficient documentation

## 2019-03-03 DIAGNOSIS — N390 Urinary tract infection, site not specified: Secondary | ICD-10-CM | POA: Diagnosis present

## 2019-03-03 DIAGNOSIS — E119 Type 2 diabetes mellitus without complications: Secondary | ICD-10-CM

## 2019-03-03 DIAGNOSIS — Z79899 Other long term (current) drug therapy: Secondary | ICD-10-CM | POA: Diagnosis not present

## 2019-03-03 DIAGNOSIS — Z8541 Personal history of malignant neoplasm of cervix uteri: Secondary | ICD-10-CM | POA: Diagnosis not present

## 2019-03-03 DIAGNOSIS — R4701 Aphasia: Principal | ICD-10-CM | POA: Diagnosis present

## 2019-03-03 DIAGNOSIS — I10 Essential (primary) hypertension: Secondary | ICD-10-CM | POA: Diagnosis present

## 2019-03-03 DIAGNOSIS — R7989 Other specified abnormal findings of blood chemistry: Secondary | ICD-10-CM | POA: Diagnosis not present

## 2019-03-03 DIAGNOSIS — G4489 Other headache syndrome: Secondary | ICD-10-CM | POA: Diagnosis not present

## 2019-03-03 DIAGNOSIS — R110 Nausea: Secondary | ICD-10-CM | POA: Diagnosis not present

## 2019-03-03 DIAGNOSIS — R410 Disorientation, unspecified: Secondary | ICD-10-CM | POA: Diagnosis not present

## 2019-03-03 DIAGNOSIS — R519 Headache, unspecified: Secondary | ICD-10-CM | POA: Diagnosis not present

## 2019-03-03 DIAGNOSIS — I2510 Atherosclerotic heart disease of native coronary artery without angina pectoris: Secondary | ICD-10-CM | POA: Insufficient documentation

## 2019-03-03 DIAGNOSIS — G9341 Metabolic encephalopathy: Secondary | ICD-10-CM | POA: Diagnosis not present

## 2019-03-03 DIAGNOSIS — E785 Hyperlipidemia, unspecified: Secondary | ICD-10-CM | POA: Diagnosis not present

## 2019-03-03 DIAGNOSIS — K760 Fatty (change of) liver, not elsewhere classified: Secondary | ICD-10-CM | POA: Diagnosis not present

## 2019-03-03 DIAGNOSIS — Z794 Long term (current) use of insulin: Secondary | ICD-10-CM | POA: Diagnosis not present

## 2019-03-03 DIAGNOSIS — I252 Old myocardial infarction: Secondary | ICD-10-CM | POA: Insufficient documentation

## 2019-03-03 DIAGNOSIS — Z87891 Personal history of nicotine dependence: Secondary | ICD-10-CM | POA: Diagnosis not present

## 2019-03-03 DIAGNOSIS — R0902 Hypoxemia: Secondary | ICD-10-CM | POA: Diagnosis not present

## 2019-03-03 DIAGNOSIS — R4182 Altered mental status, unspecified: Secondary | ICD-10-CM | POA: Diagnosis not present

## 2019-03-03 DIAGNOSIS — G43109 Migraine with aura, not intractable, without status migrainosus: Secondary | ICD-10-CM

## 2019-03-03 DIAGNOSIS — R29810 Facial weakness: Secondary | ICD-10-CM | POA: Diagnosis not present

## 2019-03-03 DIAGNOSIS — N3000 Acute cystitis without hematuria: Secondary | ICD-10-CM | POA: Diagnosis not present

## 2019-03-03 DIAGNOSIS — Z20822 Contact with and (suspected) exposure to covid-19: Secondary | ICD-10-CM | POA: Diagnosis not present

## 2019-03-03 DIAGNOSIS — R945 Abnormal results of liver function studies: Secondary | ICD-10-CM | POA: Diagnosis not present

## 2019-03-03 DIAGNOSIS — Z85828 Personal history of other malignant neoplasm of skin: Secondary | ICD-10-CM | POA: Diagnosis not present

## 2019-03-03 LAB — URINALYSIS, ROUTINE W REFLEX MICROSCOPIC
Bilirubin Urine: NEGATIVE
Glucose, UA: NEGATIVE mg/dL
Hgb urine dipstick: NEGATIVE
Ketones, ur: NEGATIVE mg/dL
Nitrite: NEGATIVE
Protein, ur: NEGATIVE mg/dL
Specific Gravity, Urine: 1.006 (ref 1.005–1.030)
pH: 6 (ref 5.0–8.0)

## 2019-03-03 LAB — CBC
HCT: 44.5 % (ref 36.0–46.0)
Hemoglobin: 15 g/dL (ref 12.0–15.0)
MCH: 30.9 pg (ref 26.0–34.0)
MCHC: 33.7 g/dL (ref 30.0–36.0)
MCV: 91.6 fL (ref 80.0–100.0)
Platelets: 231 K/uL (ref 150–400)
RBC: 4.86 MIL/uL (ref 3.87–5.11)
RDW: 13.5 % (ref 11.5–15.5)
WBC: 7.3 K/uL (ref 4.0–10.5)
nRBC: 0 % (ref 0.0–0.2)

## 2019-03-03 LAB — DIFFERENTIAL
Abs Immature Granulocytes: 0.02 K/uL (ref 0.00–0.07)
Basophils Absolute: 0 K/uL (ref 0.0–0.1)
Basophils Relative: 1 %
Eosinophils Absolute: 0.2 K/uL (ref 0.0–0.5)
Eosinophils Relative: 3 %
Immature Granulocytes: 0 %
Lymphocytes Relative: 26 %
Lymphs Abs: 1.9 K/uL (ref 0.7–4.0)
Monocytes Absolute: 0.6 K/uL (ref 0.1–1.0)
Monocytes Relative: 8 %
Neutro Abs: 4.5 K/uL (ref 1.7–7.7)
Neutrophils Relative %: 62 %

## 2019-03-03 LAB — COMPREHENSIVE METABOLIC PANEL WITH GFR
ALT: 24 U/L (ref 0–44)
AST: 48 U/L — ABNORMAL HIGH (ref 15–41)
Albumin: 4 g/dL (ref 3.5–5.0)
Alkaline Phosphatase: 60 U/L (ref 38–126)
Anion gap: 11 (ref 5–15)
BUN: 18 mg/dL (ref 8–23)
CO2: 23 mmol/L (ref 22–32)
Calcium: 9.8 mg/dL (ref 8.9–10.3)
Chloride: 103 mmol/L (ref 98–111)
Creatinine, Ser: 0.86 mg/dL (ref 0.44–1.00)
GFR calc Af Amer: >60 mL/min
GFR calc non Af Amer: >60 mL/min
Glucose, Bld: 226 mg/dL — ABNORMAL HIGH (ref 70–99)
Potassium: 4.9 mmol/L (ref 3.5–5.1)
Sodium: 137 mmol/L (ref 135–145)
Total Bilirubin: 1.7 mg/dL — ABNORMAL HIGH (ref 0.3–1.2)
Total Protein: 6.7 g/dL (ref 6.5–8.1)

## 2019-03-03 LAB — PROTIME-INR
INR: 1 (ref 0.8–1.2)
Prothrombin Time: 12.8 seconds (ref 11.4–15.2)

## 2019-03-03 LAB — RAPID URINE DRUG SCREEN, HOSP PERFORMED
Amphetamines: NOT DETECTED
Barbiturates: NOT DETECTED
Benzodiazepines: NOT DETECTED
Cocaine: NOT DETECTED
Opiates: NOT DETECTED
Tetrahydrocannabinol: NOT DETECTED

## 2019-03-03 LAB — ETHANOL: Alcohol, Ethyl (B): <10 mg/dL

## 2019-03-03 LAB — CBG MONITORING, ED: Glucose-Capillary: 138 mg/dL — ABNORMAL HIGH (ref 70–99)

## 2019-03-03 MED ORDER — ACETAMINOPHEN 325 MG PO TABS: 650.0000 mg | ORAL_TABLET | ORAL | Status: DC | PRN

## 2019-03-03 MED ORDER — ATORVASTATIN CALCIUM 40 MG PO TABS: 40.0000 mg | ORAL_TABLET | Freq: Every day | ORAL | Status: DC

## 2019-03-03 MED ORDER — ACETAMINOPHEN 160 MG/5ML PO SOLN: 650.0000 mg | ORAL | Status: DC | PRN

## 2019-03-03 MED ORDER — INSULIN ASPART 100 UNIT/ML ~~LOC~~ SOLN: 0.0000 [IU] | Freq: Three times a day (TID) | SUBCUTANEOUS | Status: DC

## 2019-03-03 MED ORDER — LORAZEPAM 2 MG/ML IJ SOLN
1.0000 mg | Freq: Once | INTRAMUSCULAR | Status: AC
  Administered 2019-03-03: 1 mg via INTRAVENOUS
  Filled 2019-03-03: qty 1

## 2019-03-03 MED ORDER — ENOXAPARIN SODIUM 40 MG/0.4ML ~~LOC~~ SOLN
40.0000 mg | SUBCUTANEOUS | Status: DC
  Administered 2019-03-03: 40 mg via SUBCUTANEOUS
  Filled 2019-03-03: qty 0.4

## 2019-03-03 MED ORDER — SODIUM CHLORIDE 0.9 % IV SOLN
1.0000 g | INTRAVENOUS | Status: DC
  Administered 2019-03-03: 1 g via INTRAVENOUS
  Filled 2019-03-03: qty 10

## 2019-03-03 MED ORDER — ACETAMINOPHEN 650 MG RE SUPP: 650.0000 mg | RECTAL | Status: DC | PRN

## 2019-03-03 MED ORDER — STROKE: EARLY STAGES OF RECOVERY BOOK
Freq: Once | Status: AC
  Filled 2019-03-03: qty 1

## 2019-03-03 MED ORDER — INSULIN ASPART 100 UNIT/ML ~~LOC~~ SOLN: 0.0000 [IU] | Freq: Every day | SUBCUTANEOUS | Status: DC

## 2019-03-03 MED ORDER — ASPIRIN 325 MG PO TABS
325.0000 mg | ORAL_TABLET | Freq: Every day | ORAL | Status: DC
  Administered 2019-03-03: 325 mg via ORAL
  Filled 2019-03-03: qty 1

## 2019-03-03 NOTE — ED Triage Notes (Signed)
Pt BIB [facility] from home with AMS. Pt was having moments of expressive aphagia in which she states "my brain and words aren't connecting". She had trouble remembering common things as well.  Pt is now A&Ox4.  LSN is 1600 today.   Only other complaint was en route in which pts BP rose to 220/120 and pt had nausea and tremors but eventually went away en route as well.  CBG 160 BP 164/94 EKG unremarkable.

## 2019-03-03 NOTE — ED Notes (Signed)
MD at bedside. 

## 2019-03-03 NOTE — ED Notes (Signed)
Patient transported to CT 

## 2019-03-03 NOTE — ED Provider Notes (Signed)
[facility] EMERGENCY DEPARTMENT Provider Note   CSN: [id number] Arrival date & time:        History Chief Complaint  Patient presents with  . Altered Mental Status    GLORINE GERARDO is a 74 y.o. female.  Patient is a 74 year old female who presents with possible aphasia.  She has a history of COPD, diabetes, hypertension, hyperlipidemia and head trauma about 5 years ago with postconcussive symptoms.  She says that she was talking on the phone with her daughter and suddenly felt like she could not get her words out.  She said it felt like her brain and her body were connected.  Her daughter states that she would say words but could not form sentences and was trying to talk but could not get sentences out.  There was no known slurred speech.  Patient said that she was able to move everything normally.  She denies any vision changes.  She said the symptoms lasted about 30 or 45 minutes.  She is improved markedly and is able to talk normally at this point.  She still feels like her brain is not fully connected but she does not have any difficulty getting her words out.  She did have a migraine earlier today but denies any significant headache currently.  She has a history of migraines since her concussion and says it was typical for her migraines although she has not had one in a while.  She denies any numbness or weakness to her extremities.  No recent illnesses.        Past Medical History:  Diagnosis Date  . Anxiety   . Arthritis    hands, all over, back  . Atherosclerosis 2017  . 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 Bilateral dry eyes   . Cataract    Bilateral  . COPD (chronic obstructive pulmonary disease) ([place])   . Diabetes mellitus    pt didn't divulge DM during PAT interview but Dr. Paulene Floor nurse states that pt is type 2 diabetic, controlled by diet.  . Diverticulosis, sigmoid   . Duodenitis without mention of hemorrhage   . Enterocele     History of  . Esophagitis, unspecified   . Fibromyalgia   . Gastroparesis due to DM ([place])   . GERD (gastroesophageal reflux disease)   . Glaucoma   . History of concussion   . Hx of colonic polyps   . Hyperlipidemia   . Hypertension   . L4-L5 disc bulge 03/2003  . Migraine   . Myocardial infarction [facility])    NSTEMI, patient states no one told her of this  . Osteopenia   . 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 PONV (postoperative nausea and vomiting)   . Rectocele    History of  . Right rib fracture 12/20/2013  . Seasonal allergies   . Shortness of breath    quit smoking 2013  . Skin cancer    Nose  . Stricture and stenosis of esophagus   . Thyroid nodule    Bilateral  . Tobacco dependence   . Uterine cancer ([place]) 1989  . Vertigo   . Vitamin D deficiency     Patient Active Problem List   Diagnosis Date Noted  . Aphasia 03/03/2019  . UTI (urinary tract infection) 03/03/2019  . Vulvovaginitis 01/13/2019  . Preoperative clearance 03/12/2018  . CAD (coronary artery disease), native coronary artery 03/12/2018  . Hyperparathyroidism, primary ([place]) 03/09/2018  . Acute pain of right shoulder  05/28/2017  . Chest pain at rest   . Hyperglycemia   . NSTEMI (non-ST elevated myocardial infarction) ([place])   . Diabetes mellitus without complication ([place]) [id number]  . Essential hypertension 04/28/2014  . HLD (hyperlipidemia) 04/28/2014  . Chest pain 04/28/2014  . Pain in the chest   . Fall 12/22/2013  . Concussion 12/22/2013  . Lumbar transverse process fracture ([place]) 12/22/2013  . Migraine 12/22/2013  . DM (diabetes mellitus) ([place]) 12/22/2013  . Fracture of multiple ribs of right side 12/19/2013  . GERD (gastroesophageal reflux disease) 08/03/2011  . Gastroparesis 08/03/2011  . Anxiety 08/03/2011  . Rectocele 05/12/2011  . 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 COLONIC POLYPS, ADENOMATOUS, HX OF 05/02/2007    Past Surgical History:  Procedure Laterality Date  . APPENDECTOMY    .  BLADDER SURGERY     Tack   . CHOLECYSTECTOMY    . COLONOSCOPY  last one 11/09/2017   multiple  . DILATION AND EVACUATION    . ESOPHAGOGASTRODUODENOSCOPY  last one 12/06/2004   multiple  . LEEP    . PARATHYROIDECTOMY Right 03/15/2018   Procedure: RIGHT INFERIOR PARATHYROIDECTOMY;  Surgeon: Armandina Gemma, MD;  Location: [facility];  Service: General;  Laterality: Right;  . reclast     2019  . RECTOCELE REPAIR  05/11/2011   Procedure: POSTERIOR REPAIR (RECTOCELE);  Surgeon: Cheri Fowler, MD;  Location: [facility];  Service: Gynecology;  Laterality: N/A;  . TONSILLECTOMY    . TUBAL LIGATION    . VAGINAL HYSTERECTOMY  12/1987   BSO     OB History   No obstetric history on file.     Family History  Problem Relation Age of Onset  . Colon cancer Father 32  . Stomach cancer Father   . Heart disease Mother        MI, CVA  . Rectal cancer Neg Hx   . Esophageal cancer Neg Hx     Social History   Tobacco Use  . Smoking status: Former Smoker    Packs/day: 1.00    Years: 40.00    Pack years: 40.00    Types: Cigarettes    Quit date: 02/22/2011    Years since quitting: 8.0  . Smokeless tobacco: Never Used  Substance Use Topics  . Alcohol use: No  . Drug use: No    Home Medications Prior to Admission medications   Medication Sig Start Date End Date Taking? Authorizing Provider  atorvastatin (LIPITOR) 40 MG tablet Take 40 mg by mouth at bedtime.    Yes [provider]  Biotin 5 MG TABS Take 5 mg by mouth at bedtime.    Yes [provider]  celecoxib (CELEBREX) 200 MG capsule Take 1 capsule (200 mg total) by mouth 2 (two) times daily. Patient taking differently: Take 200 mg by mouth at bedtime.  10/03/17  Yes Aundra Dubin, PA-C  cetirizine (ZYRTEC) 10 MG tablet Take 10 mg by mouth at bedtime.    Yes [provider]  cholecalciferol (VITAMIN D) 1000 UNITS tablet Take 1,000 Units by mouth at bedtime.    Yes [provider]  fluticasone (FLONASE) 50  MCG/ACT nasal spray Place 2 sprays into both nostrils daily as needed for allergies or rhinitis.   Yes [provider]  insulin NPH-regular Human (NOVOLIN 70/30) (70-30) 100 UNIT/ML injection Inject 40-50 Units into the skin 2 (two) times daily. Inject 40 units subcutaneously in the morning and 50 units at supper   Yes [provider]  losartan (  COZAAR) 50 MG tablet Take 50 mg by mouth at bedtime.    Yes [provider]  Multiple Vitamin (MULTIVITAMIN WITH MINERALS) TABS tablet Take 1 tablet by mouth at bedtime.    Yes [provider]  omeprazole (PRILOSEC) 20 MG capsule Take 20 mg by mouth at bedtime.    Yes [provider]  SYMBICORT 80-4.5 MCG/ACT inhaler Inhale 2 puffs into the lungs daily. 02/11/19  Yes [provider]  Accu-Chek FastClix Lancets MISC  08/19/18   [provider]  ACCU-CHEK SMARTVIEW test strip  01/03/19   [provider]  BD VEO INSULIN SYRINGE U/F 31G X 15/64" 0.5 ML MISC  08/28/18   [provider]  HYDROcodone-acetaminophen (NORCO/VICODIN) 5-325 MG tablet Take 1-2 tablets by mouth every 4 (four) hours as needed for moderate pain. Patient not taking: Reported on 03/03/2019 03/15/18   Armandina Gemma, MD  RELION PEN NEEDLES 32G X 4 MM MISC  12/19/18   [provider]    Allergies    Canagliflozin, Macrolides and ketolides, Morphine and related, Tetracyclines & related, Tramadol, Doxycycline hyclate, Gabapentin, Glipizide, Iloperidone, Januvia [sitagliptin], Macrobid [nitrofurantoin macrocrystal], Metformin, Nitrofurantoin, Pantoprazole, Symbicort [budesonide-formoterol fumarate], Victoza [liraglutide], and Ciprofloxacin hcl  Review of Systems   Review of Systems  Constitutional: Negative for chills, diaphoresis, fatigue and fever.  HENT: Negative for congestion, rhinorrhea and sneezing.   Eyes: Negative.   Respiratory: Negative for cough, chest tightness and shortness of breath.     Cardiovascular: Negative for chest pain and leg swelling.  Gastrointestinal: Positive for nausea. Negative for abdominal pain, blood in stool, diarrhea and vomiting.  Genitourinary: Negative for difficulty urinating, flank pain, frequency and hematuria.  Musculoskeletal: Negative for arthralgias and back pain.  Skin: Negative for rash.  Neurological: Positive for speech difficulty and headaches. Negative for dizziness, weakness and numbness.    Physical Exam Updated Vital Signs BP 135/76 (BP Location: Left Arm)   Pulse 96   Temp 98 F (36.7 C) (Oral)   Resp 13   SpO2 92%   Physical Exam Constitutional:      Appearance: She is well-developed.  HENT:     Head: Normocephalic and atraumatic.  Eyes:     Extraocular Movements: Extraocular movements intact.     Conjunctiva/sclera: Conjunctivae normal.     Pupils: Pupils are equal, round, and reactive to light.  Cardiovascular:     Rate and Rhythm: Normal rate and regular rhythm.     Heart sounds: Normal heart sounds.  Pulmonary:     Effort: Pulmonary effort is normal. No respiratory distress.     Breath sounds: Normal breath sounds. No wheezing or rales.  Chest:     Chest wall: No tenderness.  Abdominal:     General: Bowel sounds are normal.     Palpations: Abdomen is soft.     Tenderness: There is no abdominal tenderness. There is no guarding or rebound.  Musculoskeletal:        General: Normal range of motion.     Cervical back: Normal range of motion and neck supple.  Lymphadenopathy:     Cervical: No cervical adenopathy.  Skin:    General: Skin is warm and dry.     Findings: No rash.  Neurological:     Mental Status: She is alert and oriented to person, place, and time.     Comments: Motor 5/5 all extremities Sensation grossly intact to LT all extremities Finger to Nose intact, no pronator drift CN II-XII grossly intact Gait  normal Visual fields full to confrontation     ED Results / Procedures / Treatments    Labs (all labs ordered are listed, but only abnormal results are displayed) Labs Reviewed  COMPREHENSIVE METABOLIC PANEL - Abnormal; Notable for the following components:      Result Value   Glucose, Bld 226 (*)    AST 48 (*)    Total Bilirubin 1.7 (*)    All other components within normal limits  URINALYSIS, ROUTINE W REFLEX MICROSCOPIC - Abnormal; Notable for the following components:   Leukocytes,Ua MODERATE (*)    Bacteria, UA MANY (*)    All other components within normal limits  CBG MONITORING, ED - Abnormal; Notable for the following components:   Glucose-Capillary 138 (*)    All other components within normal limits  URINE CULTURE  SARS CORONAVIRUS 2 (TAT 6-24 HRS)  ETHANOL  CBC  DIFFERENTIAL  RAPID URINE DRUG SCREEN, HOSP PERFORMED  PROTIME-INR  HEMOGLOBIN A1C  LIPID PANEL  I-STAT CHEM 8, ED    EKG EKG Interpretation  Date/Time:  Monday March 03 2019 18:56:23 EST Ventricular Rate:  94 PR Interval:    QRS Duration: 97 QT Interval:  361 QTC Calculation: 452 R Axis:   -29 Text Interpretation: Sinus rhythm Abnormal R-wave progression, early transition Left ventricular hypertrophy Inferior infarct, old since last tracing no significant change Confirmed by Malvin Johns [phone]) on 03/03/2019 7:52:48 PM   Radiology CT HEAD WO CONTRAST  Result Date: 03/03/2019 CLINICAL DATA:  Expressive aphasia. EXAM: CT HEAD WITHOUT CONTRAST TECHNIQUE: Contiguous axial images were obtained from the base of the skull through the vertex without intravenous contrast. COMPARISON:  12/19/2013 FINDINGS: Brain: No acute cortically based infarct, intracranial hemorrhage, mass, midline shift, or extra-axial fluid collection is identified. Hypodensities in the cerebral white matter bilaterally have progressed from the prior CT and are nonspecific but compatible with chronic small vessel ischemic disease, mildly advanced for age. Mild cerebral atrophy is within normal limits for age. Vascular:  Calcified atherosclerosis at the skull base. No hyperdense vessel. Skull: No fracture suspicious osseous lesion. Sinuses/Orbits: Visualized paranasal sinuses and mastoid air cells are clear. Orbits are unremarkable. Other: None. IMPRESSION: 1. No evidence of acute intracranial abnormality. 2. Mild chronic small vessel ischemic disease. Electronically Signed   By: Logan Bores M.D.   On: 03/03/2019 20:08   MR BRAIN WO CONTRAST  Result Date: 03/03/2019 CLINICAL DATA:  Initial evaluation for acute altered mental status, aphasia, headache. EXAM: MRI HEAD WITHOUT CONTRAST TECHNIQUE: Multiplanar, multiecho pulse sequences of the brain and surrounding structures were obtained without intravenous contrast. COMPARISON:  Prior CT from earlier the same day. FINDINGS: Brain: Cerebral volume within normal limits for patient age. Patchy T2/FLAIR hyperintensity seen within the periventricular and deep white matter of both cerebral hemispheres, nonspecific, but most like related chronic microvascular ischemic disease, mild for age. No abnormal foci of restricted diffusion to suggest acute or subacute ischemia. Gray-white matter differentiation well maintained. No encephalomalacia to suggest chronic infarction. No foci of susceptibility artifact to suggest acute or chronic intracranial hemorrhage. No mass lesion, midline shift or mass effect. No hydrocephalus. No extra-axial fluid collection. Major dural sinuses are grossly patent. Pituitary gland and suprasellar region are normal. Midline structures intact and normal. Vascular: Major intracranial vascular flow voids well maintained and normal in appearance. Skull and upper cervical spine: Craniocervical junction normal. Visualized upper cervical spine within normal limits. Bone marrow signal intensity normal. Small lipoma noted at the right frontal scalp. Scalp soft tissues  otherwise unremarkable. Sinuses/Orbits: Globes and orbital soft tissues within normal limits. Paranasal  sinuses are clear. No mastoid effusion. Inner ear structures normal. Other: None. IMPRESSION: 1. No acute intracranial abnormality. 2. Mild chronic microvascular ischemic disease for age. Electronically Signed   By: Jeannine Boga M.D.   On: 03/03/2019 23:51   US Abdomen Limited RUQ  Result Date: 03/03/2019 CLINICAL DATA:  74 year old female with elevated liver enzymes. Prior cholecystectomy. EXAM: ULTRASOUND ABDOMEN LIMITED RIGHT UPPER QUADRANT COMPARISON:  CT Abdomen and Pelvis 04/30/2015. FINDINGS: Gallbladder: Surgically absent. Common bile duct: Diameter: 8-9 millimeters diameter, stable compared to the 2017 CT. No intraductal filling defect identified. Liver: Echogenic liver (image 13). No discrete liver lesion. No intrahepatic biliary ductal dilatation identified. Portal vein is patent on color Doppler imaging with normal direction of blood flow towards the liver. Other: Negative visible right kidney. IMPRESSION: 1. Evidence of hepatic steatosis. 2. Chronic cholecystectomy with stable mild postoperative CBD enlargement since 2017. Electronically Signed   By: Genevie Ann M.D.   On: 03/03/2019 23:52    Procedures Procedures (including critical care time)  Medications Ordered in ED Medications  acetaminophen (TYLENOL) tablet 650 mg (has no administration in time range)    Or  acetaminophen (TYLENOL) 160 MG/5ML solution 650 mg (has no administration in time range)    Or  acetaminophen (TYLENOL) suppository 650 mg (has no administration in time range)  enoxaparin (LOVENOX) injection 40 mg (40 mg Subcutaneous Given 03/03/19 2347)  insulin aspart (novoLOG) injection 0-9 Units (has no administration in time range)  insulin aspart (novoLOG) injection 0-5 Units (0 Units Subcutaneous Not Given 03/03/19 2340)  cefTRIAXone (ROCEPHIN) 1 g in sodium chloride 0.9 % 100 mL IVPB (1 g Intravenous New Bag/Given 03/03/19 2350)  aspirin tablet 325 mg (325 mg Oral Given 03/03/19 2352)  atorvastatin (LIPITOR)  tablet 40 mg (has no administration in time range)   stroke: mapping our early stages of recovery book ( Does not apply Given 03/03/19 2347)  LORazepam (ATIVAN) injection 1 mg (1 mg Intravenous Given 03/03/19 2219)    ED Course  I have reviewed the triage vital signs and the nursing notes.  Pertinent labs & imaging results that were available during my care of the patient were reviewed by me and considered in my medical decision making (see chart for details).    MDM Rules/Calculators/A&P                      Patient presented with an episode of aphasia.  This had resolved by the time she presented to the ED.  CT scan shows no acute abnormality.  Her labs are nonconcerning.  I spoke with Dr. Lorraine Lax he will see the patient.  He recommends admission to the hospitalist service for further stroke evaluation.  I spoke with the hospitalist to admit the patient for further treatment. Final Clinical Impression(s) / ED Diagnoses Final diagnoses:  Elevated LFTs  Aphasia    Rx / DC Orders ED Discharge Orders    None       Malvin Johns, MD 03/04/19 0013

## 2019-03-03 NOTE — H&P (Signed)
History and Physical    ANNAI Wood [id number] DOB: 1946-01-19 DOA: 03/03/2019  PCP: Mayra Neer, MD Patient coming from: Home  Chief Complaint: Altered mental status, aphasia  HPI: Valerie Wood is a 74 y.o. female with medical history significant of COPD, insulin-dependent diabetes mellitus, fibromyalgia, GERD, gastroparesis, atrophic gastritis, esophagitis, hypertension, hyperlipidemia, and conditions listed below presenting to the ED via EMS for evaluation of altered mental status and aphasia.  Patient states around 5 PM today she started having a headache.  While talking to her daughter on the phone she had difficulty getting words out and was confused.  She also had difficulty entering her password into the phone as she was confused.  Denies any weakness or numbness in her upper or lower extremities.  Denies facial droop.  Denies history of prior stroke.  States her symptoms resolved by the time she reached the hospital.  States she was in her usual state of health until this episode today.  No recent fevers, chills, cough, or shortness of breath.  Does report dysuria.   Blood pressure significantly elevated at 220/120 with EMS.  CBG 160.  ED Course: Blood pressure 143/74 on arrival and remainder of vital signs stable.  CBC unremarkable.  Blood glucose 226.  T bili 1.7, AST mildly elevated at 48.  ALT and alk phos normal.  Blood ethanol level undetectable.  UDS negative.  UA with moderate amount of leukocytes, 21-50 WBCs, and many bacteria.  Urine culture pending.  Head CT negative for acute intracranial abnormality.  ED provider discussed the case with neurology, recommended admission for stroke work-up.  Neurology will consult.  Review of Systems:  All systems reviewed and apart from history of presenting illness, are negative.  Past Medical History:  Diagnosis Date  . Anxiety   . Arthritis    hands, all over, back  . Atherosclerosis 2017  . Atrophic gastritis without mention  of hemorrhage   . Bilateral dry eyes   . Cataract    Bilateral  . COPD (chronic obstructive pulmonary disease) ([place])   . Diabetes mellitus    pt didn't divulge DM during PAT interview but Dr. Paulene Floor nurse states that pt is type 2 diabetic, controlled by diet.  . Diverticulosis, sigmoid   . Duodenitis without mention of hemorrhage   . Enterocele    History of  . Esophagitis, unspecified   . Fibromyalgia   . Gastroparesis due to DM ([place])   . GERD (gastroesophageal reflux disease)   . Glaucoma   . History of concussion   . Hx of colonic polyps   . Hyperlipidemia   . Hypertension   . L4-L5 disc bulge 03/2003  . Migraine   . Myocardial infarction [facility])    NSTEMI, patient states no one told her of this  . Osteopenia   . 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 PONV (postoperative nausea and vomiting)   . Rectocele    History of  . Right rib fracture 12/20/2013  . Seasonal allergies   . Shortness of breath    quit smoking 2013  . Skin cancer    Nose  . Stricture and stenosis of esophagus   . Thyroid nodule    Bilateral  . Tobacco dependence   . Uterine cancer ([place]) 1989  . Vertigo   . Vitamin D deficiency     Past Surgical History:  Procedure Laterality Date  . APPENDECTOMY    . BLADDER SURGERY     Tack   . CHOLECYSTECTOMY    .  COLONOSCOPY  last one 11/09/2017   multiple  . DILATION AND EVACUATION    . ESOPHAGOGASTRODUODENOSCOPY  last one 12/06/2004   multiple  . LEEP    . PARATHYROIDECTOMY Right 03/15/2018   Procedure: RIGHT INFERIOR PARATHYROIDECTOMY;  Surgeon: Armandina Gemma, MD;  Location: [facility];  Service: General;  Laterality: Right;  . reclast     2019  . RECTOCELE REPAIR  05/11/2011   Procedure: POSTERIOR REPAIR (RECTOCELE);  Surgeon: Cheri Fowler, MD;  Location: [place];  Service: Gynecology;  Laterality: N/A;  . TONSILLECTOMY    . TUBAL LIGATION    . VAGINAL HYSTERECTOMY  12/1987   BSO     reports that she quit smoking about 8 years ago. Her smoking use  included cigarettes. She has a 40.00 pack-year smoking history. She has never used smokeless tobacco. She reports that she does not drink alcohol or use drugs.  Allergies  Allergen Reactions  . Canagliflozin Itching and Nausea And Vomiting    Yeast infection  . Macrolides And Ketolides Nausea And Vomiting  . Morphine And Related Nausea And Vomiting  . Tetracyclines & Related Itching and Nausea And Vomiting  . Tramadol Nausea And Vomiting  . Doxycycline Hyclate     Stomach upset  . Gabapentin     Felt bad  . Glipizide Diarrhea  . Iloperidone Dermatitis  . Januvia [Sitagliptin] Nausea And Vomiting  . Macrobid [Nitrofurantoin Macrocrystal] Nausea And Vomiting  . Metformin   . Nitrofurantoin     n/v  . Pantoprazole Nausea And Vomiting  . Symbicort [Budesonide-Formoterol Fumarate]     Thrush   . Victoza [Liraglutide] Nausea And Vomiting  . Ciprofloxacin Hcl Rash    Family History  Problem Relation Age of Onset  . Colon cancer Father 20  . Stomach cancer Father   . Heart disease Mother        MI, CVA  . Rectal cancer Neg Hx   . Esophageal cancer Neg Hx     Prior to Admission medications   Medication Sig Start Date End Date Taking? Authorizing Provider  Accu-Chek FastClix Lancets MISC  08/19/18   [provider]  ACCU-CHEK SMARTVIEW test strip  01/03/19   [provider]  atorvastatin (LIPITOR) 40 MG tablet Take 40 mg by mouth at bedtime.     [provider]  BD VEO INSULIN SYRINGE U/F 31G X 15/64" 0.5 ML MISC  08/28/18   [provider]  Biotin 5 MG TABS Take 5 mg by mouth daily.     [provider]  budesonide-formoterol (SYMBICORT) 160-4.5 MCG/ACT inhaler Inhale 2 puffs into the lungs daily.     [provider]  celecoxib (CELEBREX) 200 MG capsule Take 1 capsule (200 mg total) by mouth 2 (two) times daily. Patient taking differently: Take 200 mg by mouth daily.  10/03/17   Aundra Dubin, PA-C  cetirizine (ZYRTEC) 10 MG  tablet Take 10 mg by mouth at bedtime.     [provider]  cholecalciferol (VITAMIN D) 1000 UNITS tablet Take 1,000 Units by mouth daily.    [provider]  cycloSPORINE (RESTASIS) 0.05 % ophthalmic emulsion Place 1 drop into both eyes 2 (two) times daily.    [provider]  fluticasone (FLONASE) 50 MCG/ACT nasal spray Place 2 sprays into both nostrils daily as needed for allergies or rhinitis.    [provider]  FLUZONE HIGH-DOSE QUADRIVALENT 0.7 ML SUSY  10/09/18   [provider]  HYDROcodone-acetaminophen (NORCO/VICODIN) 5-325 MG  tablet Take 1-2 tablets by mouth every 4 (four) hours as needed for moderate pain. 03/15/18   Armandina Gemma, MD  insulin NPH-regular Human (NOVOLIN 70/30) (70-30) 100 UNIT/ML injection Inject 50-60 Units into the skin 2 (two) times daily. Inject 50 units subcutaneously in the morning and 60 units at night    [provider]  lidocaine (XYLOCAINE) 2 % solution  07/26/18   [provider]  losartan (COZAAR) 50 MG tablet Take 50 mg by mouth at bedtime.     [provider]  Multiple Vitamin (MULTIVITAMIN WITH MINERALS) TABS tablet Take 1 tablet by mouth daily.    [provider]  omeprazole (PRILOSEC) 20 MG capsule Take 20 mg by mouth at bedtime.     [provider]  RELION PEN NEEDLES 32G X 4 MM MISC  12/19/18   [provider]  [place] 2.5 MCG/ACT AERS  08/07/18   [provider]    Physical Exam: Vitals:   03/03/19 1915 03/03/19 1930 03/03/19 2107  BP: (!) 143/74 (!) 143/73 135/76  Pulse: 98 93 96  Resp:   13  Temp:   98 F (36.7 C)  TempSrc:   Oral  SpO2: 91% 92% 92%    Physical Exam  Constitutional: She is oriented to person, place, and time. She appears well-developed and well-nourished. No distress.  HENT:  Head: Normocephalic.  Mouth/Throat: Oropharynx is clear and moist.  Eyes: Pupils are equal, round, and reactive to light. EOM are  normal.  Cardiovascular: Normal rate, regular rhythm and intact distal pulses.  Pulmonary/Chest: Effort normal and breath sounds normal. No respiratory distress. She has no wheezes. She has no rales.  Abdominal: Soft. Bowel sounds are normal. She exhibits no distension. There is no abdominal tenderness.  Musculoskeletal:        General: No edema.     Cervical back: Neck supple.  Neurological: She is alert and oriented to person, place, and time. No cranial nerve deficit.  Speech fluent, tongue midline, no facial droop Strength 5 out of 5 in bilateral upper and lower extremities. Sensation to light touch intact throughout.  Skin: Skin is warm and dry. She is not diaphoretic.     Labs on Admission: I have personally reviewed following labs and imaging studies  CBC: Recent Labs  Lab 03/03/19 1916  WBC 7.3  NEUTROABS 4.5  HGB 15.0  HCT 44.5  MCV 91.6  PLT 016   Basic Metabolic Panel: Recent Labs  Lab 03/03/19 1916  NA 137  K 4.9  CL 103  CO2 23  GLUCOSE 226*  BUN 18  CREATININE 0.86  CALCIUM 9.8   GFR: CrCl cannot be calculated (Unknown ideal weight.). Liver Function Tests: Recent Labs  Lab 03/03/19 1916  AST 48*  ALT 24  ALKPHOS 60  BILITOT 1.7*  PROT 6.7  ALBUMIN 4.0   No results for input(s): LIPASE, AMYLASE in the last 168 hours. No results for input(s): AMMONIA in the last 168 hours. Coagulation Profile: Recent Labs  Lab 03/03/19 1957  INR 1.0   Cardiac Enzymes: No results for input(s): CKTOTAL, CKMB, CKMBINDEX, TROPONINI in the last 168 hours. BNP (last 3 results) No results for input(s): PROBNP in the last 8760 hours. HbA1C: No results for input(s): HGBA1C in the last 72 hours. CBG: No results for input(s): GLUCAP in the last 168 hours. Lipid Profile: No results for input(s): CHOL, HDL, LDLCALC, TRIG, CHOLHDL, LDLDIRECT in the last 72 hours. Thyroid Function Tests: No results for input(s):  TSH, T4TOTAL, FREET4, T3FREE, THYROIDAB in the last  72 hours. Anemia Panel: No results for input(s): VITAMINB12, FOLATE, FERRITIN, TIBC, IRON, RETICCTPCT in the last 72 hours. Urine analysis:    Component Value Date/Time   COLORURINE YELLOW 03/03/2019 1933   APPEARANCEUR CLEAR 03/03/2019 1933   LABSPEC 1.006 03/03/2019 1933   PHURINE 6.0 03/03/2019 1933   GLUCOSEU NEGATIVE 03/03/2019 [place] NEGATIVE 03/03/2019 [place] 03/03/2019 [place] NEGATIVE 03/03/2019 1933   PROTEINUR NEGATIVE 03/03/2019 1933   UROBILINOGEN 1.0 12/23/2013 1041   NITRITE NEGATIVE 03/03/2019 1933   LEUKOCYTESUR MODERATE (A) 03/03/2019 1933    Radiological Exams on Admission: CT HEAD WO CONTRAST  Result Date: 03/03/2019 CLINICAL DATA:  Expressive aphasia. EXAM: CT HEAD WITHOUT CONTRAST TECHNIQUE: Contiguous axial images were obtained from the base of the skull through the vertex without intravenous contrast. COMPARISON:  12/19/2013 FINDINGS: Brain: No acute cortically based infarct, intracranial hemorrhage, mass, midline shift, or extra-axial fluid collection is identified. Hypodensities in the cerebral white matter bilaterally have progressed from the prior CT and are nonspecific but compatible with chronic small vessel ischemic disease, mildly advanced for age. Mild cerebral atrophy is within normal limits for age. Vascular: Calcified atherosclerosis at the skull base. No hyperdense vessel. Skull: No fracture suspicious osseous lesion. Sinuses/Orbits: Visualized paranasal sinuses and mastoid air cells are clear. Orbits are unremarkable. Other: None. IMPRESSION: 1. No evidence of acute intracranial abnormality. 2. Mild chronic small vessel ischemic disease. Electronically Signed   By: Logan Bores M.D.   On: 03/03/2019 20:08    EKG: Independently reviewed.  Sinus rhythm, LVH, old inferior infarct.  No significant change since prior tracing.  Assessment/Plan Principal Problem:   Aphasia Active Problems:   DM (diabetes mellitus)  ([place])   Essential hypertension   HLD (hyperlipidemia)   UTI (urinary tract infection)   Aphasia/confusion, concern for acute stroke versus TIA Patient presented with complaints of expressive aphasia and confusion.  Symptom onset approximately 5 PM today.  No aphasia, confusion, or focal neuro deficit appreciated at this time.  Head CT negative for acute intracranial abnormality. -Neurology consulted, recommendations pending -Telemetry monitoring -MRI of the brain without contrast ordered.  Further imaging per neurology recommendations. -2D echocardiogram -Hemoglobin A1c, fasting lipid panel -Aspirin 325 p.o. now and daily -Continue home Lipitor 40 mg daily -Frequent neurochecks -PT, OT, speech therapy. -N.p.o. until cleared by bedside swallow evaluation or formal speech evaluation  UTI Patient reports dysuria. UA with moderate amount of leukocytes, 21-50 WBCs, and many bacteria.   -Ceftriaxone -Urine culture pending  Hypertension Blood pressure significantly elevated with EMS but currently normotensive. -Allow permissive hypertension at this time given concern for acute stroke.  Brain MRI pending.  Hyperlipidemia -Continue statin -Lipid panel pending  Insulin-dependent diabetes mellitus Random blood glucose 226. -Check A1c.  Sliding scale insulin sensitive ACHS and CBG checks.  Abnormal LFTs T bili 1.7, AST mildly elevated at 48.  ALT and alk phos normal.  -Right upper quadrant ultrasound  Pharmacy med rec pending.  DVT prophylaxis: Lovenox Code Status: Patient wishes to be full code. Family Communication: No family available at this time. Disposition Plan: Anticipate discharge after clinical improvement. Consults called: Neurology (Dr. Lorraine Lax) Admission status: It is my clinical opinion that referral for OBSERVATION is reasonable and necessary in this patient based on the above information provided. The aforementioned taken together are felt to place the patient at high  risk for further clinical deterioration. However it is anticipated that  the patient may be medically stable for discharge from the hospital within 24 to 48 hours.  The medical decision making on this patient was of high complexity and the patient is at high risk for clinical deterioration, therefore this is a level 3 visit.  Shela Leff MD [facility]  If 7PM-7AM, please contact night-coverage [email or web address] Password [facility]  03/03/2019, 10:29 PM

## 2019-03-04 ENCOUNTER — Observation Stay (HOSPITAL_BASED_OUTPATIENT_CLINIC_OR_DEPARTMENT_OTHER): Payer: Medicare HMO

## 2019-03-04 ENCOUNTER — Observation Stay (HOSPITAL_COMMUNITY): Payer: Medicare HMO

## 2019-03-04 ENCOUNTER — Encounter (HOSPITAL_COMMUNITY): Payer: Self-pay | Admitting: Internal Medicine

## 2019-03-04 ENCOUNTER — Other Ambulatory Visit: Payer: Self-pay | Admitting: Physician Assistant

## 2019-03-04 ENCOUNTER — Other Ambulatory Visit: Payer: Self-pay

## 2019-03-04 DIAGNOSIS — N3000 Acute cystitis without hematuria: Secondary | ICD-10-CM

## 2019-03-04 DIAGNOSIS — I6389 Other cerebral infarction: Secondary | ICD-10-CM | POA: Diagnosis not present

## 2019-03-04 DIAGNOSIS — E119 Type 2 diabetes mellitus without complications: Secondary | ICD-10-CM

## 2019-03-04 DIAGNOSIS — I6523 Occlusion and stenosis of bilateral carotid arteries: Secondary | ICD-10-CM | POA: Diagnosis not present

## 2019-03-04 DIAGNOSIS — I10 Essential (primary) hypertension: Secondary | ICD-10-CM

## 2019-03-04 DIAGNOSIS — E7800 Pure hypercholesterolemia, unspecified: Secondary | ICD-10-CM

## 2019-03-04 DIAGNOSIS — R7989 Other specified abnormal findings of blood chemistry: Secondary | ICD-10-CM

## 2019-03-04 DIAGNOSIS — R4701 Aphasia: Secondary | ICD-10-CM | POA: Diagnosis not present

## 2019-03-04 DIAGNOSIS — G9341 Metabolic encephalopathy: Secondary | ICD-10-CM

## 2019-03-04 DIAGNOSIS — Z794 Long term (current) use of insulin: Secondary | ICD-10-CM

## 2019-03-04 DIAGNOSIS — G43109 Migraine with aura, not intractable, without status migrainosus: Secondary | ICD-10-CM

## 2019-03-04 DIAGNOSIS — H8111 Benign paroxysmal vertigo, right ear: Secondary | ICD-10-CM

## 2019-03-04 DIAGNOSIS — G459 Transient cerebral ischemic attack, unspecified: Secondary | ICD-10-CM

## 2019-03-04 LAB — HEMOGLOBIN A1C
Hgb A1c MFr Bld: 7.9 % — ABNORMAL HIGH (ref 4.8–5.6)
Mean Plasma Glucose: 180.03 mg/dL

## 2019-03-04 LAB — GLUCOSE, CAPILLARY
Glucose-Capillary: 116 mg/dL — ABNORMAL HIGH (ref 70–99)
Glucose-Capillary: 94 mg/dL (ref 70–99)
Glucose-Capillary: 208 mg/dL — ABNORMAL HIGH (ref 70–99)

## 2019-03-04 LAB — LIPID PANEL
Cholesterol: 111 mg/dL (ref 0–200)
HDL: 37 mg/dL — ABNORMAL LOW (ref >40)
LDL Cholesterol: 52 mg/dL (ref 0–99)
Total CHOL/HDL Ratio: 3 RATIO
Triglycerides: 108 mg/dL (ref <150)
VLDL: 22 mg/dL (ref 0–40)

## 2019-03-04 LAB — SARS CORONAVIRUS 2 (TAT 6-24 HRS): SARS Coronavirus 2: NEGATIVE

## 2019-03-04 LAB — ECHOCARDIOGRAM COMPLETE: Weight: 2895.96 oz

## 2019-03-04 MED ORDER — CLOPIDOGREL BISULFATE 75 MG PO TABS: 75.0000 mg | ORAL_TABLET | Freq: Every day | ORAL | 0 refills | Status: AC

## 2019-03-04 MED ORDER — IOHEXOL 350 MG/ML SOLN
75.0000 mL | Freq: Once | INTRAVENOUS | Status: AC | PRN
  Administered 2019-03-04: 75 mL via INTRAVENOUS

## 2019-03-04 MED ORDER — AMOXICILLIN 500 MG PO CAPS: 500.0000 mg | ORAL_CAPSULE | Freq: Two times a day (BID) | ORAL | 0 refills | Status: AC

## 2019-03-04 MED ORDER — ASPIRIN EC 81 MG PO TBEC
81.0000 mg | DELAYED_RELEASE_TABLET | Freq: Every day | ORAL | Status: DC
  Administered 2019-03-04: 81 mg via ORAL
  Filled 2019-03-04: qty 1

## 2019-03-04 MED ORDER — ASPIRIN 81 MG PO TBEC: 81.0000 mg | DELAYED_RELEASE_TABLET | Freq: Every day | ORAL | 0 refills | Status: AC

## 2019-03-04 MED ORDER — CLOPIDOGREL BISULFATE 75 MG PO TABS
75.0000 mg | ORAL_TABLET | Freq: Every day | ORAL | Status: DC
  Administered 2019-03-04: 75 mg via ORAL
  Filled 2019-03-04: qty 1

## 2019-03-04 NOTE — Progress Notes (Signed)
SLP Cancellation Note  Patient Details Name: Valerie Wood MRN: [id number] DOB: July 22, 1945   Cancelled treatment:       Reason Eval/Treat Not Completed: Patient at procedure or test/unavailable  Will re-attempt later this date as able.  Marina Goodell, M.Ed., CCC-SLP Speech Therapy Acute Rehabilitation [phone]: Acute Rehab office [phone] - pager     03/04/2019, 10:20 AM

## 2019-03-04 NOTE — Discharge Summary (Addendum)
Physician Discharge Summary  [name]:[id number] DOB: [birth year] DOA: 03/03/2019  PCP: Mayra Neer, MD  Admit date: 03/03/2019 Discharge date: 03/04/2019  Admitted From: Home Disposition: Home  Recommendations for Outpatient Follow-up:  1. Follow up with PCP in 1-2 weeks 2. Please obtain BMP/CBC in one week 3. Please follow up on the following pending results:  Home Health: None Equipment/Devices: None  Discharge Condition: Stable CODE STATUS: Full code Diet recommendation: Cardiac  Subjective: Seen and examined.  She has no complaints.  No residual symptoms.  HPI: Valerie Wood is a 74 y.o. female with medical history significant of COPD, insulin-dependent diabetes mellitus, fibromyalgia, GERD, gastroparesis, atrophic gastritis, esophagitis, hypertension, hyperlipidemia, and conditions listed below presenting to the ED via EMS for evaluation of altered mental status and aphasia.  Patient states around 5 PM today she started having a headache.  While talking to her daughter on the phone she had difficulty getting words out and was confused.  She also had difficulty entering her password into the phone as she was confused.  Denies any weakness or numbness in her upper or lower extremities.  Denies facial droop.  Denies history of prior stroke.  States her symptoms resolved by the time she reached the hospital.  States she was in her usual state of health until this episode today.  No recent fevers, chills, cough, or shortness of breath.  Does report dysuria.   Blood pressure significantly elevated at 220/120 with EMS.  CBG 160.  ED Course: Blood pressure 143/74 on arrival and remainder of vital signs stable.  CBC unremarkable.  Blood glucose 226.  T bili 1.7, AST mildly elevated at 48.  ALT and alk phos normal.  Blood ethanol level undetectable.  UDS negative.  UA with moderate amount of leukocytes, 21-50 WBCs, and many bacteria.  Urine culture pending.  Head CT negative for  acute intracranial abnormality.  ED provider discussed the case with neurology, recommended admission for stroke work-up.  Neurology will consult.  Brief/Interim Summary: Patient was admitted with strokelike symptoms and for further work-up of stroke.  She underwent CT head, MRI brain as well as CT angiogram of head and neck and all of those studies were unremarkable.  Patient was also diagnosed with UTI for which she was started on Rocephin.  Urine culture negative thus far.  Patient has no residual symptoms.  She was seen by PT OT and she did well and they did not recommend any home health either.  Patient is agreeable with discharge plan so she is going to be discharged home in stable condition.  I have prescribed her amoxicillin for 5 days to treat her UTI.  Neurology also recommended for her to go on aspirin and Plavix for 3 weeks and then aspirin alone after that.  They also recommended event monitor for 30 days for the patient which I have relayed to the cardiology and they will arrange this.  Discharge Diagnoses:  Principal Problem:   Aphasia Active Problems:   DM (diabetes mellitus) ([place])   Essential hypertension   HLD (hyperlipidemia)   UTI (urinary tract infection)   Acute metabolic encephalopathy    Discharge Instructions  Discharge Instructions    Discharge patient   Complete by: As directed    Discharge disposition: 01-Home or Self Care   Discharge patient date: 03/04/2019     Allergies as of 03/04/2019      Reactions   Canagliflozin Itching, Nausea And Vomiting   Yeast infection   Macrolides  And Ketolides Nausea And Vomiting   Morphine And Related Nausea And Vomiting   Tetracyclines & Related Itching, Nausea And Vomiting   Tramadol Nausea And Vomiting   Doxycycline Hyclate    Stomach upset   Gabapentin    Felt bad   Glipizide Diarrhea   Iloperidone Dermatitis   Januvia [sitagliptin] Nausea And Vomiting   Macrobid [nitrofurantoin Macrocrystal] Nausea And Vomiting    Metformin    Nitrofurantoin    n/v   Pantoprazole Nausea And Vomiting   Symbicort [budesonide-formoterol Fumarate]    Thrush   Victoza [liraglutide] Nausea And Vomiting   Ciprofloxacin Hcl Rash      Medication List    TAKE these medications   Accu-Chek FastClix Lancets Misc   Accu-Chek SmartView test strip Generic drug: glucose blood   amoxicillin 500 MG capsule Commonly known as: AMOXIL Take 1 capsule (500 mg total) by mouth 2 (two) times daily for 5 days.   aspirin 81 MG EC tablet Take 1 tablet (81 mg total) by mouth daily. Start taking on: March 05, 2019   atorvastatin 40 MG tablet Commonly known as: LIPITOR Take 40 mg by mouth at bedtime.   BD Veo Insulin Syringe U/F 31G X 15/64" 0.5 ML Misc Generic drug: Insulin Syringe-Needle U-100   Biotin 5 MG Tabs Take 5 mg by mouth at bedtime.   celecoxib 200 MG capsule Commonly known as: CELEBREX Take 1 capsule (200 mg total) by mouth 2 (two) times daily. What changed: when to take this   cetirizine 10 MG tablet Commonly known as: ZYRTEC Take 10 mg by mouth at bedtime.   cholecalciferol 1000 units tablet Commonly known as: VITAMIN D Take 1,000 Units by mouth at bedtime.   clopidogrel 75 MG tablet Commonly known as: PLAVIX Take 1 tablet (75 mg total) by mouth daily for 21 days. Start taking on: March 05, 2019   fluticasone 50 MCG/ACT nasal spray Commonly known as: FLONASE Place 2 sprays into both nostrils daily as needed for allergies or rhinitis.   HYDROcodone-acetaminophen 5-325 MG tablet Commonly known as: NORCO/VICODIN Take 1-2 tablets by mouth every 4 (four) hours as needed for moderate pain.   insulin NPH-regular Human (70-30) 100 UNIT/ML injection Inject 40-50 Units into the skin 2 (two) times daily. Inject 40 units subcutaneously in the morning and 50 units at supper   losartan 50 MG tablet Commonly known as: COZAAR Take 50 mg by mouth at bedtime.   multivitamin with minerals Tabs  tablet Take 1 tablet by mouth at bedtime.   omeprazole 20 MG capsule Commonly known as: PRILOSEC Take 20 mg by mouth at bedtime.   ReliOn Pen Needles 32G X 4 MM Misc Generic drug: Insulin Pen Needle   Symbicort 80-4.5 MCG/ACT inhaler Generic drug: budesonide-formoterol Inhale 2 puffs into the lungs daily.      Follow-up Information    Mayra Neer, MD Follow up in 1 week(s).   Specialty: Family Medicine Contact information: [address] [place] [address] [phone]          Allergies  Allergen Reactions  . Canagliflozin Itching and Nausea And Vomiting    Yeast infection  . Macrolides And Ketolides Nausea And Vomiting  . Morphine And Related Nausea And Vomiting  . Tetracyclines & Related Itching and Nausea And Vomiting  . Tramadol Nausea And Vomiting  . Doxycycline Hyclate     Stomach upset  . Gabapentin     Felt bad  . Glipizide Diarrhea  . Iloperidone  Dermatitis  . Januvia [Sitagliptin] Nausea And Vomiting  . Macrobid [Nitrofurantoin Macrocrystal] Nausea And Vomiting  . Metformin   . Nitrofurantoin     n/v  . Pantoprazole Nausea And Vomiting  . Symbicort [Budesonide-Formoterol Fumarate]     Thrush   . Victoza [Liraglutide] Nausea And Vomiting  . Ciprofloxacin Hcl Rash    Consultations: Neurology   Procedures/Studies: CT ANGIO HEAD W OR WO CONTRAST  Result Date: 03/04/2019 CLINICAL DATA:  Transient ischemic attack (TIA). Additional history obtained from [place] presented to the emergency department with aphasia. EXAM: CT ANGIOGRAPHY HEAD AND NECK TECHNIQUE: Multidetector CT imaging of the head and neck was performed using the standard protocol during bolus administration of intravenous contrast. Multiplanar CT image reconstructions and MIPs were obtained to evaluate the vascular anatomy. Carotid stenosis measurements (when applicable) are obtained utilizing NASCET criteria, using the distal internal  carotid diameter as the denominator. CONTRAST:  52m OMNIPAQUE IOHEXOL 350 MG/ML SOLN COMPARISON:  Brain MRI 03/03/2019, FINDINGS: CTA NECK FINDINGS Aortic arch: Standard aortic branching. Mild mixed plaque within the visualized aortic arch and proximal major branch vessels of the neck. No significant innominate or proximal subclavian stenosis. Right carotid system: CCA and ICA patent within the neck without stenosis. Minimal mixed plaque within the carotid bifurcation. Left carotid system: CCA and ICA patent within the neck without stenosis. Vertebral arteries: Codominant. The vertebral arteries are patent within the neck bilaterally without stenosis. Skeleton: No acute bony abnormality. Grade 1 anterolisthesis at the C2-C3 through C5-C6 levels. Other neck: No soft tissue neck mass or cervical lymphadenopathy. Upper chest: No consolidation within the imaged lung apices. Review of the MIP images confirms the above findings CTA HEAD FINDINGS Anterior circulation: The intracranial internal carotid arteries are patent bilaterally. Calcified plaque within these vessels. Mild-to-moderate stenosis within the distal cavernous/paraclinoid right ICA. No significant stenosis within the intracranial left ICA. The M1 right middle cerebral artery is patent without significant stenosis. No right M2 proximal branch occlusion or high-grade proximal stenosis is identified. The right anterior cerebral artery is patent without significant proximal stenosis. The M1 left middle cerebral artery is patent without significant stenosis. No left M2 proximal branch occlusion or high-grade proximal stenosis is identified. The left anterior cerebral artery is patent without significant proximal stenosis. No intracranial aneurysm is identified. Posterior circulation: The intracranial vertebral arteries are patent without significant stenosis, as is the basilar artery. The bilateral posterior cerebral arteries are patent without significant  proximal stenosis. Posterior communicating arteries are poorly delineated and may be hypoplastic or absent bilaterally. Venous sinuses: Within limitations of contrast timing, no convincing thrombus. Anatomic variants: As described Review of the MIP images confirms the above findings IMPRESSION: CTA neck: The common carotid, internal carotid and vertebral arteries are patent within the neck without significant stenosis. Minimal plaque within the right carotid bifurcation. CTA head: 1. No intracranial large vessel occlusion or proximal high-grade arterial stenosis. 2. Calcified plaque within the intracranial internal carotid arteries bilaterally. Resultant mild/moderate narrowing of the distal cavernous/paraclinoid right ICA. Electronically Signed   By: KKellie SimmeringDO   On: 03/04/2019 10:41   CT HEAD WO CONTRAST  Result Date: 03/03/2019 CLINICAL DATA:  Expressive aphasia. EXAM: CT HEAD WITHOUT CONTRAST TECHNIQUE: Contiguous axial images were obtained from the base of the skull through the vertex without intravenous contrast. COMPARISON:  12/19/2013 FINDINGS: Brain: No acute cortically based infarct, intracranial hemorrhage, mass, midline shift, or extra-axial fluid collection is identified. Hypodensities in the cerebral white matter bilaterally have  progressed from the prior CT and are nonspecific but compatible with chronic small vessel ischemic disease, mildly advanced for age. Mild cerebral atrophy is within normal limits for age. Vascular: Calcified atherosclerosis at the skull base. No hyperdense vessel. Skull: No fracture suspicious osseous lesion. Sinuses/Orbits: Visualized paranasal sinuses and mastoid air cells are clear. Orbits are unremarkable. Other: None. IMPRESSION: 1. No evidence of acute intracranial abnormality. 2. Mild chronic small vessel ischemic disease. Electronically Signed   By: Logan Bores M.D.   On: 03/03/2019 20:08   CT ANGIO NECK W OR WO CONTRAST  Result Date: 03/04/2019 CLINICAL  DATA:  Transient ischemic attack (TIA). Additional history obtained from [place] presented to the emergency department with aphasia. EXAM: CT ANGIOGRAPHY HEAD AND NECK TECHNIQUE: Multidetector CT imaging of the head and neck was performed using the standard protocol during bolus administration of intravenous contrast. Multiplanar CT image reconstructions and MIPs were obtained to evaluate the vascular anatomy. Carotid stenosis measurements (when applicable) are obtained utilizing NASCET criteria, using the distal internal carotid diameter as the denominator. CONTRAST:  26m OMNIPAQUE IOHEXOL 350 MG/ML SOLN COMPARISON:  Brain MRI 03/03/2019, FINDINGS: CTA NECK FINDINGS Aortic arch: Standard aortic branching. Mild mixed plaque within the visualized aortic arch and proximal major branch vessels of the neck. No significant innominate or proximal subclavian stenosis. Right carotid system: CCA and ICA patent within the neck without stenosis. Minimal mixed plaque within the carotid bifurcation. Left carotid system: CCA and ICA patent within the neck without stenosis. Vertebral arteries: Codominant. The vertebral arteries are patent within the neck bilaterally without stenosis. Skeleton: No acute bony abnormality. Grade 1 anterolisthesis at the C2-C3 through C5-C6 levels. Other neck: No soft tissue neck mass or cervical lymphadenopathy. Upper chest: No consolidation within the imaged lung apices. Review of the MIP images confirms the above findings CTA HEAD FINDINGS Anterior circulation: The intracranial internal carotid arteries are patent bilaterally. Calcified plaque within these vessels. Mild-to-moderate stenosis within the distal cavernous/paraclinoid right ICA. No significant stenosis within the intracranial left ICA. The M1 right middle cerebral artery is patent without significant stenosis. No right M2 proximal branch occlusion or high-grade proximal stenosis is identified. The right  anterior cerebral artery is patent without significant proximal stenosis. The M1 left middle cerebral artery is patent without significant stenosis. No left M2 proximal branch occlusion or high-grade proximal stenosis is identified. The left anterior cerebral artery is patent without significant proximal stenosis. No intracranial aneurysm is identified. Posterior circulation: The intracranial vertebral arteries are patent without significant stenosis, as is the basilar artery. The bilateral posterior cerebral arteries are patent without significant proximal stenosis. Posterior communicating arteries are poorly delineated and may be hypoplastic or absent bilaterally. Venous sinuses: Within limitations of contrast timing, no convincing thrombus. Anatomic variants: As described Review of the MIP images confirms the above findings IMPRESSION: CTA neck: The common carotid, internal carotid and vertebral arteries are patent within the neck without significant stenosis. Minimal plaque within the right carotid bifurcation. CTA head: 1. No intracranial large vessel occlusion or proximal high-grade arterial stenosis. 2. Calcified plaque within the intracranial internal carotid arteries bilaterally. Resultant mild/moderate narrowing of the distal cavernous/paraclinoid right ICA. Electronically Signed   By: KKellie SimmeringDO   On: 03/04/2019 10:41   MR BRAIN WO CONTRAST  Result Date: 03/03/2019 CLINICAL DATA:  Initial evaluation for acute altered mental status, aphasia, headache. EXAM: MRI HEAD WITHOUT CONTRAST TECHNIQUE: Multiplanar, multiecho pulse sequences of the brain and surrounding structures were obtained without  intravenous contrast. COMPARISON:  Prior CT from earlier the same day. FINDINGS: Brain: Cerebral volume within normal limits for patient age. Patchy T2/FLAIR hyperintensity seen within the periventricular and deep white matter of both cerebral hemispheres, nonspecific, but most like related chronic  microvascular ischemic disease, mild for age. No abnormal foci of restricted diffusion to suggest acute or subacute ischemia. Gray-white matter differentiation well maintained. No encephalomalacia to suggest chronic infarction. No foci of susceptibility artifact to suggest acute or chronic intracranial hemorrhage. No mass lesion, midline shift or mass effect. No hydrocephalus. No extra-axial fluid collection. Major dural sinuses are grossly patent. Pituitary gland and suprasellar region are normal. Midline structures intact and normal. Vascular: Major intracranial vascular flow voids well maintained and normal in appearance. Skull and upper cervical spine: Craniocervical junction normal. Visualized upper cervical spine within normal limits. Bone marrow signal intensity normal. Small lipoma noted at the right frontal scalp. Scalp soft tissues otherwise unremarkable. Sinuses/Orbits: Globes and orbital soft tissues within normal limits. Paranasal sinuses are clear. No mastoid effusion. Inner ear structures normal. Other: None. IMPRESSION: 1. No acute intracranial abnormality. 2. Mild chronic microvascular ischemic disease for age. Electronically Signed   By: Jeannine Boga M.D.   On: 03/03/2019 23:51   ECHOCARDIOGRAM COMPLETE  Result Date: 03/04/2019   ECHOCARDIOGRAM REPORT   Patient Name:   Valerie Wood Date of Exam: 03/04/2019 Medical Rec #:  [id number]     Height:       63.0 in Accession #:    [id number]    Weight:       181.0 lb Date of Birth:  1945-04-21     BSA:          1.85 m Patient Age:    33 years      BP:           135/75 mmHg Patient Gender: F             HR:           77 bpm. Exam Location:  Inpatient Procedure: 2D Echo Indications:    Stroke 434.91 / I163.9  History:        Patient has no prior history of Echocardiogram examinations.                 NSTEMI and CAD, Signs/Symptoms:Chest Pain; Risk                 Factors:Hypertension, Diabetes and Dyslipidemia.  Sonographer:    Vikki Ports Turrentine  Referring Phys: [id number] [place]  1. Left ventricular ejection fraction, by visual estimation, is 50 to 55%. The left ventricle has low normal function. There is no left ventricular hypertrophy.  2. Mild hypokinesis of the left ventricular, basal-mid inferior wall.  3. Left ventricular diastolic parameters are consistent with Grade I diastolic dysfunction (impaired relaxation).  4. The left ventricle demonstrates regional wall motion abnormalities.  5. Global right ventricle has normal systolic function.The right ventricular size is normal. No increase in right ventricular wall thickness.  6. Left atrial size was normal.  7. Right atrial size was normal.  8. The mitral valve is normal in structure. No evidence of mitral valve regurgitation. No evidence of mitral stenosis.  9. The tricuspid valve is normal in structure. 10. The tricuspid valve is normal in structure. Tricuspid valve regurgitation is not demonstrated. 11. The aortic valve is normal in structure. Aortic valve regurgitation is not visualized. No evidence of aortic valve sclerosis or stenosis. 12. The pulmonic valve  was normal in structure. Pulmonic valve regurgitation is not visualized. 13. Normal pulmonary artery systolic pressure. 14. The inferior vena cava is normal in size with greater than 50% respiratory variability, suggesting right atrial pressure of 3 mmHg. FINDINGS  Left Ventricle: Left ventricular ejection fraction, by visual estimation, is 50 to 55%. The left ventricle has low normal function. Mild hypokinesis of the left ventricular, basal-mid inferior wall. The left ventricle demonstrates regional wall motion abnormalities. There is no left ventricular hypertrophy. Left ventricular diastolic parameters are consistent with Grade I diastolic dysfunction (impaired relaxation). Normal left atrial pressure. Right Ventricle: The right ventricular size is normal. No increase in right ventricular wall thickness. Global RV  systolic function is has normal systolic function. The tricuspid regurgitant velocity is 2.08 m/s, and with an assumed right atrial pressure  of 3 mmHg, the estimated right ventricular systolic pressure is normal at 20.3 mmHg. Left Atrium: Left atrial size was normal in size. Right Atrium: Right atrial size was normal in size Pericardium: There is no evidence of pericardial effusion. Mitral Valve: The mitral valve is normal in structure. No evidence of mitral valve regurgitation. No evidence of mitral valve stenosis by observation. Tricuspid Valve: The tricuspid valve is normal in structure. Tricuspid valve regurgitation is not demonstrated. Aortic Valve: The aortic valve is normal in structure. Aortic valve regurgitation is not visualized. The aortic valve is structurally normal, with no evidence of sclerosis or stenosis. Pulmonic Valve: The pulmonic valve was normal in structure. Pulmonic valve regurgitation is not visualized. Pulmonic regurgitation is not visualized. Aorta: The aortic root, ascending aorta and aortic arch are all structurally normal, with no evidence of dilitation or obstruction. Venous: The inferior vena cava is normal in size with greater than 50% respiratory variability, suggesting right atrial pressure of 3 mmHg. IAS/Shunts: No atrial level shunt detected by color flow Doppler. There is no evidence of a patent foramen ovale. No ventricular septal defect is seen or detected. There is no evidence of an atrial septal defect.  LEFT VENTRICLE PLAX 2D LVIDd:         4.90 cm  Diastology LVIDs:         3.10 cm  LV e' lateral:   7.07 cm/s LV PW:         1.10 cm  LV E/e' lateral: 8.1 LV IVS:        1.10 cm  LV e' medial:    5.77 cm/s LVOT diam:     2.00 cm  LV E/e' medial:  9.9 LV SV:         75 ml LV SV Index:   38.57 LVOT Area:     3.14 cm  RIGHT VENTRICLE RV S prime:     9.57 cm/s TAPSE (M-mode): 1.2 cm LEFT ATRIUM             Index       RIGHT ATRIUM           Index LA diam:        4.30 cm 2.32  cm/m  RA Area:     13.90 cm LA Vol (A2C):   45.1 ml 24.33 ml/m RA Volume:   31.80 ml  17.16 ml/m LA Vol (A4C):   46.8 ml 25.25 ml/m LA Biplane Vol: 46.0 ml 24.82 ml/m  AORTIC VALVE LVOT Vmax:   96.60 cm/s LVOT Vmean:  66.700 cm/s LVOT VTI:    0.183 m  AORTA Ao Root diam: 3.10 cm MITRAL VALVE  TRICUSPID VALVE MV Area (PHT): 3.63 cm              TR Peak grad:   17.3 mmHg MV PHT:        60.61 msec            TR Vmax:        208.00 cm/s MV Decel Time: 209 msec MV E velocity: 57.00 cm/s  103 cm/s  SHUNTS MV A velocity: 102.00 cm/s 70.3 cm/s Systemic VTI:  0.18 m MV E/A ratio:  0.56        1.5       Systemic Diam: 2.00 cm  Dani Gobble Croitoru MD Electronically signed by Sanda Klein MD Signature Date/Time: 03/04/2019/10:08:50 AM    Final    US Abdomen Limited RUQ  Result Date: 03/03/2019 CLINICAL DATA:  74 year old female with elevated liver enzymes. Prior cholecystectomy. EXAM: ULTRASOUND ABDOMEN LIMITED RIGHT UPPER QUADRANT COMPARISON:  CT Abdomen and Pelvis 04/30/2015. FINDINGS: Gallbladder: Surgically absent. Common bile duct: Diameter: 8-9 millimeters diameter, stable compared to the 2017 CT. No intraductal filling defect identified. Liver: Echogenic liver (image 13). No discrete liver lesion. No intrahepatic biliary ductal dilatation identified. Portal vein is patent on color Doppler imaging with normal direction of blood flow towards the liver. Other: Negative visible right kidney. IMPRESSION: 1. Evidence of hepatic steatosis. 2. Chronic cholecystectomy with stable mild postoperative CBD enlargement since 2017. Electronically Signed   By: Genevie Ann M.D.   On: 03/03/2019 23:52     Discharge Exam: Vitals:   03/04/19 0513 03/04/19 0801  BP: 116/66 135/75  Pulse: 70 77  Resp: 16 15  Temp: 97.8 F (36.6 C) 98.7 F (37.1 C)  SpO2: 94% 93%   Vitals:   03/04/19 0113 03/04/19 0331 03/04/19 0513 03/04/19 0801  BP: 121/63 (!) 111/57 116/66 135/75  Pulse: 84 71 70 77  Resp: '17 17  16 15  ' Temp: 98.3 F (36.8 C) 97.7 F (36.5 C) 97.8 F (36.6 C) 98.7 F (37.1 C)  TempSrc: Oral Oral Oral   SpO2: 96% 93% 94% 93%  Weight: 82.1 kg       General: Pt is alert, awake, not in acute distress Cardiovascular: RRR, S1/S2 +, no rubs, no gallops Respiratory: CTA bilaterally, no wheezing, no rhonchi Abdominal: Soft, NT, ND, bowel sounds + Extremities: no edema, no cyanosis    The results of significant diagnostics from this hospitalization (including imaging, microbiology, ancillary and laboratory) are listed below for reference.     Microbiology: Recent Results (from the past 240 hour(s))  SARS CORONAVIRUS 2 (TAT 6-24 HRS) Nasopharyngeal Nasopharyngeal Swab     Status: None   Collection Time: 03/03/19 11:54 PM   Specimen: Nasopharyngeal Swab  Result Value Ref Range Status   SARS Coronavirus 2 NEGATIVE NEGATIVE Final    Comment: (NOTE) SARS-CoV-2 target nucleic acids are NOT DETECTED. The SARS-CoV-2 RNA is generally detectable in upper and lower respiratory specimens during the acute phase of infection. Negative results do not preclude SARS-CoV-2 infection, do not rule out co-infections with other pathogens, and should not be used as the sole basis for treatment or other patient management decisions. Negative results must be combined with clinical observations, patient history, and epidemiological information. The expected result is Negative. Fact Sheet for Patients: SugarRoll.be Fact Sheet for Healthcare Providers: https://www.woods-mathews.com/ This test is not yet approved or cleared by the [place] FDA and  has been authorized for detection and/or diagnosis of SARS-CoV-2 by FDA under an Emergency Use Authorization (EUA).  This EUA will remain  in effect (meaning this test can be used) for the duration of the COVID-19 declaration under Section 56 4(b)(1) of the Act, 21 U.S.C. section 360bbb-3(b)(1), unless the  authorization is terminated or revoked sooner. Performed at [facility], [place] [address], [place], [place] [address]      Labs: BNP (last 3 results) No results for input(s): BNP in the last 8760 hours. Basic Metabolic Panel: Recent Labs  Lab 03/03/19 1916  NA 137  K 4.9  CL 103  CO2 23  GLUCOSE 226*  BUN 18  CREATININE 0.86  CALCIUM 9.8   Liver Function Tests: Recent Labs  Lab 03/03/19 1916  AST 48*  ALT 24  ALKPHOS 60  BILITOT 1.7*  PROT 6.7  ALBUMIN 4.0   No results for input(s): LIPASE, AMYLASE in the last 168 hours. No results for input(s): AMMONIA in the last 168 hours. CBC: Recent Labs  Lab 03/03/19 1916  WBC 7.3  NEUTROABS 4.5  HGB 15.0  HCT 44.5  MCV 91.6  PLT 231   Cardiac Enzymes: No results for input(s): CKTOTAL, CKMB, CKMBINDEX, TROPONINI in the last 168 hours. BNP: Invalid input(s): POCBNP CBG: Recent Labs  Lab 03/03/19 2339 03/04/19 0117 03/04/19 0618 03/04/19 1229  GLUCAP 138* 116* 94 208*   D-Dimer No results for input(s): DDIMER in the last 72 hours. Hgb A1c Recent Labs    03/04/19 0227  HGBA1C 7.9*   Lipid Profile Recent Labs    03/04/19 0227  CHOL 111  HDL 37*  LDLCALC 52  TRIG 108  CHOLHDL 3.0   Thyroid function studies No results for input(s): TSH, T4TOTAL, T3FREE, THYROIDAB in the last 72 hours.  Invalid input(s): FREET3 Anemia work up No results for input(s): VITAMINB12, FOLATE, FERRITIN, TIBC, IRON, RETICCTPCT in the last 72 hours. Urinalysis    Component Value Date/Time   COLORURINE YELLOW 03/03/2019 1933   APPEARANCEUR CLEAR 03/03/2019 1933   LABSPEC 1.006 03/03/2019 1933   PHURINE 6.0 03/03/2019 1933   GLUCOSEU NEGATIVE 03/03/2019 [place] NEGATIVE 03/03/2019 [place] 03/03/2019 [place] NEGATIVE 03/03/2019 1933   PROTEINUR NEGATIVE 03/03/2019 1933   UROBILINOGEN 1.0 12/23/2013 1041   NITRITE NEGATIVE 03/03/2019 1933   LEUKOCYTESUR MODERATE (A) 03/03/2019  1933   Sepsis Labs Invalid input(s): PROCALCITONIN,  WBC,  LACTICIDVEN Microbiology Recent Results (from the past 240 hour(s))  SARS CORONAVIRUS 2 (TAT 6-24 HRS) Nasopharyngeal Nasopharyngeal Swab     Status: None   Collection Time: 03/03/19 11:54 PM   Specimen: Nasopharyngeal Swab  Result Value Ref Range Status   SARS Coronavirus 2 NEGATIVE NEGATIVE Final    Comment: (NOTE) SARS-CoV-2 target nucleic acids are NOT DETECTED. The SARS-CoV-2 RNA is generally detectable in upper and lower respiratory specimens during the acute phase of infection. Negative results do not preclude SARS-CoV-2 infection, do not rule out co-infections with other pathogens, and should not be used as the sole basis for treatment or other patient management decisions. Negative results must be combined with clinical observations, patient history, and epidemiological information. The expected result is Negative. Fact Sheet for Patients: SugarRoll.be Fact Sheet for Healthcare Providers: https://www.woods-mathews.com/ This test is not yet approved or cleared by the [place] FDA and  has been authorized for detection and/or diagnosis of SARS-CoV-2 by FDA under an Emergency Use Authorization (EUA). This EUA will remain  in effect (meaning this test can be used) for the duration of the COVID-19 declaration under Section  56 4(b)(1) of the Act, 21 U.S.C. section 360bbb-3(b)(1), unless the authorization is terminated or revoked sooner. Performed at [facility], [place] [address], [place], [place] [address]      Time coordinating discharge: Over 30 minutes  SIGNED:   Darliss Cheney, MD  [facility] 03/04/2019, 12:49 PM  If 7PM-7AM, please contact night-coverage [email or web address] Password [email or web address]

## 2019-03-04 NOTE — Discharge Instructions (Signed)
Urinary Tract Infection, Adult A urinary tract infection (UTI) is an infection of any part of the urinary tract. The urinary tract includes:  The kidneys.  The ureters.  The bladder.  The urethra. These organs make, store, and get rid of pee (urine) in the body. What are the causes? This is caused by germs (bacteria) in your genital area. These germs grow and cause swelling (inflammation) of your urinary tract. What increases the risk? You are more likely to develop this condition if:  You have a small, thin tube (catheter) to drain pee.  You cannot control when you pee or poop (incontinence).  You are female, and: ? You use these methods to prevent pregnancy:  A medicine that kills sperm (spermicide).  A device that blocks sperm (diaphragm). ? You have low levels of a female hormone (estrogen). ? You are pregnant.  You have genes that add to your risk.  You are sexually active.  You take antibiotic medicines.  You have trouble peeing because of: ? A prostate that is bigger than normal, if you are female. ? A blockage in the part of your body that drains pee from the bladder (urethra). ? A kidney stone. ? A nerve condition that affects your bladder (neurogenic bladder). ? Not getting enough to drink. ? Not peeing often enough.  You have other conditions, such as: ? Diabetes. ? A weak disease-fighting system (immune system). ? Sickle cell disease. ? Gout. ? Injury of the spine. What are the signs or symptoms? Symptoms of this condition include:  Needing to pee right away (urgently).  Peeing often.  Peeing small amounts often.  Pain or burning when peeing.  Blood in the pee.  Pee that smells bad or not like normal.  Trouble peeing.  Pee that is cloudy.  Fluid coming from the vagina, if you are female.  Pain in the belly or lower back. Other symptoms include:  Throwing up (vomiting).  No urge to eat.  Feeling mixed up (confused).  Being tired  and grouchy (irritable).  A fever.  Watery poop (diarrhea). How is this treated? This condition may be treated with:  Antibiotic medicine.  Other medicines.  Drinking enough water. Follow these instructions at home:  Medicines  Take over-the-counter and prescription medicines only as told by your doctor.  If you were prescribed an antibiotic medicine, take it as told by your doctor. Do not stop taking it even if you start to feel better. General instructions  Make sure you: ? Pee until your bladder is empty. ? Do not hold pee for a long time. ? Empty your bladder after sex. ? Wipe from front to back after pooping if you are a female. Use each tissue one time when you wipe.  Drink enough fluid to keep your pee pale yellow.  Keep all follow-up visits as told by your doctor. This is important. Contact a doctor if:  You do not get better after 1-2 days.  Your symptoms go away and then come back. Get help right away if:  You have very bad back pain.  You have very bad pain in your lower belly.  You have a fever.  You are sick to your stomach (nauseous).  You are throwing up. Summary  A urinary tract infection (UTI) is an infection of any part of the urinary tract.  This condition is caused by germs in your genital area.  There are many risk factors for a UTI. These include having a small, thin   tube to drain pee and not being able to control when you pee or poop.  Treatment includes antibiotic medicines for germs.  Drink enough fluid to keep your pee pale yellow. This information is not intended to replace advice given to you by your health care provider. Make sure you discuss any questions you have with your health care provider. Document Revised: 01/10/2018 Document Reviewed: 08/02/2017 Elsevier Patient Education  2020 [employer]  

## 2019-03-04 NOTE — Progress Notes (Signed)
STROKE TEAM PROGRESS NOTE   INTERVAL HISTORY Pt sitting at the edge of bed and recounted HPI with me. She stated that she had a fall from 3rd floor to ground accidentally few years ago. Since then she had migraine with visual aura, yesterday again she had migraine but after eating she felt better. Then she had episode of aphasia during phone call with her daughter. Now all resolved. Her BP was high checked with EMS  She also had BPPV since the fall. She had had Epley maneuver 17 times. Today she had another one when she laid down for CT. She also has difficulty turning in bed at night from time to time due to vertigo.   Vitals:   03/04/19 0113 03/04/19 0331 03/04/19 0513 03/04/19 0801  BP: 121/63 (!) 111/57 116/66 135/75  Pulse: 84 71 70 77  Resp: 17 17 16 15   Temp: 98.3 F (36.8 C) 97.7 F (36.5 C) 97.8 F (36.6 C) 98.7 F (37.1 C)  TempSrc: Oral Oral Oral   SpO2: 96% 93% 94% 93%  Weight: 82.1 kg       CBC:  Recent Labs  Lab 03/03/19 1916  WBC 7.3  NEUTROABS 4.5  HGB 15.0  HCT 44.5  MCV 91.6  PLT [id number]    Basic Metabolic Panel:  Recent Labs  Lab 03/03/19 1916  NA 137  K 4.9  CL 103  CO2 23  GLUCOSE 226*  BUN 18  CREATININE 0.86  CALCIUM 9.8   Lipid Panel:     Component Value Date/Time   CHOL 111 03/04/2019 0227   TRIG 108 03/04/2019 0227   HDL 37 (L) 03/04/2019 0227   CHOLHDL 3.0 03/04/2019 0227   VLDL 22 03/04/2019 0227   LDLCALC 52 03/04/2019 0227   HgbA1c:  Lab Results  Component Value Date   HGBA1C 7.9 (H) 03/04/2019   Urine Drug Screen:     Component Value Date/Time   LABOPIA NONE DETECTED 03/03/2019 1933   COCAINSCRNUR NONE DETECTED 03/03/2019 1933   LABBENZ NONE DETECTED 03/03/2019 1933   AMPHETMU NONE DETECTED 03/03/2019 1933   THCU NONE DETECTED 03/03/2019 1933   LABBARB NONE DETECTED 03/03/2019 1933    Alcohol Level     Component Value Date/Time   ETH <10 03/03/2019 1916    IMAGING past 48 hours CT ANGIO HEAD W OR WO  CONTRAST  Result Date: 03/04/2019 CLINICAL DATA:  Transient ischemic attack (TIA). Additional history obtained from [place] presented to the emergency department with aphasia. EXAM: CT ANGIOGRAPHY HEAD AND NECK TECHNIQUE: Multidetector CT imaging of the head and neck was performed using the standard protocol during bolus administration of intravenous contrast. Multiplanar CT image reconstructions and MIPs were obtained to evaluate the vascular anatomy. Carotid stenosis measurements (when applicable) are obtained utilizing NASCET criteria, using the distal internal carotid diameter as the denominator. CONTRAST:  6mL OMNIPAQUE IOHEXOL 350 MG/ML SOLN COMPARISON:  Brain MRI 03/03/2019, FINDINGS: CTA NECK FINDINGS Aortic arch: Standard aortic branching. Mild mixed plaque within the visualized aortic arch and proximal major branch vessels of the neck. No significant innominate or proximal subclavian stenosis. Right carotid system: CCA and ICA patent within the neck without stenosis. Minimal mixed plaque within the carotid bifurcation. Left carotid system: CCA and ICA patent within the neck without stenosis. Vertebral arteries: Codominant. The vertebral arteries are patent within the neck bilaterally without stenosis. Skeleton: No acute bony abnormality. Grade 1 anterolisthesis at the C2-C3 through C5-C6 levels. Other neck: No soft tissue neck  mass or cervical lymphadenopathy. Upper chest: No consolidation within the imaged lung apices. Review of the MIP images confirms the above findings CTA HEAD FINDINGS Anterior circulation: The intracranial internal carotid arteries are patent bilaterally. Calcified plaque within these vessels. Mild-to-moderate stenosis within the distal cavernous/paraclinoid right ICA. No significant stenosis within the intracranial left ICA. The M1 right middle cerebral artery is patent without significant stenosis. No right M2 proximal branch occlusion or high-grade  proximal stenosis is identified. The right anterior cerebral artery is patent without significant proximal stenosis. The M1 left middle cerebral artery is patent without significant stenosis. No left M2 proximal branch occlusion or high-grade proximal stenosis is identified. The left anterior cerebral artery is patent without significant proximal stenosis. No intracranial aneurysm is identified. Posterior circulation: The intracranial vertebral arteries are patent without significant stenosis, as is the basilar artery. The bilateral posterior cerebral arteries are patent without significant proximal stenosis. Posterior communicating arteries are poorly delineated and may be hypoplastic or absent bilaterally. Venous sinuses: Within limitations of contrast timing, no convincing thrombus. Anatomic variants: As described Review of the MIP images confirms the above findings IMPRESSION: CTA neck: The common carotid, internal carotid and vertebral arteries are patent within the neck without significant stenosis. Minimal plaque within the right carotid bifurcation. CTA head: 1. No intracranial large vessel occlusion or proximal high-grade arterial stenosis. 2. Calcified plaque within the intracranial internal carotid arteries bilaterally. Resultant mild/moderate narrowing of the distal cavernous/paraclinoid right ICA. Electronically Signed   By: Kellie Simmering DO   On: 03/04/2019 10:41   CT HEAD WO CONTRAST  Result Date: 03/03/2019 CLINICAL DATA:  Expressive aphasia. EXAM: CT HEAD WITHOUT CONTRAST TECHNIQUE: Contiguous axial images were obtained from the base of the skull through the vertex without intravenous contrast. COMPARISON:  12/19/2013 FINDINGS: Brain: No acute cortically based infarct, intracranial hemorrhage, mass, midline shift, or extra-axial fluid collection is identified. Hypodensities in the cerebral white matter bilaterally have progressed from the prior CT and are nonspecific but compatible with chronic  small vessel ischemic disease, mildly advanced for age. Mild cerebral atrophy is within normal limits for age. Vascular: Calcified atherosclerosis at the skull base. No hyperdense vessel. Skull: No fracture suspicious osseous lesion. Sinuses/Orbits: Visualized paranasal sinuses and mastoid air cells are clear. Orbits are unremarkable. Other: None. IMPRESSION: 1. No evidence of acute intracranial abnormality. 2. Mild chronic small vessel ischemic disease. Electronically Signed   By: Logan Bores M.D.   On: 03/03/2019 20:08   CT ANGIO NECK W OR WO CONTRAST  Result Date: 03/04/2019 CLINICAL DATA:  Transient ischemic attack (TIA). Additional history obtained from [place] presented to the emergency department with aphasia. EXAM: CT ANGIOGRAPHY HEAD AND NECK TECHNIQUE: Multidetector CT imaging of the head and neck was performed using the standard protocol during bolus administration of intravenous contrast. Multiplanar CT image reconstructions and MIPs were obtained to evaluate the vascular anatomy. Carotid stenosis measurements (when applicable) are obtained utilizing NASCET criteria, using the distal internal carotid diameter as the denominator. CONTRAST:  78mL OMNIPAQUE IOHEXOL 350 MG/ML SOLN COMPARISON:  Brain MRI 03/03/2019, FINDINGS: CTA NECK FINDINGS Aortic arch: Standard aortic branching. Mild mixed plaque within the visualized aortic arch and proximal major branch vessels of the neck. No significant innominate or proximal subclavian stenosis. Right carotid system: CCA and ICA patent within the neck without stenosis. Minimal mixed plaque within the carotid bifurcation. Left carotid system: CCA and ICA patent within the neck without stenosis. Vertebral arteries: Codominant. The vertebral arteries are patent  within the neck bilaterally without stenosis. Skeleton: No acute bony abnormality. Grade 1 anterolisthesis at the C2-C3 through C5-C6 levels. Other neck: No soft tissue neck mass  or cervical lymphadenopathy. Upper chest: No consolidation within the imaged lung apices. Review of the MIP images confirms the above findings CTA HEAD FINDINGS Anterior circulation: The intracranial internal carotid arteries are patent bilaterally. Calcified plaque within these vessels. Mild-to-moderate stenosis within the distal cavernous/paraclinoid right ICA. No significant stenosis within the intracranial left ICA. The M1 right middle cerebral artery is patent without significant stenosis. No right M2 proximal branch occlusion or high-grade proximal stenosis is identified. The right anterior cerebral artery is patent without significant proximal stenosis. The M1 left middle cerebral artery is patent without significant stenosis. No left M2 proximal branch occlusion or high-grade proximal stenosis is identified. The left anterior cerebral artery is patent without significant proximal stenosis. No intracranial aneurysm is identified. Posterior circulation: The intracranial vertebral arteries are patent without significant stenosis, as is the basilar artery. The bilateral posterior cerebral arteries are patent without significant proximal stenosis. Posterior communicating arteries are poorly delineated and may be hypoplastic or absent bilaterally. Venous sinuses: Within limitations of contrast timing, no convincing thrombus. Anatomic variants: As described Review of the MIP images confirms the above findings IMPRESSION: CTA neck: The common carotid, internal carotid and vertebral arteries are patent within the neck without significant stenosis. Minimal plaque within the right carotid bifurcation. CTA head: 1. No intracranial large vessel occlusion or proximal high-grade arterial stenosis. 2. Calcified plaque within the intracranial internal carotid arteries bilaterally. Resultant mild/moderate narrowing of the distal cavernous/paraclinoid right ICA. Electronically Signed   By: Kellie Simmering DO   On: 03/04/2019  10:41   MR BRAIN WO CONTRAST  Result Date: 03/03/2019 CLINICAL DATA:  Initial evaluation for acute altered mental status, aphasia, headache. EXAM: MRI HEAD WITHOUT CONTRAST TECHNIQUE: Multiplanar, multiecho pulse sequences of the brain and surrounding structures were obtained without intravenous contrast. COMPARISON:  Prior CT from earlier the same day. FINDINGS: Brain: Cerebral volume within normal limits for patient age. Patchy T2/FLAIR hyperintensity seen within the periventricular and deep white matter of both cerebral hemispheres, nonspecific, but most like related chronic microvascular ischemic disease, mild for age. No abnormal foci of restricted diffusion to suggest acute or subacute ischemia. Gray-white matter differentiation well maintained. No encephalomalacia to suggest chronic infarction. No foci of susceptibility artifact to suggest acute or chronic intracranial hemorrhage. No mass lesion, midline shift or mass effect. No hydrocephalus. No extra-axial fluid collection. Major dural sinuses are grossly patent. Pituitary gland and suprasellar region are normal. Midline structures intact and normal. Vascular: Major intracranial vascular flow voids well maintained and normal in appearance. Skull and upper cervical spine: Craniocervical junction normal. Visualized upper cervical spine within normal limits. Bone marrow signal intensity normal. Small lipoma noted at the right frontal scalp. Scalp soft tissues otherwise unremarkable. Sinuses/Orbits: Globes and orbital soft tissues within normal limits. Paranasal sinuses are clear. No mastoid effusion. Inner ear structures normal. Other: None. IMPRESSION: 1. No acute intracranial abnormality. 2. Mild chronic microvascular ischemic disease for age. Electronically Signed   By: Jeannine Boga M.D.   On: 03/03/2019 23:51   ECHOCARDIOGRAM COMPLETE  Result Date: 03/04/2019   ECHOCARDIOGRAM REPORT   Patient Name:   MASIYA BABICZ Date of Exam: 03/04/2019  Medical Rec #:  [id number]     Height:       63.0 in Accession #:    [id number]    Weight:  181.0 lb Date of Birth:  1945/08/18     BSA:          1.85 m Patient Age:    4 years      BP:           135/75 mmHg Patient Gender: F             HR:           77 bpm. Exam Location:  Inpatient Procedure: 2D Echo Indications:    Stroke 434.91 / I163.9  History:        Patient has no prior history of Echocardiogram examinations.                 NSTEMI and CAD, Signs/Symptoms:Chest Pain; Risk                 Factors:Hypertension, Diabetes and Dyslipidemia.  Sonographer:    Vikki Ports Turrentine Referring Phys: [id number] [place]  1. Left ventricular ejection fraction, by visual estimation, is 50 to 55%. The left ventricle has low normal function. There is no left ventricular hypertrophy.  2. Mild hypokinesis of the left ventricular, basal-mid inferior wall.  3. Left ventricular diastolic parameters are consistent with Grade I diastolic dysfunction (impaired relaxation).  4. The left ventricle demonstrates regional wall motion abnormalities.  5. Global right ventricle has normal systolic function.The right ventricular size is normal. No increase in right ventricular wall thickness.  6. Left atrial size was normal.  7. Right atrial size was normal.  8. The mitral valve is normal in structure. No evidence of mitral valve regurgitation. No evidence of mitral stenosis.  9. The tricuspid valve is normal in structure. 10. The tricuspid valve is normal in structure. Tricuspid valve regurgitation is not demonstrated. 11. The aortic valve is normal in structure. Aortic valve regurgitation is not visualized. No evidence of aortic valve sclerosis or stenosis. 12. The pulmonic valve was normal in structure. Pulmonic valve regurgitation is not visualized. 13. Normal pulmonary artery systolic pressure. 14. The inferior vena cava is normal in size with greater than 50% respiratory variability, suggesting right atrial  pressure of 3 mmHg. FINDINGS  Left Ventricle: Left ventricular ejection fraction, by visual estimation, is 50 to 55%. The left ventricle has low normal function. Mild hypokinesis of the left ventricular, basal-mid inferior wall. The left ventricle demonstrates regional wall motion abnormalities. There is no left ventricular hypertrophy. Left ventricular diastolic parameters are consistent with Grade I diastolic dysfunction (impaired relaxation). Normal left atrial pressure. Right Ventricle: The right ventricular size is normal. No increase in right ventricular wall thickness. Global RV systolic function is has normal systolic function. The tricuspid regurgitant velocity is 2.08 m/s, and with an assumed right atrial pressure  of 3 mmHg, the estimated right ventricular systolic pressure is normal at 20.3 mmHg. Left Atrium: Left atrial size was normal in size. Right Atrium: Right atrial size was normal in size Pericardium: There is no evidence of pericardial effusion. Mitral Valve: The mitral valve is normal in structure. No evidence of mitral valve regurgitation. No evidence of mitral valve stenosis by observation. Tricuspid Valve: The tricuspid valve is normal in structure. Tricuspid valve regurgitation is not demonstrated. Aortic Valve: The aortic valve is normal in structure. Aortic valve regurgitation is not visualized. The aortic valve is structurally normal, with no evidence of sclerosis or stenosis. Pulmonic Valve: The pulmonic valve was normal in structure. Pulmonic valve regurgitation is not visualized. Pulmonic regurgitation is not visualized. Aorta: The aortic root, ascending  aorta and aortic arch are all structurally normal, with no evidence of dilitation or obstruction. Venous: The inferior vena cava is normal in size with greater than 50% respiratory variability, suggesting right atrial pressure of 3 mmHg. IAS/Shunts: No atrial level shunt detected by color flow Doppler. There is no evidence of a patent  foramen ovale. No ventricular septal defect is seen or detected. There is no evidence of an atrial septal defect.  LEFT VENTRICLE PLAX 2D LVIDd:         4.90 cm  Diastology LVIDs:         3.10 cm  LV e' lateral:   7.07 cm/s LV PW:         1.10 cm  LV E/e' lateral: 8.1 LV IVS:        1.10 cm  LV e' medial:    5.77 cm/s LVOT diam:     2.00 cm  LV E/e' medial:  9.9 LV SV:         75 ml LV SV Index:   38.57 LVOT Area:     3.14 cm  RIGHT VENTRICLE RV S prime:     9.57 cm/s TAPSE (M-mode): 1.2 cm LEFT ATRIUM             Index       RIGHT ATRIUM           Index LA diam:        4.30 cm 2.32 cm/m  RA Area:     13.90 cm LA Vol (A2C):   45.1 ml 24.33 ml/m RA Volume:   31.80 ml  17.16 ml/m LA Vol (A4C):   46.8 ml 25.25 ml/m LA Biplane Vol: 46.0 ml 24.82 ml/m  AORTIC VALVE LVOT Vmax:   96.60 cm/s LVOT Vmean:  66.700 cm/s LVOT VTI:    0.183 m  AORTA Ao Root diam: 3.10 cm MITRAL VALVE                         TRICUSPID VALVE MV Area (PHT): 3.63 cm              TR Peak grad:   17.3 mmHg MV PHT:        60.61 msec            TR Vmax:        208.00 cm/s MV Decel Time: 209 msec MV E velocity: 57.00 cm/s  103 cm/s  SHUNTS MV A velocity: 102.00 cm/s 70.3 cm/s Systemic VTI:  0.18 m MV E/A ratio:  0.56        1.5       Systemic Diam: 2.00 cm  Dani Gobble Croitoru MD Electronically signed by Sanda Klein MD Signature Date/Time: 03/04/2019/10:08:50 AM    Final    US Abdomen Limited RUQ  Result Date: 03/03/2019 CLINICAL DATA:  74 year old female with elevated liver enzymes. Prior cholecystectomy. EXAM: ULTRASOUND ABDOMEN LIMITED RIGHT UPPER QUADRANT COMPARISON:  CT Abdomen and Pelvis 04/30/2015. FINDINGS: Gallbladder: Surgically absent. Common bile duct: Diameter: 8-9 millimeters diameter, stable compared to the 2017 CT. No intraductal filling defect identified. Liver: Echogenic liver (image 13). No discrete liver lesion. No intrahepatic biliary ductal dilatation identified. Portal vein is patent on color Doppler imaging with normal  direction of blood flow towards the liver. Other: Negative visible right kidney. IMPRESSION: 1. Evidence of hepatic steatosis. 2. Chronic cholecystectomy with stable mild postoperative CBD enlargement since 2017. Electronically Signed   By: Herminio Heads.D.  On: 03/03/2019 23:52    PHYSICAL EXAM  Temp:  [97.7 F (36.5 C)-98.7 F (37.1 C)] 98.7 F (37.1 C) (01/26 0801) Pulse Rate:  [70-98] 77 (01/26 0801) Resp:  [13-17] 15 (01/26 0801) BP: (111-143)/(57-76) 135/75 (01/26 0801) SpO2:  [91 %-96 %] 93 % (01/26 0801) Weight:  [82.1 kg] 82.1 kg (01/26 0113)  General - Well nourished, well developed, in no apparent distress.  Ophthalmologic - fundi not visualized due to noncooperation.  Cardiovascular - Regular rhythm and rate.  Mental Status -  Level of arousal and orientation to time, place, and person were intact. Language including expression, naming, repetition, comprehension was assessed and found intact. Fund of Knowledge was assessed and was intact  Cranial Nerves II - XII - II - Visual field intact OU. III, IV, VI - Extraocular movements intact. V - Facial sensation intact bilaterally. VII - Facial movement intact bilaterally. VIII - Hearing & vestibular intact bilaterally. X - Palate elevates symmetrically. XI - Chin turning & shoulder shrug intact bilaterally. XII - Tongue protrusion intact.  Motor Strength - The patient's strength was normal in all extremities and pronator drift was absent.  Bulk was normal and fasciculations were absent.   Motor Tone - Muscle tone was assessed at the neck and appendages and was normal.  Reflexes - The patient's reflexes were symmetrical in all extremities and she had no pathological reflexes.  Sensory - Light touch, temperature/pinprick were assessed and were symmetrical.    Coordination - The patient had normal movements in the hands and feet with no ataxia or dysmetria.  Tremor was absent.  Gait and Station -  deferred.   ASSESSMENT/PLAN Ms. HAZEN VANDERHART is a 74 y.o. female with history of diabetes mellitus, COPD, hypertension, coronary artery disease, migraines presenting with transient garbled speech and expressive aphasia following a migraine.   Most likely complicated Migraine - however, given risk factors, TIA is still in DDx  CT head No acute abnormality.  Small vessel disease.     MRI  No acute abnormality.  Small vessel disease.     CTA head B ICA plaques w/ mild/mod narrowing R ICA  CTA neck minimal plaque R ICA bifurcation  2D Echo EF 50-55%. No source of embolus   Recommend 30 day cardiac event monitoring as outpt to rule out afib  LDL 52  HgbA1c 7.9  Lovenox 40 mg sq daily for VTE prophylaxis  No antithrombotic prior to admission, now on aspirin 81 mg daily and clopidogrel 75 mg daily. Recommend DAPT for 3 weeks and then ASA alone  Therapy recommendations:  outpt PT for BPPV  Disposition:  Return home  Migraine   Hx of migraine with visual aura  This time episode concerning for complicated migraine with speech difficulty  Continue follow up with PCP  Consider neuro follow up if needed.  BPPV, chronic  Hx of BPPV - received 17 Epley maneuver in the past  Had one episode while laid down for CT  Continue outpt follow up  Hypertension  High with EMS  Stable . 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 Please call with questions. Pt will follow up with stroke clinic NP at [facility] in about 4 weeks. Thanks for the consult.  Rosalin Hawking, MD PhD Stroke Neurology 03/04/2019 12:57 PM   To contact Stroke Continuity provider, please refer to [email or web address] After hours, contact General Neurology

## 2019-03-04 NOTE — Evaluation (Addendum)
Speech Language Pathology Evaluation Patient Details Name: Valerie Wood MRN: [id number] DOB: 03/25/1945 Today's Date: 03/04/2019 Time: 1120-1140 SLP Time Calculation (min) (ACUTE ONLY): 20 min  Problem List:  Patient Active Problem List   Diagnosis Date Noted  . Aphasia 03/03/2019  . UTI (urinary tract infection) 03/03/2019  . Vulvovaginitis 01/13/2019  . Preoperative clearance 03/12/2018  . CAD (coronary artery disease), native coronary artery 03/12/2018  . Hyperparathyroidism, primary ([place]) 03/09/2018  . Acute pain of right shoulder 05/28/2017  . Chest pain at rest   . Hyperglycemia   . NSTEMI (non-ST elevated myocardial infarction) ([place])   . Diabetes mellitus without complication ([place]) [id number]  . Essential hypertension 04/28/2014  . HLD (hyperlipidemia) 04/28/2014  . Chest pain 04/28/2014  . Pain in the chest   . Fall 12/22/2013  . Concussion 12/22/2013  . Lumbar transverse process fracture ([place]) 12/22/2013  . Migraine 12/22/2013  . DM (diabetes mellitus) ([place]) 12/22/2013  . Fracture of multiple ribs of right side 12/19/2013  . GERD (gastroesophageal reflux disease) 08/03/2011  . Gastroparesis 08/03/2011  . Anxiety 08/03/2011  . Rectocele 05/12/2011  . 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 COLONIC POLYPS, ADENOMATOUS, HX OF 05/02/2007   Past Medical History:  Past Medical History:  Diagnosis Date  . Anxiety   . Arthritis    hands, all over, back  . Atherosclerosis 2017  . 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 Bilateral dry eyes   . Cataract    Bilateral  . COPD (chronic obstructive pulmonary disease) ([place])   . Diabetes mellitus    pt didn't divulge DM during PAT interview but Dr. Paulene Floor nurse states that pt is type 2 diabetic, controlled by diet.  . Diverticulosis, sigmoid   . Duodenitis without mention of hemorrhage   . Enterocele    History of  . Esophagitis, unspecified   . Fibromyalgia   . Gastroparesis due to DM ([place])   . GERD  (gastroesophageal reflux disease)   . Glaucoma   . History of concussion   . Hx of colonic polyps   . Hyperlipidemia   . Hypertension   . L4-L5 disc bulge 03/2003  . Migraine   . Myocardial infarction [facility])    NSTEMI, patient states no one told her of this  . Osteopenia   . 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 PONV (postoperative nausea and vomiting)   . Rectocele    History of  . Right rib fracture 12/20/2013  . Seasonal allergies   . Shortness of breath    quit smoking 2013  . Skin cancer    Nose  . Stricture and stenosis of esophagus   . Thyroid nodule    Bilateral  . Tobacco dependence   . Uterine cancer ([place]) 1989  . Vertigo   . Vitamin D deficiency    Past Surgical History:  Past Surgical History:  Procedure Laterality Date  . APPENDECTOMY    . BLADDER SURGERY     Tack   . CHOLECYSTECTOMY    . COLONOSCOPY  last one 11/09/2017   multiple  . DILATION AND EVACUATION    . ESOPHAGOGASTRODUODENOSCOPY  last one 12/06/2004   multiple  . LEEP    . PARATHYROIDECTOMY Right 03/15/2018   Procedure: RIGHT INFERIOR PARATHYROIDECTOMY;  Surgeon: Armandina Gemma, MD;  Location: [facility] ORS;  Service: General;  Laterality: Right;  . reclast     2019  . RECTOCELE REPAIR  05/11/2011   Procedure: POSTERIOR REPAIR (RECTOCELE);  Surgeon: Cheri Fowler, MD;  Location: [facility]  ORS;  Service: Gynecology;  Laterality: N/A;  . TONSILLECTOMY    . TUBAL LIGATION    . VAGINAL HYSTERECTOMY  12/1987   BSO   HPI:  Valerie Wood is a 74 y.o. female with past medical history of diabetes mellitus, COPD, hypertension, coronary artery disease, migraines presented to the ED with sudden onset transient garbled speech and difficulty getting words out. The entire episode lasted about 45 minutes. Her blood pressure was 220 /120 mmHg. In ED, CT head was unremarkable. She has a history of migraine with aura which is usually visual.  She has never complained of difficulty with speech with her prior migraine headaches.  Of  note, patient reports to this SLP that she fell out of a 2nd story window "about 5 years ago," she states this "knocked her out."   Assessment / Plan / Recommendation Clinical Impression   Patient seen at bedside for cognitive-communication evaluation. Patient presents with functionally adequate cognitive-communication skills, marked by minimal impairment noted in delayed recall. Patient is alert, oriented x4. No evidence of dysarthria or motor speech impairment. Patient reports she lives alone, daughter is nearby. Expressive and comprehensive language appear to be grossly [facility]. Patient able to follow multi-step verbal commands, answer complex yes/no questions without difficulty. Pt able to name items, denies word finding difficulty at this time. Speech is fluent, noted to be mildly verbose. Portions of the COGNISTAT administered with scores as follows:  Orientation: 10/10 [facility] Attention: 8/8 WFL Repetition: 12/12 [facility] Memory: 8/12 [facility] Calculations: 3/4 WFL Reasoning, similarities: 7/8 WFL Reasoning, judgement: 5/6 WFL  Discussed findings/results with patient. Patient reports she does not feel that she has "memory difficulties," and attributes her performance on today's assessment to not feeling very well. SLP discussed patient following up with PCP or OP ST if she feels her condition changes/worsens. She verbalized understanding and agreement. Patient may benefit from follow-up at the outpatient level of care. No inpatient ST services are indicated. Please re-consult should needs arise.     SLP Assessment  SLP Recommendation/Assessment: All further Speech Lanaguage Pathology  needs can be addressed in the next venue of care SLP Visit Diagnosis: Cognitive communication deficit (R41.841)    Follow Up Recommendations  Outpatient SLP    Frequency and Duration           SLP Evaluation Cognition  Overall Cognitive Status: Within Functional Limits for tasks  assessed Arousal/Alertness: Awake/alert Orientation Level: Oriented X4 Attention: Sustained;Alternating Sustained Attention: Appears intact Alternating Attention: Appears intact Memory: Impaired Memory Impairment: Decreased short term memory Decreased Short Term Memory: Verbal basic Awareness: Appears intact Problem Solving: Appears intact Executive Function: Reasoning Reasoning: Appears intact Safety/Judgment: Appears intact       Comprehension  Auditory Comprehension Overall Auditory Comprehension: Appears within functional limits for tasks assessed Yes/No Questions: Within Functional Limits Commands: Within Functional Limits Conversation: Complex Other Conversation Comments: some verbosity    Expression Expression Primary Mode of Expression: Verbal Verbal Expression Initiation: No impairment Level of Generative/Spontaneous Verbalization: Conversation Repetition: No impairment Naming: No impairment Written Expression Dominant Hand: Right Written Expression: Not tested   Oral / Motor  Oral Motor/Sensory Function Overall Oral Motor/Sensory Function: Within functional limits Motor Speech Overall Motor Speech: Appears within functional limits for tasks assessed   [place], M.Ed., CCC-SLP Speech Therapy Acute Rehabilitation [phone]: Acute Rehab office [phone] - pager            03/04/2019, 11:49 AM

## 2019-03-04 NOTE — Evaluation (Signed)
Physical Therapy Evaluation Patient Details Name: Valerie Wood MRN: [id number] DOB: 1945/12/10 Today's Date: 03/04/2019   History of Present Illness  Pt is a 74 y.o. female with past medical history of diabetes mellitus, COPD, hypertension, coronary artery disease, migraines presents to the emergency department with sudden onset transient garbled speech and difficulty getting words out.  MRI and CTA negative for acute changes.  Clinical Impression  Pt admitted with above diagnosis.  She present swith baseline mobility.  Demonstrated safe gait and transfers without LOB.  Strength equal bilaterally.  No further PT needs.     Follow Up Recommendations No PT follow up    Equipment Recommendations  None recommended by PT    Recommendations for Other Services       Precautions / Restrictions Precautions Precautions: None      Mobility  Bed Mobility Overal bed mobility: Independent                Transfers Overall transfer level: Needs assistance Equipment used: None Transfers: Sit to/from Stand Sit to Stand: Supervision            Ambulation/Gait Ambulation/Gait assistance: Supervision Gait Distance (Feet): 300 Feet Assistive device: None Gait Pattern/deviations: WFL(Within Functional Limits) Gait velocity: normal   General Gait Details: able to look up/down/L/R, stop, turn, step over object, and change speeds without LOB; reports feels at baseline  Stairs            Wheelchair Mobility    Modified Rankin (Stroke Patients Only)       Balance   Sitting-balance support: No upper extremity supported;Feet supported Sitting balance-Leahy Scale: Normal     Standing balance support: No upper extremity supported;During functional activity Standing balance-Leahy Scale: Normal                               Pertinent Vitals/Pain Pain Assessment: No/denies pain Pain Score: 2  Faces Pain Scale: Hurts a little bit Pain Location:  general Pain Descriptors / Indicators: Discomfort Pain Intervention(s): Monitored during session    Home Living Family/patient expects to be discharged to:: Private residence Living Arrangements: Alone Available Help at Discharge: Family;Available PRN/intermittently Type of Home: House Home Access: Stairs to enter Entrance Stairs-Rails: None Entrance Stairs-Number of Steps: 1 Home Layout: One level Home Equipment: Grab bars - tub/shower;Grab bars - toilet;Shower seat - built in;Cane - single point Additional Comments: reports chronic dizziness s/p fall; has tried [employer]    Prior Function Level of Independence: Independent         Comments: Independent with ADLs and IADLs; drives locally     Hand Dominance   Dominant Hand: Right    Extremity/Trunk Assessment   Upper Extremity Assessment Upper Extremity Assessment: Overall WFL for tasks assessed(Coordination WNL; Strength 5/5; ROM WNL)    Lower Extremity Assessment Lower Extremity Assessment: Overall WFL for tasks assessed(Coordination WNL; Strength 5/5; ROM WNL; reports neuropathy baseline)       Communication   Communication: No difficulties  Cognition Arousal/Alertness: Awake/alert Behavior During Therapy: WFL for tasks assessed/performed Overall Cognitive Status: Within Functional Limits for tasks assessed                                        General Comments General comments (skin integrity, edema, etc.): vss    Exercises     Assessment/Plan  PT Assessment Patent does not need any further PT services  PT Problem List         PT Treatment Interventions      PT Goals (Current goals can be found in the Care Plan section)  Acute Rehab PT Goals Patient Stated Goal: return home PT Goal Formulation: With patient Time For Goal Achievement: 03/04/19 Potential to Achieve Goals: Good    Frequency     Barriers to discharge        Co-evaluation               AM-PAC PT "6  Clicks" Mobility  Outcome Measure Help needed turning from your back to your side while in a flat bed without using bedrails?: None Help needed moving from lying on your back to sitting on the side of a flat bed without using bedrails?: None Help needed moving to and from a bed to a chair (including a wheelchair)?: None Help needed standing up from a chair using your arms (e.g., wheelchair or bedside chair)?: None Help needed to walk in hospital room?: None Help needed climbing 3-5 steps with a railing? : None 6 Click Score: 24    End of Session Equipment Utilized During Treatment: Gait belt Activity Tolerance: Patient tolerated treatment well Patient left: in bed;with call bell/phone within reach(pt has been walking to bathroom independently) Nurse Communication: Mobility status      Time: [id number] PT Time Calculation (min) (ACUTE ONLY): 21 min   Charges:   PT Evaluation $PT Eval Low Complexity: 1 Low          Maggie Font, PT Acute Rehab Services Pager [phone] [facility] [phone] [facility] [place] 03/04/2019, 12:26 PM

## 2019-03-04 NOTE — Progress Notes (Signed)
  Echocardiogram 2D Echocardiogram has been performed.   A  03/04/2019, 8:28 AM

## 2019-03-04 NOTE — TOC Transition Note (Signed)
Transition of Care [facility]) - CM/SW Discharge Note   Patient Details  Name: Valerie Wood MRN: [id number] Date of Birth: Apr 24, 1945  Transition of Care [facility]) CM/SW Contact:  Pollie Friar, RN Phone Number: 03/04/2019, 2:38 PM   Clinical Narrative:    Pt discharging home with self care. No f/u per PT/OT and no DME needs.  Pt has transportation home.   Final next level of care: Home/Self Care Barriers to Discharge: No Barriers Identified   Patient Goals and CMS Choice        Discharge Placement                       Discharge Plan and Services                                     Social Determinants of Health (SDOH) Interventions     Readmission Risk Interventions No flowsheet data found.

## 2019-03-04 NOTE — ED Notes (Signed)
This RN attempted to call report, was asked to call back to allow floor RN time for lunch. Will call back in 38min if RN does not call

## 2019-03-04 NOTE — Consult Note (Signed)
Requesting Physician: Dr. Ishmael Holter    Chief Complaint: Stroke   History obtained from: Patient and Chart    HPI:                                                                                                                                       Valerie Wood is a 74 y.o. female with past medical history of diabetes mellitus, COPD, hypertension, coronary artery disease, migraines presents to the emergency department with sudden onset transient garbled speech and difficulty getting words out.  Last known normal was 4 PM per ED triage, however symptom onset was likely later than that around 6 PM.  According to the patient, she developed a migraine headache around 3:30 PM in the afternoon and had her lunch which helped to ease the headache.  By 5 PM she no longer had a headache.  She states between 5:30 PM and 6 PM she received a call from her daughter and she was talking perfectly fine when suddenly her speech became garbled, she had difficulty getting words out and her daughter said she has been called the ambulance.  She then felt confused had difficulty unlocking the security code.  By the time EMS arrived most of her symptoms started to improve.  The entire episode lasted about 45 minutes and she now feels back to her baseline.  Her blood pressure was 220 /120 mmHg.   On arrival to [facility], ER, blood pressure was 164 x 94, CBG was 160.  A stat CT head was performed which did not show any acute abnormalities. She does have a history of migraine with aura which is usually visual.  She has never complained of difficulty with speech with her prior migraine headaches.  Date last known well: 1.25 .21 Time last known well: around 4 PM tPA Given: No, symptoms resolved NIHSS: 0 Baseline MRS 0   Past Medical History:  Diagnosis Date  . Anxiety   . Arthritis    hands, all over, back  . Atherosclerosis 2017  . 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 Bilateral dry eyes   . Cataract     Bilateral  . COPD (chronic obstructive pulmonary disease) ([place])   . Diabetes mellitus    pt didn't divulge DM during PAT interview but Dr. Paulene Floor nurse states that pt is type 2 diabetic, controlled by diet.  . Diverticulosis, sigmoid   . Duodenitis without mention of hemorrhage   . Enterocele    History of  . Esophagitis, unspecified   . Fibromyalgia   . Gastroparesis due to DM ([place])   . GERD (gastroesophageal reflux disease)   . Glaucoma   . History of concussion   . Hx of colonic polyps   . Hyperlipidemia   . Hypertension   . L4-L5 disc bulge 03/2003  . Migraine   . Myocardial infarction [facility])    NSTEMI, patient  states no one told her of this  . Osteopenia   . 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 PONV (postoperative nausea and vomiting)   . Rectocele    History of  . Right rib fracture 12/20/2013  . Seasonal allergies   . Shortness of breath    quit smoking 2013  . Skin cancer    Nose  . Stricture and stenosis of esophagus   . Thyroid nodule    Bilateral  . Tobacco dependence   . Uterine cancer ([place]) 1989  . Vertigo   . Vitamin D deficiency     Past Surgical History:  Procedure Laterality Date  . APPENDECTOMY    . BLADDER SURGERY     Tack   . CHOLECYSTECTOMY    . COLONOSCOPY  last one 11/09/2017   multiple  . DILATION AND EVACUATION    . ESOPHAGOGASTRODUODENOSCOPY  last one 12/06/2004   multiple  . LEEP    . PARATHYROIDECTOMY Right 03/15/2018   Procedure: RIGHT INFERIOR PARATHYROIDECTOMY;  Surgeon: Armandina Gemma, MD;  Location: [facility]S;  Service: General;  Laterality: Right;  . reclast     2019  . RECTOCELE REPAIR  05/11/2011   Procedure: POSTERIOR REPAIR (RECTOCELE);  Surgeon: Cheri Fowler, MD;  Location: [facility];  Service: Gynecology;  Laterality: N/A;  . TONSILLECTOMY    . TUBAL LIGATION    . VAGINAL HYSTERECTOMY  12/1987   BSO    Family History  Problem Relation Age of Onset  . Colon cancer Father 17  . Stomach cancer Father   . Heart disease  Mother        MI, CVA  . Rectal cancer Neg Hx   . Esophageal cancer Neg Hx    Social History:  reports that she quit smoking about 8 years ago. Her smoking use included cigarettes. She has a 40.00 pack-year smoking history. She has never used smokeless tobacco. She reports that she does not drink alcohol or use drugs.  Allergies:  Allergies  Allergen Reactions  . Canagliflozin Itching and Nausea And Vomiting    Yeast infection  . Macrolides And Ketolides Nausea And Vomiting  . Morphine And Related Nausea And Vomiting  . Tetracyclines & Related Itching and Nausea And Vomiting  . Tramadol Nausea And Vomiting  . Doxycycline Hyclate     Stomach upset  . Gabapentin     Felt bad  . Glipizide Diarrhea  . Iloperidone Dermatitis  . Januvia [Sitagliptin] Nausea And Vomiting  . Macrobid [Nitrofurantoin Macrocrystal] Nausea And Vomiting  . Metformin   . Nitrofurantoin     n/v  . Pantoprazole Nausea And Vomiting  . Symbicort [Budesonide-Formoterol Fumarate]     Thrush   . Victoza [Liraglutide] Nausea And Vomiting  . Ciprofloxacin Hcl Rash    Medications:                                                                                                                        I reviewed home medications  ROS:                                                                                                                                     14 systems reviewed and negative except above    Examination:                                                                                                      General: Appears well-developed  Psych: Affect appropriate to situation Eyes: No scleral injection HENT: No OP obstrucion Head: Normocephalic.  Cardiovascular: Normal rate and regular rhythm.  Respiratory: Effort normal and breath sounds normal to anterior ascultation GI: Soft.  No distension. There is no tenderness.  Skin: WDI    Neurological Examination Mental Status: Alert,  oriented, thought content appropriate.  Speech fluent without evidence of aphasia. Able to follow 3 step commands without difficulty. Cranial Nerves: II: Visual fields grossly normal,  III,IV, VI: ptosis not present, extra-ocular motions intact bilaterally, pupils equal, round, reactive to light and accommodation V,VII: smile symmetric, facial light touch sensation normal bilaterally VIII: hearing normal bilaterally IX,X: uvula rises symmetrically XI: bilateral shoulder shrug XII: midline tongue extension Motor: Right : Upper extremity   5/5    Left:     Upper extremity   5/5  Lower extremity   5/5     Lower extremity   5/5 Tone and bulk:normal tone throughout; no atrophy noted Sensory: Pinprick and light touch intact throughout, bilaterally Deep Tendon Reflexes: 2+ and symmetric throughout Plantars: Right: downgoing   Left: downgoing Cerebellar: normal finger-to-nose, normal rapid alternating movements and normal heel-to-shin test Gait: normal gait and station     Lab Results: Basic Metabolic Panel: Recent Labs  Lab 03/03/19 1916  NA 137  K 4.9  CL 103  CO2 23  GLUCOSE 226*  BUN 18  CREATININE 0.86  CALCIUM 9.8    CBC: Recent Labs  Lab 03/03/19 1916  WBC 7.3  NEUTROABS 4.5  HGB 15.0  HCT 44.5  MCV 91.6  PLT 231    Coagulation Studies: Recent Labs    03/03/19 1957  LABPROT 12.8  INR 1.0    Imaging: CT HEAD WO CONTRAST  Result Date: 03/03/2019 CLINICAL DATA:  Expressive aphasia. EXAM: CT HEAD WITHOUT CONTRAST TECHNIQUE: Contiguous axial images were obtained from the base of the skull through the vertex without intravenous contrast. COMPARISON:  12/19/2013 FINDINGS: Brain: No acute cortically based infarct, intracranial hemorrhage, mass, midline shift, or extra-axial fluid collection is identified.  Hypodensities in the cerebral white matter bilaterally have progressed from the prior CT and are nonspecific but compatible with chronic small vessel ischemic  disease, mildly advanced for age. Mild cerebral atrophy is within normal limits for age. Vascular: Calcified atherosclerosis at the skull base. No hyperdense vessel. Skull: No fracture suspicious osseous lesion. Sinuses/Orbits: Visualized paranasal sinuses and mastoid air cells are clear. Orbits are unremarkable. Other: None. IMPRESSION: 1. No evidence of acute intracranial abnormality. 2. Mild chronic small vessel ischemic disease. Electronically Signed   By: Logan Bores M.D.   On: 03/03/2019 20:08   MR BRAIN WO CONTRAST  Result Date: 03/03/2019 CLINICAL DATA:  Initial evaluation for acute altered mental status, aphasia, headache. EXAM: MRI HEAD WITHOUT CONTRAST TECHNIQUE: Multiplanar, multiecho pulse sequences of the brain and surrounding structures were obtained without intravenous contrast. COMPARISON:  Prior CT from earlier the same day. FINDINGS: Brain: Cerebral volume within normal limits for patient age. Patchy T2/FLAIR hyperintensity seen within the periventricular and deep white matter of both cerebral hemispheres, nonspecific, but most like related chronic microvascular ischemic disease, mild for age. No abnormal foci of restricted diffusion to suggest acute or subacute ischemia. Gray-white matter differentiation well maintained. No encephalomalacia to suggest chronic infarction. No foci of susceptibility artifact to suggest acute or chronic intracranial hemorrhage. No mass lesion, midline shift or mass effect. No hydrocephalus. No extra-axial fluid collection. Major dural sinuses are grossly patent. Pituitary gland and suprasellar region are normal. Midline structures intact and normal. Vascular: Major intracranial vascular flow voids well maintained and normal in appearance. Skull and upper cervical spine: Craniocervical junction normal. Visualized upper cervical spine within normal limits. Bone marrow signal intensity normal. Small lipoma noted at the right frontal scalp. Scalp soft tissues  otherwise unremarkable. Sinuses/Orbits: Globes and orbital soft tissues within normal limits. Paranasal sinuses are clear. No mastoid effusion. Inner ear structures normal. Other: None. IMPRESSION: 1. No acute intracranial abnormality. 2. Mild chronic microvascular ischemic disease for age. Electronically Signed   By: Jeannine Boga M.D.   On: 03/03/2019 23:51   US Abdomen Limited RUQ  Result Date: 03/03/2019 CLINICAL DATA:  74 year old female with elevated liver enzymes. Prior cholecystectomy. EXAM: ULTRASOUND ABDOMEN LIMITED RIGHT UPPER QUADRANT COMPARISON:  CT Abdomen and Pelvis 04/30/2015. FINDINGS: Gallbladder: Surgically absent. Common bile duct: Diameter: 8-9 millimeters diameter, stable compared to the 2017 CT. No intraductal filling defect identified. Liver: Echogenic liver (image 13). No discrete liver lesion. No intrahepatic biliary ductal dilatation identified. Portal vein is patent on color Doppler imaging with normal direction of blood flow towards the liver. Other: Negative visible right kidney. IMPRESSION: 1. Evidence of hepatic steatosis. 2. Chronic cholecystectomy with stable mild postoperative CBD enlargement since 2017. Electronically Signed   By: Genevie Ann M.D.   On: 03/03/2019 23:52     ASSESSMENT AND PLAN  74 y.o. female with past medical history of diabetes mellitus, COPD, hypertension, coronary artery disease, migraines presents to the emergency department with sudden onset transient garbled speech and difficulty getting words out.  Given its abrupt onset in the absence of associated headache less likely this is a complex migraine.  Patient has significant stroke risk factors therefore will recommend work-up for TIA.  Other possibility includes hypertensive emergency however, given how abruptly began this would be less likely  Transient ischemic attack D/D hypertensive emergency, migraine  Recommendations  # MRI of the brain without contrast #MRA Head and carotid  doppler or CTA head and neck  #Transthoracic Echo  # Start patient on ASA  325mg  daily #Start or continue Atorvastatin 80 mg/other high intensity statin # BP goal: permissive HTN upto 220/120 mmHg ( 185/110 if patient has CHF, CKD) # HBAIC and Lipid profile # Telemetry monitoring # Frequent neuro checks # NPO until passes stroke swallow screen  Please page stroke NP  Or  PA  Or MD from 8am -4 pm  as this patient from this time will be  followed by the stroke.   You can look them up on [email or web address]  Password [facility]      [facility] Pager Number [phone]

## 2019-03-04 NOTE — Plan of Care (Signed)
  Problem: Education: Goal: Individualized Educational Video(s) Outcome: Progressing   Problem: Education: Goal: Knowledge of secondary prevention will improve Outcome: Progressing   Problem: Education: Goal: Knowledge of disease or condition will improve Outcome: Progressing   

## 2019-03-04 NOTE — Evaluation (Signed)
Occupational Therapy Evaluation Patient Details Name: Valerie Wood MRN: [id number] DOB: 12/04/1945 Today's Date: 03/04/2019    History of Present Illness Pt is a 74 y.o. female with past medical history of diabetes mellitus, COPD, hypertension, coronary artery disease, migraines presents to the emergency department with sudden onset transient garbled speech and difficulty getting words out.  MRI and CTA negative for acute changes.   Clinical Impression   Patient evaluated by Occupational Therapy with no further acute OT needs identified. All education has been completed and the patient has no further questions. Pt appears to be at baseline.  Reviewed BEFAST.   See below for any follow-up Occupational Therapy or equipment needs. OT is signing off. Thank you for this referral.      Follow Up Recommendations  No OT follow up;Supervision - Intermittent    Equipment Recommendations  None recommended by OT    Recommendations for Other Services       Precautions / Restrictions Precautions Precautions: None      Mobility Bed Mobility Overal bed mobility: Independent             General bed mobility comments: Pt sitting EOB   Transfers Overall transfer level: Modified independent Equipment used: None Transfers: Sit to/from Stand Sit to Stand: Supervision              Balance Overall balance assessment: Mild deficits observed, not formally tested Sitting-balance support: No upper extremity supported;Feet supported Sitting balance-Leahy Scale: Normal     Standing balance support: No upper extremity supported;During functional activity Standing balance-Leahy Scale: Normal                             ADL either performed or assessed with clinical judgement   ADL Overall ADL's : Modified independent                                       General ADL Comments: Pt report chronic dizziness.  Discussed sitting to shower as she has a built in  seat.       Vision Patient Visual Report: No change from baseline Additional Comments: Pt reports vision is at baseline      Perception     Praxis      Pertinent Vitals/Pain Pain Assessment: No/denies pain Pain Score: 2  Faces Pain Scale: Hurts a little bit Pain Location: general Pain Descriptors / Indicators: Discomfort Pain Intervention(s): Monitored during session     Hand Dominance Right   Extremity/Trunk Assessment Upper Extremity Assessment Upper Extremity Assessment: Overall WFL for tasks assessed   Lower Extremity Assessment Lower Extremity Assessment: Overall WFL for tasks assessed   Cervical / Trunk Assessment Cervical / Trunk Assessment: Normal   Communication Communication Communication: No difficulties   Cognition Arousal/Alertness: Awake/alert Behavior During Therapy: WFL for tasks assessed/performed Overall Cognitive Status: Within Functional Limits for tasks assessed                                     General Comments  Pt reports chronic dizziness and has been seeing OPPT on and off for several years.  reviewed BEFAST with pt     Exercises     Shoulder Instructions      Home Living Family/patient expects to be discharged to:: Private residence  Living Arrangements: Alone Available Help at Discharge: Family;Available PRN/intermittently Type of Home: House Home Access: Stairs to enter [employer] of Steps: 1 Entrance Stairs-Rails: None Home Layout: One level     Bathroom Shower/Tub: Occupational psychologist: Handicapped height Bathroom Accessibility: Yes   Home Equipment: Grab bars - tub/shower;Grab bars - toilet;Shower seat - built in;Cane - single point   Additional Comments: reports chronic dizziness s/p fall; has tried [employer] With: Alone    Prior Functioning/Environment Level of Independence: Independent        Comments: Independent with ADLs and IADLs; drives locally        OT  Problem List: Impaired balance (sitting and/or standing)      OT Treatment/Interventions:      OT Goals(Current goals can be found in the care plan section) Acute Rehab OT Goals Patient Stated Goal: to go home today  OT Goal Formulation: All assessment and education complete, DC therapy  OT Frequency:     Barriers to D/C:            Co-evaluation              AM-PAC OT "6 Clicks" Daily Activity     Outcome Measure Help from another person eating meals?: None Help from another person taking care of personal grooming?: None Help from another person toileting, which includes using toliet, bedpan, or urinal?: None Help from another person bathing (including washing, rinsing, drying)?: None Help from another person to put on and taking off regular upper body clothing?: None Help from another person to put on and taking off regular lower body clothing?: None 6 Click Score: 24   End of Session    Activity Tolerance: Patient tolerated treatment well Patient left: in bed;with call bell/phone within reach  OT Visit Diagnosis: Dizziness and giddiness (R42)                Time: [id number] OT Time Calculation (min): 9 min Charges:  OT General Charges $OT Visit: 1 Visit OT Evaluation $OT Eval Low Complexity: 1 Low  Nilsa Nutting., OTR/L Acute Rehabilitation Services Pager [phone] Office [phone]   Lucille Passy M 03/04/2019, 1:23 PM

## 2019-03-04 NOTE — Progress Notes (Signed)
74 year old female admitted from [facility] Ed to  40 [address] PCU   Rm  04  From home c/o confusion and difficulty word finding  Which has resolve. Pt alert awake and oriented x 4  Mae and answers questions appropriately.  Vs stable.   Pt oriented to unit and use of call light for help. Plan of care discussed and pt demonstrated good understanding. RN will continue to monitor pt.

## 2019-03-05 ENCOUNTER — Telehealth: Payer: Self-pay | Admitting: *Deleted

## 2019-03-05 LAB — URINE CULTURE: Culture: 90000 — AB

## 2019-03-05 NOTE — Telephone Encounter (Signed)
Patient states that she is returning call and would like to leave an accurate address for monitor to be sent to. Please call to clarify.  Address:   [address]. [place], [place]  [place]

## 2019-03-05 NOTE — Telephone Encounter (Signed)
Patient enrolled for [employer] to ship a 30 day cardiac event monitor to her home.  Instructions will be included in the monitor kit.

## 2019-03-13 ENCOUNTER — Telehealth: Payer: Self-pay | Admitting: Cardiology

## 2019-03-13 DIAGNOSIS — R7989 Other specified abnormal findings of blood chemistry: Secondary | ICD-10-CM | POA: Diagnosis not present

## 2019-03-13 DIAGNOSIS — I10 Essential (primary) hypertension: Secondary | ICD-10-CM | POA: Diagnosis not present

## 2019-03-13 DIAGNOSIS — Z794 Long term (current) use of insulin: Secondary | ICD-10-CM | POA: Diagnosis not present

## 2019-03-13 DIAGNOSIS — M810 Age-related osteoporosis without current pathological fracture: Secondary | ICD-10-CM | POA: Diagnosis not present

## 2019-03-13 DIAGNOSIS — Z8639 Personal history of other endocrine, nutritional and metabolic disease: Secondary | ICD-10-CM | POA: Diagnosis not present

## 2019-03-13 DIAGNOSIS — E119 Type 2 diabetes mellitus without complications: Secondary | ICD-10-CM | POA: Diagnosis not present

## 2019-03-13 DIAGNOSIS — Z7189 Other specified counseling: Secondary | ICD-10-CM | POA: Diagnosis not present

## 2019-03-13 DIAGNOSIS — E669 Obesity, unspecified: Secondary | ICD-10-CM | POA: Diagnosis not present

## 2019-03-13 DIAGNOSIS — K219 Gastro-esophageal reflux disease without esophagitis: Secondary | ICD-10-CM | POA: Diagnosis not present

## 2019-03-13 NOTE — Telephone Encounter (Signed)
Patient states that her hands shake too bad for her to be able to put on the heart monitor. She was wondering if it were possible for her to come into the office to have help doing this. She gets her second covid vaccine next week and has tested negative for covid when she was at the hospital. wells

## 2019-03-17 ENCOUNTER — Other Ambulatory Visit: Payer: Self-pay | Admitting: Physician Assistant

## 2019-03-17 ENCOUNTER — Ambulatory Visit (INDEPENDENT_AMBULATORY_CARE_PROVIDER_SITE_OTHER): Payer: Medicare HMO

## 2019-03-17 ENCOUNTER — Other Ambulatory Visit: Payer: Self-pay

## 2019-03-17 DIAGNOSIS — G459 Transient cerebral ischemic attack, unspecified: Secondary | ICD-10-CM

## 2019-03-17 DIAGNOSIS — I4891 Unspecified atrial fibrillation: Secondary | ICD-10-CM

## 2019-03-18 DIAGNOSIS — R7989 Other specified abnormal findings of blood chemistry: Secondary | ICD-10-CM | POA: Diagnosis not present

## 2019-03-18 DIAGNOSIS — R946 Abnormal results of thyroid function studies: Secondary | ICD-10-CM | POA: Diagnosis not present

## 2019-03-21 ENCOUNTER — Ambulatory Visit: Payer: Medicare HMO | Attending: Internal Medicine

## 2019-03-21 DIAGNOSIS — Z23 Encounter for immunization: Secondary | ICD-10-CM | POA: Insufficient documentation

## 2019-03-21 NOTE — Progress Notes (Signed)
   Covid-19 Vaccination Clinic  Name:  Valerie Wood    MRN: [id number] DOB: August 23, 1945  03/21/2019  [name] was observed post Covid-19 immunization for 15 minutes without incidence. She was provided with Vaccine Information Sheet and instruction to access the V-Safe system.   [name] was instructed to call 911 with any severe reactions post vaccine: Mar[place] Kitchen Difficulty breathing  . Swelling of your face and throat  . A fast heartbeat  . A bad rash all over your body  . Dizziness and weakness    Immunizations Administered    Name Date Dose VIS Date Route   Pfizer COVID-19 Vaccine 03/21/2019  3:16 PM 0.3 mL 01/17/2019 Intramuscular   Manufacturer: [employer]   Lot: [id number]   [place]: [id number]

## 2019-04-01 DIAGNOSIS — H40013 Open angle with borderline findings, low risk, bilateral: Secondary | ICD-10-CM | POA: Diagnosis not present

## 2019-04-04 ENCOUNTER — Ambulatory Visit: Payer: Medicare HMO

## 2019-04-09 ENCOUNTER — Encounter: Payer: Self-pay | Admitting: Adult Health

## 2019-04-09 ENCOUNTER — Other Ambulatory Visit: Payer: Self-pay

## 2019-04-09 ENCOUNTER — Ambulatory Visit: Payer: Medicare HMO | Admitting: Adult Health

## 2019-04-09 VITALS — BP 146/90 | HR 90 | Temp 97.1°F | Ht 63.0 in | Wt 183.2 lb

## 2019-04-09 DIAGNOSIS — G459 Transient cerebral ischemic attack, unspecified: Secondary | ICD-10-CM

## 2019-04-09 DIAGNOSIS — G43109 Migraine with aura, not intractable, without status migrainosus: Secondary | ICD-10-CM

## 2019-04-09 DIAGNOSIS — E785 Hyperlipidemia, unspecified: Secondary | ICD-10-CM

## 2019-04-09 DIAGNOSIS — I10 Essential (primary) hypertension: Secondary | ICD-10-CM

## 2019-04-09 DIAGNOSIS — E119 Type 2 diabetes mellitus without complications: Secondary | ICD-10-CM | POA: Diagnosis not present

## 2019-04-09 NOTE — Progress Notes (Signed)
[facility] [address] [place]. [place] [address] [phone]       [place]. [facility] PFOST Date of Birth:  09/20/1945 Medical Record Number:  [id number]   Reason for Referral:  hospital stroke follow up    CHIEF COMPLAINT:  Chief Complaint  Patient presents with  . Hospitalization Follow-up    Alone. Treatment room. No new concerns at this time.     HPI: Valerie Wood being seen today for in office hospital follow-up regarding TIA versus complicated migraine on [id number].  History obtained from patient and chart review. Reviewed all radiology images and labs personally.  [name] is a 74 y.o. female with history of diabetes mellitus, COPD, hypertension, coronary artery disease, migraines  presented on 03/03/2019 with transient garbled speech and expressive aphasia following a migraine.   Evaluated by stroke team and Dr. Erlinda Hong with stroke work-up largely unremarkable with symptoms most likely complicated migraine however given stroke risk factors, TIA still in DDx.  Recommended 30-day cardiac event monitor outpatient to rule out A. fib due to symptoms of expressive aphasia.  Recommended DAPT for 3 weeks and aspirin alone as not previously on antithrombotic.  Prior history of migraine with visual aura with current episode concerning for complicated migraine with speech difficulty and advised to follow-up with PCP.  History of BPPV with episode during admission while laying flat for CT.  HTN stable.  LDL 52 and recommended continuation of home dose atorvastatin 40 mg daily.  Uncontrolled DM with A1c 7.9 and recommended close PCP follow-up.  Other stroke risk factors include advanced age, former tobacco use, obesity, CAD s/p NSTEMI and migraines with visual aura but no prior history of stroke.  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  No residual stroke  deficits but did recommend outpatient PT for BPPV and was discharged home in stable condition.  Valerie Wood is a 74 year old female who is being seen today for stroke follow up. She has been doing well since discharge without reoccurring speech concerns or headaches/migraines. Currently wearing cardiac monitor which will be completed 3/10. Completed 3 week DAPT and continues on aspirin 81mg  daily without bleeding or bruising. Continues on atorvastatin 40mg  daily without myalgias. Blood pressure today 158/81. Monitored at home and stable - elevates at doctors appointments. She has had difficulty with glucose control with recent read out range 80-180. Continues to follow with endocrinology routinely. No further concerns at this time.        ROS:   14 system review of systems performed and negative with exception of anxiety, numbness  PMH:  Past Medical History:  Diagnosis Date  . Anxiety   . Arthritis    hands, all over, back  . Atherosclerosis 2017  . 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 Bilateral dry eyes   . Cataract    Bilateral  . COPD (chronic obstructive pulmonary disease) ([place])   . Diabetes mellitus    pt didn't divulge DM during PAT interview but Dr. Paulene Floor nurse states that pt is type 2 diabetic, controlled by diet.  . Diverticulosis, sigmoid   . Duodenitis without mention of hemorrhage   . Enterocele    History of  . Esophagitis, unspecified   . Fibromyalgia   . Gastroparesis due to DM ([place])   . GERD (gastroesophageal reflux disease)   . Glaucoma   .  History of concussion   . Hx of colonic polyps   . Hyperlipidemia   . Hypertension   . L4-L5 disc bulge 03/2003  . Migraine   . Myocardial infarction [facility])    NSTEMI, patient states no one told her of this  . Osteopenia   . 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 PONV (postoperative nausea and vomiting)   . Rectocele    History of  . Right rib fracture 12/20/2013  . Seasonal allergies   . Shortness  of breath    quit smoking 2013  . Skin cancer    Nose  . Stricture and stenosis of esophagus   . Thyroid nodule    Bilateral  . Tobacco dependence   . Uterine cancer ([place]) 1989  . Vertigo   . Vitamin D deficiency     PSH:  Past Surgical History:  Procedure Laterality Date  . APPENDECTOMY    . BLADDER SURGERY     Tack   . CHOLECYSTECTOMY    . COLONOSCOPY  last one 11/09/2017   multiple  . DILATION AND EVACUATION    . ESOPHAGOGASTRODUODENOSCOPY  last one 12/06/2004   multiple  . LEEP    . PARATHYROIDECTOMY Right 03/15/2018   Procedure: RIGHT INFERIOR PARATHYROIDECTOMY;  Surgeon: Armandina Gemma, MD;  Location: [facility];  Service: General;  Laterality: Right;  . reclast     2019  . RECTOCELE REPAIR  05/11/2011   Procedure: POSTERIOR REPAIR (RECTOCELE);  Surgeon: Cheri Fowler, MD;  Location: [facility];  Service: Gynecology;  Laterality: N/A;  . TONSILLECTOMY    . TUBAL LIGATION    . VAGINAL HYSTERECTOMY  12/1987   BSO    Social History:  Social History   Socioeconomic History  . Marital status: Widowed    Spouse name: Not on file  . Number of children: 2  . Years of education: Not on file  . Highest education level: Not on file  Occupational History  . Occupation: Retired    Comment: Secondary school teacher  . Smoking status: Former Smoker    Packs/day: 1.00    Years: 40.00    Pack years: 40.00    Types: Cigarettes    Quit date: 02/22/2011    Years since quitting: 8.1  . Smokeless tobacco: Never Used  Substance and Sexual Activity  . Alcohol use: No  . Drug use: No  . Sexual activity: Not on file  Other Topics Concern  . Not on file  Social History Narrative   Caffeine daily    Social Determinants of Health   Financial Resource Strain:   . Difficulty of Paying Living Expenses: Not on file  Food Insecurity:   . Worried About Charity fundraiser in the Last Year: Not on file  . Ran Out of Food in the Last Year: Not on file  Transportation Needs:   . Lack of  Transportation (Medical): Not on file  . Lack of Transportation (Non-Medical): Not on file  Physical Activity:   . Days of Exercise per Week: Not on file  . Minutes of Exercise per Session: Not on file  Stress:   . Feeling of Stress : Not on file  Social Connections:   . Frequency of Communication with Friends and Family: Not on file  . Frequency of Social Gatherings with Friends and Family: Not on file  . Attends Religious Services: Not on file  . Active Member of Clubs or Organizations: Not on file  . Attends Archivist  Meetings: Not on file  . Marital Status: Not on file  Intimate Partner Violence:   . Fear of Current or Ex-Partner: Not on file  . Emotionally Abused: Not on file  . Physically Abused: Not on file  . Sexually Abused: Not on file    Family History:  Family History  Problem Relation Age of Onset  . Colon cancer Father 11  . Stomach cancer Father   . Heart disease Mother        MI, CVA  . Rectal cancer Neg Hx   . Esophageal cancer Neg Hx     Medications:   Current Outpatient Medications on File Prior to Visit  Medication Sig Dispense Refill  . Accu-Chek FastClix Lancets MISC     . ACCU-CHEK SMARTVIEW test strip     . aspirin EC 81 MG tablet Take 81 mg by mouth daily.    Marland Kitchen atorvastatin (LIPITOR) 40 MG tablet Take 40 mg by mouth at bedtime.     . BD VEO INSULIN SYRINGE U/F 31G X 15/64" 0.5 ML MISC     . Biotin 5 MG TABS Take 5 mg by mouth at bedtime.     . celecoxib (CELEBREX) 200 MG capsule Take 1 capsule (200 mg total) by mouth 2 (two) times daily. (Patient taking differently: Take 200 mg by mouth at bedtime. ) 180 capsule 2  . cetirizine (ZYRTEC) 10 MG tablet Take 10 mg by mouth at bedtime.     . cholecalciferol (VITAMIN D) 1000 UNITS tablet Take 1,000 Units by mouth at bedtime.     . fluticasone (FLONASE) 50 MCG/ACT nasal spray Place 2 sprays into both nostrils daily as needed for allergies or rhinitis.    Marland Kitchen insulin NPH-regular Human (NOVOLIN  70/30) (70-30) 100 UNIT/ML injection Inject 40-50 Units into the skin 2 (two) times daily. Inject 40 units subcutaneously in the morning and 50 units at supper    . losartan (COZAAR) 50 MG tablet Take 50 mg by mouth at bedtime.     . Multiple Vitamin (MULTIVITAMIN WITH MINERALS) TABS tablet Take 1 tablet by mouth at bedtime.     Marland Kitchen omeprazole (PRILOSEC) 20 MG capsule Take 20 mg by mouth at bedtime.     Marland Kitchen RELION PEN NEEDLES 32G X 4 MM MISC     . SYMBICORT 80-4.5 MCG/ACT inhaler Inhale 2 puffs into the lungs daily.     No current facility-administered medications on file prior to visit.    Allergies:   Allergies  Allergen Reactions  . Canagliflozin Itching and Nausea And Vomiting    Yeast infection  . Macrolides And Ketolides Nausea And Vomiting  . Morphine And Related Nausea And Vomiting  . Tetracyclines & Related Itching and Nausea And Vomiting  . Tramadol Nausea And Vomiting  . Doxycycline Hyclate     Stomach upset  . Gabapentin     Felt bad  . Glipizide Diarrhea  . Iloperidone Dermatitis  . Januvia [Sitagliptin] Nausea And Vomiting  . Macrobid [Nitrofurantoin Macrocrystal] Nausea And Vomiting  . Metformin   . Nitrofurantoin     n/v  . Pantoprazole Nausea And Vomiting  . Symbicort [Budesonide-Formoterol Fumarate]     Thrush   . Victoza [Liraglutide] Nausea And Vomiting  . Ciprofloxacin Hcl Rash     Physical Exam  Vitals:   04/09/19 0950  BP: (!) 158/81  Pulse: 90  Temp: (!) 97.1 F (36.2 C)  TempSrc: Oral  Weight: 183 lb 3.2 oz (83.1 kg)  Height: 5\' 3"  (  1.6 m)   Body mass index is 32.45 kg/m. No exam data present  No flowsheet data found.   General: well developed, well nourished, anxious pleasant elderly Caucasian female, seated, in no evident distress Head: head normocephalic and atraumatic.   Neck: supple with no carotid or supraclavicular bruits Cardiovascular: regular rate and rhythm, no murmurs Musculoskeletal: no deformity Skin:  no  rash/petichiae Vascular:  Normal pulses all extremities   Neurologic Exam Mental Status: Awake and fully alert. normal speech and language. Oriented to place and time. Recent and remote memory intact. Attention span, concentration and fund of knowledge appropriate. Mood and affect appropriate.  Cranial Nerves: Fundoscopic exam reveals sharp disc margins. Pupils equal, briskly reactive to light. Extraocular movements full without nystagmus. Visual fields full to confrontation. Hearing intact. Facial sensation intact. Face, tongue, palate moves normally and symmetrically.  Motor: Normal bulk and tone. Normal strength in all tested extremity muscles. Sensory.: intact to touch , pinprick , position and vibratory sensation.  Coordination: Rapid alternating movements normal in all extremities. Finger-to-nose and heel-to-shin performed accurately bilaterally. Gait and Station: Arises from chair without difficulty. Stance is normal. Gait demonstrates normal stride length and balance Reflexes: 1+ and symmetric. Toes downgoing.     NIHSS  0 Modified Rankin  0    Diagnostic Data (Labs, Imaging, Testing)   CT head No acute abnormality.  Small vessel disease.     MRI  No acute abnormality.  Small vessel disease.     CTA head B ICA plaques w/ mild/mod narrowing R ICA  CTA neck minimal plaque R ICA bifurcation  2D Echo EF 50-55%. No source of embolus   Recommend 30 day cardiac event monitoring as outpt to rule out afib  LDL 52  HgbA1c 7.9    ASSESSMENT: Valerie Wood is a 74 y.o. year old female presented with transient garbled speech and expressive aphasia following a migraine on 03/03/2019 most likely complicated migraine versus TIA given risk factors.  Recommended 30-day cardiac event monitor to rule out A. fib. Vascular risk factors include HTN, HLD, DM, former tobacco use, obesity, CAD s/p NSTEMI, BPPV and migraines with visual aura. Has been stable from neurological standpoint.      PLAN:  1. TIA: Continue aspirin 81 mg daily  and atorvastatin for secondary stroke prevention. Maintain strict control of hypertension with blood pressure goal below 130/90, diabetes with hemoglobin A1c goal below 6.5% and cholesterol with LDL cholesterol (bad cholesterol) goal below 70 mg/dL.  I also advised the patient to eat a healthy diet with plenty of whole grains, cereals, fruits and vegetables, exercise regularly with at least 30 minutes of continuous activity daily and maintain ideal body weight. Complete 30 day cardiac monitor to rule out atrial fibrillation.  2. Complicated migraines: no reoccurrence since discharge. Continue to follow with PCP 3. HTN: Advised to continue current treatment regimen.  Advised to continue to monitor at home along with continued follow-up with PCP for management 4. HLD: Advised to continue current treatment regimen along with continued follow-up with PCP for future prescribing and monitoring of lipid panel 5. DMII: Advised to continue to monitor glucose levels at home along with continued follow-up with PCP for management and monitoring    Follow up in 4 months or call earlier if needed   Greater than 50% of time during this 45 minute visit was spent on counseling, explanation of diagnosis of complicated migraine versus TIA, reviewing risk factor management of HTN, HLD and DM, planning of further  management along with potential future management, and discussion with patient answering all questions to satisfaction     Frann Rider, [facility]  [facility] [address] [place], [place] [address]  Phone [phone] Fax [phone] Note: This document was prepared with digital dictation and possible smart phrase technology. Any transcriptional errors that result from this process are unintentional.

## 2019-04-09 NOTE — Progress Notes (Signed)
I agree with the above plan 

## 2019-04-09 NOTE — Patient Instructions (Signed)
Continue aspirin 81 mg daily  and atorvastatin  for secondary stroke prevention  Continue to follow up with PCP regarding cholesterol, blood pressure and diabetes management   Complete 30 day cardiac monitor to rule out atrial fibrillation  Continue to monitor blood pressure at home  Maintain strict control of hypertension with blood pressure goal below 130/90, diabetes with hemoglobin A1c goal below 6.5% and cholesterol with LDL cholesterol (bad cholesterol) goal below 70 mg/dL. I also advised the patient to eat a healthy diet with plenty of whole grains, cereals, fruits and vegetables, exercise regularly and maintain ideal body weight.  Followup in the future with me in 4 months or call earlier if needed       Thank you for coming to see [place] at [facility]. I hope we have been able to provide you high quality care today.  You may receive a patient satisfaction survey over the next few weeks. We would appreciate your feedback and comments so that we may continue to improve ourselves and the health of our patients.

## 2019-04-28 ENCOUNTER — Telehealth: Payer: Self-pay

## 2019-04-28 NOTE — Telephone Encounter (Signed)
Pt has been r/s  

## 2019-04-28 NOTE — Telephone Encounter (Signed)
Voicemail message left at 9:12a:  Patient would like a call back to reschedule her 08/20/19 appt because she will be on vacation that week.

## 2019-05-28 DIAGNOSIS — R300 Dysuria: Secondary | ICD-10-CM | POA: Diagnosis not present

## 2019-05-28 DIAGNOSIS — I10 Essential (primary) hypertension: Secondary | ICD-10-CM | POA: Diagnosis not present

## 2019-05-28 DIAGNOSIS — Z794 Long term (current) use of insulin: Secondary | ICD-10-CM | POA: Diagnosis not present

## 2019-05-28 DIAGNOSIS — E1169 Type 2 diabetes mellitus with other specified complication: Secondary | ICD-10-CM | POA: Diagnosis not present

## 2019-06-26 DIAGNOSIS — G4700 Insomnia, unspecified: Secondary | ICD-10-CM | POA: Diagnosis not present

## 2019-06-26 DIAGNOSIS — E039 Hypothyroidism, unspecified: Secondary | ICD-10-CM | POA: Diagnosis not present

## 2019-06-26 DIAGNOSIS — E669 Obesity, unspecified: Secondary | ICD-10-CM | POA: Diagnosis not present

## 2019-06-26 DIAGNOSIS — E7800 Pure hypercholesterolemia, unspecified: Secondary | ICD-10-CM | POA: Diagnosis not present

## 2019-06-26 DIAGNOSIS — M1990 Unspecified osteoarthritis, unspecified site: Secondary | ICD-10-CM | POA: Diagnosis not present

## 2019-06-26 DIAGNOSIS — E1169 Type 2 diabetes mellitus with other specified complication: Secondary | ICD-10-CM | POA: Diagnosis not present

## 2019-06-26 DIAGNOSIS — I10 Essential (primary) hypertension: Secondary | ICD-10-CM | POA: Diagnosis not present

## 2019-06-30 DIAGNOSIS — R110 Nausea: Secondary | ICD-10-CM | POA: Diagnosis not present

## 2019-06-30 DIAGNOSIS — K439 Ventral hernia without obstruction or gangrene: Secondary | ICD-10-CM | POA: Diagnosis not present

## 2019-07-30 DIAGNOSIS — H40013 Open angle with borderline findings, low risk, bilateral: Secondary | ICD-10-CM | POA: Diagnosis not present

## 2019-07-30 DIAGNOSIS — H524 Presbyopia: Secondary | ICD-10-CM | POA: Diagnosis not present

## 2019-07-30 DIAGNOSIS — E1136 Type 2 diabetes mellitus with diabetic cataract: Secondary | ICD-10-CM | POA: Diagnosis not present

## 2019-07-30 DIAGNOSIS — Z794 Long term (current) use of insulin: Secondary | ICD-10-CM | POA: Diagnosis not present

## 2019-07-30 DIAGNOSIS — H04123 Dry eye syndrome of bilateral lacrimal glands: Secondary | ICD-10-CM | POA: Diagnosis not present

## 2019-07-30 DIAGNOSIS — H2513 Age-related nuclear cataract, bilateral: Secondary | ICD-10-CM | POA: Diagnosis not present

## 2019-07-30 DIAGNOSIS — H5213 Myopia, bilateral: Secondary | ICD-10-CM | POA: Diagnosis not present

## 2019-08-12 DIAGNOSIS — R300 Dysuria: Secondary | ICD-10-CM | POA: Diagnosis not present

## 2019-08-20 ENCOUNTER — Ambulatory Visit: Payer: Medicare HMO | Admitting: Adult Health

## 2019-08-25 DIAGNOSIS — M79672 Pain in left foot: Secondary | ICD-10-CM | POA: Diagnosis not present

## 2019-08-25 DIAGNOSIS — M7731 Calcaneal spur, right foot: Secondary | ICD-10-CM | POA: Diagnosis not present

## 2019-08-25 DIAGNOSIS — M79671 Pain in right foot: Secondary | ICD-10-CM | POA: Diagnosis not present

## 2019-08-25 DIAGNOSIS — G5761 Lesion of plantar nerve, right lower limb: Secondary | ICD-10-CM | POA: Diagnosis not present

## 2019-08-25 DIAGNOSIS — M722 Plantar fascial fibromatosis: Secondary | ICD-10-CM | POA: Diagnosis not present

## 2019-08-25 DIAGNOSIS — M7732 Calcaneal spur, left foot: Secondary | ICD-10-CM | POA: Diagnosis not present

## 2019-08-26 ENCOUNTER — Ambulatory Visit: Payer: Medicare HMO | Admitting: Adult Health

## 2019-08-26 ENCOUNTER — Other Ambulatory Visit: Payer: Self-pay

## 2019-08-26 ENCOUNTER — Encounter: Payer: Self-pay | Admitting: Adult Health

## 2019-08-26 VITALS — BP 151/72 | HR 71 | Ht 62.0 in | Wt 180.0 lb

## 2019-08-26 DIAGNOSIS — E119 Type 2 diabetes mellitus without complications: Secondary | ICD-10-CM

## 2019-08-26 DIAGNOSIS — G459 Transient cerebral ischemic attack, unspecified: Secondary | ICD-10-CM | POA: Diagnosis not present

## 2019-08-26 DIAGNOSIS — E785 Hyperlipidemia, unspecified: Secondary | ICD-10-CM | POA: Diagnosis not present

## 2019-08-26 DIAGNOSIS — G43109 Migraine with aura, not intractable, without status migrainosus: Secondary | ICD-10-CM | POA: Diagnosis not present

## 2019-08-26 DIAGNOSIS — I10 Essential (primary) hypertension: Secondary | ICD-10-CM

## 2019-08-26 NOTE — Progress Notes (Signed)
[facility] [address] [place]. [place] [address] [phone]       OFFICE FOLLOW UP NOTE  [name] Date of Birth:  1946/01/26 Medical Record Number:  [id number]   Reason for Referral: TIA vs complicated migraine follow up    CHIEF COMPLAINT:  Chief Complaint  Patient presents with  . Follow-up    rm 8 here for a f/u on a TIA    HPI:   Today, 08/26/2019, Valerie Wood is being seen for TIA follow-up.  Stable since prior visit with new or reoccurring stroke/TIA symptoms.  Denies reoccurring migraines or symptoms.  30-day cardiac event monitor negative for atrial fibrillation.  Remains on aspirin and atorvastatin without side effects.  Blood pressure today 151/72.  Continues to follow with endocrinology for DM management.  No stroke/neurological concerns at this time.   History provided for reference purposes only Initial visit 04/09/2019 JM: Valerie Wood is a 74 year old female who is being seen today for stroke follow up. She has been doing well since discharge without reoccurring speech concerns or headaches/migraines. Currently wearing cardiac monitor which will be completed 3/10. Completed 3 week DAPT and continues on aspirin 81mg  daily without bleeding or bruising. Continues on atorvastatin 40mg  daily without myalgias. Blood pressure today 158/81. Monitored at home and stable - elevates at doctors appointments. She has had difficulty with glucose control with recent read out range 80-180. Continues to follow with endocrinology routinely. No further concerns at this time.   Hospital admission 03/03/2019: Valerie Wood is a 74 y.o. female with history of diabetes mellitus, COPD, hypertension, coronary artery disease, migraines  presented on 03/03/2019 with transient garbled speech and expressive aphasia following a migraine.   Evaluated by stroke team and Dr. Erlinda Hong with stroke work-up largely unremarkable with symptoms most likely complicated migraine however given  stroke risk factors, TIA still in DDx.  Recommended 30-day cardiac event monitor outpatient to rule out A. fib due to symptoms of expressive aphasia.  Recommended DAPT for 3 weeks and aspirin alone as not previously on antithrombotic.  Prior history of migraine with visual aura with current episode concerning for complicated migraine with speech difficulty and advised to follow-up with PCP.  History of BPPV with episode during admission while laying flat for CT.  HTN stable.  LDL 52 and recommended continuation of home dose atorvastatin 40 mg daily.  Uncontrolled DM with A1c 7.9 and recommended close PCP follow-up.  Other stroke risk factors include advanced age, former tobacco use, obesity, CAD s/p NSTEMI and migraines with visual aura but no prior history of stroke.  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  No residual stroke deficits but did recommend outpatient PT for BPPV and was discharged home in stable condition.    ROS:   14 system review of systems performed and negative with exception of no complaints  PMH:  Past Medical History:  Diagnosis Date  . Anxiety   . Arthritis    hands, all over, back  . Atherosclerosis 2017  . 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 Bilateral dry eyes   . Cataract    Bilateral  . COPD (chronic obstructive pulmonary disease) ([place])   . Diabetes mellitus    pt didn't divulge DM during PAT interview but Dr. Paulene Floor nurse states that pt is type 2 diabetic, controlled by diet.  . Diverticulosis, sigmoid   .  Duodenitis without mention of hemorrhage   . Enterocele    History of  . Esophagitis, unspecified   . Fibromyalgia   . Gastroparesis due to DM ([place])   . GERD (gastroesophageal reflux disease)   . Glaucoma   . History of concussion   . Hx of colonic polyps   . Hyperlipidemia   . Hypertension   . L4-L5 disc bulge 03/2003  . Migraine    . Myocardial infarction [facility])    NSTEMI, patient states no one told her of this  . Osteopenia   . 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 PONV (postoperative nausea and vomiting)   . Rectocele    History of  . Right rib fracture 12/20/2013  . Seasonal allergies   . Shortness of breath    quit smoking 2013  . Skin cancer    Nose  . Stricture and stenosis of esophagus   . Thyroid nodule    Bilateral  . Tobacco dependence   . Uterine cancer ([place]) 1989  . Vertigo   . Vitamin D deficiency     PSH:  Past Surgical History:  Procedure Laterality Date  . APPENDECTOMY    . BLADDER SURGERY     Tack   . CHOLECYSTECTOMY    . COLONOSCOPY  last one 11/09/2017   multiple  . DILATION AND EVACUATION    . ESOPHAGOGASTRODUODENOSCOPY  last one 12/06/2004   multiple  . LEEP    . PARATHYROIDECTOMY Right 03/15/2018   Procedure: RIGHT INFERIOR PARATHYROIDECTOMY;  Surgeon: Armandina Gemma, MD;  Location: [facility];  Service: General;  Laterality: Right;  . reclast     2019  . RECTOCELE REPAIR  05/11/2011   Procedure: POSTERIOR REPAIR (RECTOCELE);  Surgeon: Cheri Fowler, MD;  Location: [place] [facility];  Service: Gynecology;  Laterality: N/A;  . TONSILLECTOMY    . TUBAL LIGATION    . VAGINAL HYSTERECTOMY  12/1987   BSO    Social History:  Social History   Socioeconomic History  . Marital status: Widowed    Spouse name: Not on file  . Number of children: 2  . Years of education: Not on file  . Highest education level: Not on file  Occupational History  . Occupation: Retired    Comment: Secondary school teacher  . Smoking status: Former Smoker    Packs/day: 1.00    Years: 40.00    Pack years: 40.00    Types: Cigarettes    Quit date: 02/22/2011    Years since quitting: 8.5  . Smokeless tobacco: Never Used  Vaping Use  . Vaping Use: Never used  Substance and Sexual Activity  . Alcohol use: No  . Drug use: No  . Sexual activity: Not on file  Other Topics Concern  . Not on file  Social History  Narrative   Caffeine daily    Social Determinants of Health   Financial Resource Strain:   . Difficulty of Paying Living Expenses:   Food Insecurity:   . Worried About Charity fundraiser in the Last Year:   . Arboriculturist in the Last Year:   Transportation Needs:   . Film/video editor (Medical):   Marland Kitchen Lack of Transportation (Non-Medical):   Physical Activity:   . Days of Exercise per Week:   . Minutes of Exercise per Session:   Stress:   . Feeling of Stress :   Social Connections:   . Frequency of Communication with Friends and Family:   . Frequency  of Social Gatherings with Friends and Family:   . Attends Religious Services:   . Active Member of Clubs or Organizations:   . Attends Archivist Meetings:   Marland Kitchen Marital Status:   Intimate Partner Violence:   . Fear of Current or Ex-Partner:   . Emotionally Abused:   Marland Kitchen Physically Abused:   . Sexually Abused:     Family History:  Family History  Problem Relation Age of Onset  . Colon cancer Father 37  . Stomach cancer Father   . Heart disease Mother        MI, CVA  . Rectal cancer Neg Hx   . Esophageal cancer Neg Hx     Medications:   Current Outpatient Medications on File Prior to Visit  Medication Sig Dispense Refill  . Accu-Chek FastClix Lancets MISC     . ACCU-CHEK SMARTVIEW test strip     . aspirin EC 81 MG tablet Take 81 mg by mouth daily.    Marland Kitchen atorvastatin (LIPITOR) 40 MG tablet Take 40 mg by mouth at bedtime.     . BD VEO INSULIN SYRINGE U/F 31G X 15/64" 0.5 ML MISC     . Biotin 5 MG TABS Take 5 mg by mouth at bedtime.     . celecoxib (CELEBREX) 200 MG capsule Take 1 capsule (200 mg total) by mouth 2 (two) times daily. (Patient taking differently: Take 200 mg by mouth at bedtime. ) 180 capsule 2  . cetirizine (ZYRTEC) 10 MG tablet Take 10 mg by mouth at bedtime.     . cholecalciferol (VITAMIN D) 1000 UNITS tablet Take 1,000 Units by mouth at bedtime.     . fluticasone (FLONASE) 50 MCG/ACT nasal  spray Place 2 sprays into both nostrils daily as needed for allergies or rhinitis.    Marland Kitchen insulin NPH-regular Human (NOVOLIN 70/30) (70-30) 100 UNIT/ML injection Inject 40-50 Units into the skin 2 (two) times daily. Inject 40 units subcutaneously in the morning and 50 units at supper    . losartan (COZAAR) 50 MG tablet Take 50 mg by mouth at bedtime.     . Multiple Vitamin (MULTIVITAMIN WITH MINERALS) TABS tablet Take 1 tablet by mouth at bedtime.     Marland Kitchen omeprazole (PRILOSEC) 20 MG capsule Take 20 mg by mouth at bedtime.     Marland Kitchen RELION PEN NEEDLES 32G X 4 MM MISC     . SYMBICORT 80-4.5 MCG/ACT inhaler Inhale 2 puffs into the lungs daily.     No current facility-administered medications on file prior to visit.    Allergies:   Allergies  Allergen Reactions  . Canagliflozin Itching and Nausea And Vomiting    Yeast infection  . Macrolides And Ketolides Nausea And Vomiting  . Morphine And Related Nausea And Vomiting  . Tetracyclines & Related Itching and Nausea And Vomiting  . Tramadol Nausea And Vomiting  . Doxycycline Hyclate     Stomach upset  . Gabapentin     Felt bad  . Glipizide Diarrhea  . Iloperidone Dermatitis  . Januvia [Sitagliptin] Nausea And Vomiting  . Macrobid [Nitrofurantoin Macrocrystal] Nausea And Vomiting  . Metformin   . Nitrofurantoin     n/v  . Pantoprazole Nausea And Vomiting  . Symbicort [Budesonide-Formoterol Fumarate]     Thrush   . Victoza [Liraglutide] Nausea And Vomiting  . Ciprofloxacin Hcl Rash     Physical Exam  Vitals:   08/26/19 1450  BP: (!) 151/72  Pulse: 71  Weight: 180 lb (81.6  kg)  Height: 5\' 2"  (1.575 m)   Body mass index is 32.92 kg/m. No exam data present  No flowsheet data found.   General: well developed, well nourished, pleasant elderly Caucasian female, seated, in no evident distress Head: head normocephalic and atraumatic.   Neck: supple with no carotid or supraclavicular bruits Cardiovascular: regular rate and rhythm, no  murmurs Musculoskeletal: no deformity Skin:  no rash/petichiae Vascular:  Normal pulses all extremities   Neurologic Exam Mental Status: Awake and fully alert. normal speech and language. Oriented to place and time. Recent and remote memory intact. Attention span, concentration and fund of knowledge appropriate. Mood and affect appropriate.  Cranial Nerves: Pupils equal, briskly reactive to light. Extraocular movements full without nystagmus. Visual fields full to confrontation. Hearing intact. Facial sensation intact. Face, tongue, palate moves normally and symmetrically.  Motor: Normal bulk and tone. Normal strength in all tested extremity muscles. Sensory.: intact to touch , pinprick , position and vibratory sensation.  Coordination: Rapid alternating movements normal in all extremities. Finger-to-nose and heel-to-shin performed accurately bilaterally. Gait and Station: Arises from chair without difficulty. Stance is normal. Gait demonstrates normal stride length and balance Reflexes: 1+ and symmetric. Toes downgoing.        ASSESSMENT/PLAN: Valerie Wood is a 74 y.o. year old female presented with transient garbled speech and expressive aphasia following a migraine on 03/03/2019 most likely complicated migraine versus TIA given risk factors.  Recommended 30-day cardiac event monitor to rule out A. fib. Vascular risk factors include HTN, HLD, DM, former tobacco use, obesity, CAD s/p NSTEMI, BPPV and migraines with visual aura.     1. TIA:  -Continue aspirin 81 mg daily  and atorvastatin for secondary stroke prevention.  -Cardiac event monitor negative atrial fibrillation -Ensure close PCP and endocrinology follow-up for aggressive stroke risk factor management 2. Complicated migraines: no reoccurrence since discharge.  Continue to monitor 3. HTN: BP goal<130/90.  Stable today.  Continue to monitor at home and follow-up with PCP 4. HLD: LDL goal<70.  Continue atorvastatin and ensure  close PCP follow-up for prescribing, monitoring and management 5. DMII: A1c goal<7.  Follow-up with endocrinology for ongoing diabetic monitoring management    Follow-up in 6 months or call earlier if needed   I spent 20 minutes of face-to-face and non-face-to-face time with patient.  This included previsit chart review, lab review, study review, order entry, electronic health record documentation, patient education regarding history of TIA vs complicated migraine, importance of managing stroke risk factors and answered all questions to patient satisfaction   Frann Rider, [facility]  [facility] [address] [place], [place] [address]  Phone [phone] Fax [phone] Note: This document was prepared with digital dictation and possible smart phrase technology. Any transcriptional errors that result from this process are unintentional.

## 2019-08-26 NOTE — Progress Notes (Signed)
I agree with the above plan 

## 2019-08-26 NOTE — Patient Instructions (Signed)
Continue aspirin 81 mg daily  and atorvastatin 40 mg for secondary stroke prevention  Continue to follow up with PCP regarding cholesterol and blood pressure management   Continue to follow with endocrinology for diabetic management and monitoring.  Prior A1c in 02/2019 7.9  Continue to follow with PCP regarding insomnia with increased stressors likely contributing   Maintain strict control of hypertension with blood pressure goal below 130/90, diabetes with hemoglobin A1c goal below 6.5% and cholesterol with LDL cholesterol (bad cholesterol) goal below 70 mg/dL. I also advised the patient to eat a healthy diet with plenty of whole grains, cereals, fruits and vegetables, exercise regularly and maintain ideal body weight.  Followup in the future with me in 6 months or call earlier if needed       Thank you for coming to see [place] at [facility]. I hope we have been able to provide you high quality care today.  You may receive a patient satisfaction survey over the next few weeks. We would appreciate your feedback and comments so that we may continue to improve ourselves and the health of our patients.

## 2019-09-08 DIAGNOSIS — M71571 Other bursitis, not elsewhere classified, right ankle and foot: Secondary | ICD-10-CM | POA: Diagnosis not present

## 2019-09-08 DIAGNOSIS — M722 Plantar fascial fibromatosis: Secondary | ICD-10-CM | POA: Diagnosis not present

## 2019-09-29 DIAGNOSIS — M722 Plantar fascial fibromatosis: Secondary | ICD-10-CM | POA: Diagnosis not present

## 2019-09-29 DIAGNOSIS — M71571 Other bursitis, not elsewhere classified, right ankle and foot: Secondary | ICD-10-CM | POA: Diagnosis not present

## 2019-10-07 DIAGNOSIS — M810 Age-related osteoporosis without current pathological fracture: Secondary | ICD-10-CM | POA: Diagnosis not present

## 2019-10-07 DIAGNOSIS — Z1231 Encounter for screening mammogram for malignant neoplasm of breast: Secondary | ICD-10-CM | POA: Diagnosis not present

## 2019-10-07 DIAGNOSIS — Z794 Long term (current) use of insulin: Secondary | ICD-10-CM | POA: Diagnosis not present

## 2019-10-07 DIAGNOSIS — K219 Gastro-esophageal reflux disease without esophagitis: Secondary | ICD-10-CM | POA: Diagnosis not present

## 2019-10-07 DIAGNOSIS — I10 Essential (primary) hypertension: Secondary | ICD-10-CM | POA: Diagnosis not present

## 2019-10-07 DIAGNOSIS — E119 Type 2 diabetes mellitus without complications: Secondary | ICD-10-CM | POA: Diagnosis not present

## 2019-10-07 DIAGNOSIS — E669 Obesity, unspecified: Secondary | ICD-10-CM | POA: Diagnosis not present

## 2019-10-07 DIAGNOSIS — Z8639 Personal history of other endocrine, nutritional and metabolic disease: Secondary | ICD-10-CM | POA: Diagnosis not present

## 2019-10-07 DIAGNOSIS — E041 Nontoxic single thyroid nodule: Secondary | ICD-10-CM | POA: Diagnosis not present

## 2019-10-23 DIAGNOSIS — R350 Frequency of micturition: Secondary | ICD-10-CM | POA: Diagnosis not present

## 2019-10-23 DIAGNOSIS — E1169 Type 2 diabetes mellitus with other specified complication: Secondary | ICD-10-CM | POA: Diagnosis not present

## 2019-10-24 DIAGNOSIS — H1089 Other conjunctivitis: Secondary | ICD-10-CM | POA: Diagnosis not present

## 2019-11-12 DIAGNOSIS — R300 Dysuria: Secondary | ICD-10-CM | POA: Diagnosis not present

## 2019-11-12 DIAGNOSIS — R319 Hematuria, unspecified: Secondary | ICD-10-CM | POA: Diagnosis not present

## 2020-01-05 DIAGNOSIS — H10503 Unspecified blepharoconjunctivitis, bilateral: Secondary | ICD-10-CM | POA: Diagnosis not present

## 2020-01-19 DIAGNOSIS — E039 Hypothyroidism, unspecified: Secondary | ICD-10-CM | POA: Diagnosis not present

## 2020-01-19 DIAGNOSIS — N3281 Overactive bladder: Secondary | ICD-10-CM | POA: Diagnosis not present

## 2020-01-19 DIAGNOSIS — E1169 Type 2 diabetes mellitus with other specified complication: Secondary | ICD-10-CM | POA: Diagnosis not present

## 2020-01-19 DIAGNOSIS — E7800 Pure hypercholesterolemia, unspecified: Secondary | ICD-10-CM | POA: Diagnosis not present

## 2020-01-19 DIAGNOSIS — K219 Gastro-esophageal reflux disease without esophagitis: Secondary | ICD-10-CM | POA: Diagnosis not present

## 2020-01-19 DIAGNOSIS — N390 Urinary tract infection, site not specified: Secondary | ICD-10-CM | POA: Diagnosis not present

## 2020-01-19 DIAGNOSIS — M797 Fibromyalgia: Secondary | ICD-10-CM | POA: Diagnosis not present

## 2020-01-19 DIAGNOSIS — I10 Essential (primary) hypertension: Secondary | ICD-10-CM | POA: Diagnosis not present

## 2020-01-19 DIAGNOSIS — J42 Unspecified chronic bronchitis: Secondary | ICD-10-CM | POA: Diagnosis not present

## 2020-01-19 DIAGNOSIS — Z0000 Encounter for general adult medical examination without abnormal findings: Secondary | ICD-10-CM | POA: Diagnosis not present

## 2020-01-19 DIAGNOSIS — G4700 Insomnia, unspecified: Secondary | ICD-10-CM | POA: Diagnosis not present

## 2020-01-19 DIAGNOSIS — E210 Primary hyperparathyroidism: Secondary | ICD-10-CM | POA: Diagnosis not present

## 2020-01-19 DIAGNOSIS — F411 Generalized anxiety disorder: Secondary | ICD-10-CM | POA: Diagnosis not present

## 2020-01-19 DIAGNOSIS — M1990 Unspecified osteoarthritis, unspecified site: Secondary | ICD-10-CM | POA: Diagnosis not present

## 2020-02-10 DIAGNOSIS — H40013 Open angle with borderline findings, low risk, bilateral: Secondary | ICD-10-CM | POA: Diagnosis not present

## 2020-02-26 ENCOUNTER — Ambulatory Visit: Payer: Medicare HMO | Admitting: Adult Health

## 2020-03-15 DIAGNOSIS — R109 Unspecified abdominal pain: Secondary | ICD-10-CM | POA: Diagnosis not present

## 2020-03-15 DIAGNOSIS — N390 Urinary tract infection, site not specified: Secondary | ICD-10-CM | POA: Diagnosis not present

## 2020-03-22 DIAGNOSIS — M5136 Other intervertebral disc degeneration, lumbar region: Secondary | ICD-10-CM | POA: Diagnosis not present

## 2020-03-22 DIAGNOSIS — M7918 Myalgia, other site: Secondary | ICD-10-CM | POA: Diagnosis not present

## 2020-03-22 DIAGNOSIS — M47816 Spondylosis without myelopathy or radiculopathy, lumbar region: Secondary | ICD-10-CM | POA: Diagnosis not present

## 2020-03-22 DIAGNOSIS — M25551 Pain in right hip: Secondary | ICD-10-CM | POA: Diagnosis not present

## 2020-04-02 DIAGNOSIS — R300 Dysuria: Secondary | ICD-10-CM | POA: Diagnosis not present

## 2020-04-06 DIAGNOSIS — Z8639 Personal history of other endocrine, nutritional and metabolic disease: Secondary | ICD-10-CM | POA: Diagnosis not present

## 2020-04-06 DIAGNOSIS — K219 Gastro-esophageal reflux disease without esophagitis: Secondary | ICD-10-CM | POA: Diagnosis not present

## 2020-04-06 DIAGNOSIS — I10 Essential (primary) hypertension: Secondary | ICD-10-CM | POA: Diagnosis not present

## 2020-04-06 DIAGNOSIS — E119 Type 2 diabetes mellitus without complications: Secondary | ICD-10-CM | POA: Diagnosis not present

## 2020-04-06 DIAGNOSIS — M810 Age-related osteoporosis without current pathological fracture: Secondary | ICD-10-CM | POA: Diagnosis not present

## 2020-04-06 DIAGNOSIS — E669 Obesity, unspecified: Secondary | ICD-10-CM | POA: Diagnosis not present

## 2020-04-06 DIAGNOSIS — Z794 Long term (current) use of insulin: Secondary | ICD-10-CM | POA: Diagnosis not present

## 2020-04-06 DIAGNOSIS — E041 Nontoxic single thyroid nodule: Secondary | ICD-10-CM | POA: Diagnosis not present

## 2020-04-21 DIAGNOSIS — R1031 Right lower quadrant pain: Secondary | ICD-10-CM | POA: Diagnosis not present

## 2020-04-26 ENCOUNTER — Other Ambulatory Visit: Payer: Self-pay | Admitting: Surgery

## 2020-04-26 DIAGNOSIS — R1031 Right lower quadrant pain: Secondary | ICD-10-CM

## 2020-05-10 ENCOUNTER — Ambulatory Visit
Admission: RE | Admit: 2020-05-10 | Discharge: 2020-05-10 | Disposition: A | Discharge status: Home / Self Care | Payer: Medicare HMO | Source: Ambulatory Visit | Attending: Surgery | Admitting: Surgery

## 2020-05-10 ENCOUNTER — Other Ambulatory Visit: Payer: Self-pay

## 2020-05-10 DIAGNOSIS — N281 Cyst of kidney, acquired: Secondary | ICD-10-CM | POA: Diagnosis not present

## 2020-05-10 DIAGNOSIS — R1031 Right lower quadrant pain: Secondary | ICD-10-CM

## 2020-05-10 DIAGNOSIS — J984 Other disorders of lung: Secondary | ICD-10-CM | POA: Diagnosis not present

## 2020-05-10 DIAGNOSIS — I700 Atherosclerosis of aorta: Secondary | ICD-10-CM | POA: Diagnosis not present

## 2020-05-10 DIAGNOSIS — K5750 Diverticulosis of both small and large intestine without perforation or abscess without bleeding: Secondary | ICD-10-CM | POA: Diagnosis not present

## 2020-05-10 MED ORDER — IOPAMIDOL (ISOVUE-300) INJECTION 61%
80.0000 mL | Freq: Once | INTRAVENOUS | Status: AC | PRN
  Administered 2020-05-10: 80 mL via INTRAVENOUS

## 2020-05-17 ENCOUNTER — Ambulatory Visit: Payer: Medicare HMO | Admitting: Adult Health

## 2020-05-17 ENCOUNTER — Other Ambulatory Visit: Payer: Self-pay

## 2020-05-17 ENCOUNTER — Encounter: Payer: Self-pay | Admitting: Adult Health

## 2020-05-17 VITALS — BP 135/81 | HR 75 | Ht 61.0 in | Wt 175.0 lb

## 2020-05-17 DIAGNOSIS — G459 Transient cerebral ischemic attack, unspecified: Secondary | ICD-10-CM | POA: Diagnosis not present

## 2020-05-17 DIAGNOSIS — G43109 Migraine with aura, not intractable, without status migrainosus: Secondary | ICD-10-CM

## 2020-05-17 NOTE — Progress Notes (Signed)
[facility] [address] [place]. [place] [address] [phone]       OFFICE FOLLOW UP NOTE  [name] Date of Birth:  [birth year] Medical Record Number:  [id number]   Reason for Referral: TIA vs complicated migraine follow up    CHIEF COMPLAINT:  Chief Complaint  Patient presents with  . Follow-up    RM 14 with angela (daughter) Pt is well, no complaints     HPI:   Today, 05/17/2020, Valerie Wood returns for routine follow-up after prior visit approximately 9 months ago accompanied by her daughter, Levada Dy.  Doing well from a neurological standpoint without new or reoccurring stroke/TIA or complicated migraine symptoms. Hx of chronic migraines currently experiencing 1 migraine day per month which subsides with use of Excedrin.  Compliant on atorvastatin and aspirin -denies associated side effects.  Blood pressure today 135/81.  Glucose levels have been stable per patient routinely following with endocrinology Dr. Buddy Duty.  No new neurological concerns at this time.   History provided for reference purposes only Update 08/26/2019 JM: Valerie Wood is being seen for TIA follow-up.  Stable since prior visit with new or reoccurring stroke/TIA symptoms.  Denies reoccurring migraines or symptoms.  30-day cardiac event monitor negative for atrial fibrillation.  Remains on aspirin and atorvastatin without side effects.  Blood pressure today 151/72.  Continues to follow with endocrinology for DM management.  No stroke/neurological concerns at this time.  Initial visit 04/09/2019 JM: Valerie Wood is a 75 year old female who is being seen today for stroke follow up. She has been doing well since discharge without reoccurring speech concerns or headaches/migraines. Currently wearing cardiac monitor which will be completed 3/10. Completed 3 week DAPT and continues on aspirin 81mg  daily without bleeding or bruising. Continues on atorvastatin 40mg  daily without myalgias. Blood pressure  today 158/81. Monitored at home and stable - elevates at doctors appointments. She has had difficulty with glucose control with recent read out range 80-180. Continues to follow with endocrinology routinely. No further concerns at this time.   Hospital admission 03/03/2019: Valerie Wood is a 75 y.o. female with history of diabetes mellitus, COPD, hypertension, coronary artery disease, migraines  presented on 03/03/2019 with transient garbled speech and expressive aphasia following a migraine.   Evaluated by stroke team and Dr. Erlinda Hong with stroke work-up largely unremarkable with symptoms most likely complicated migraine however given stroke risk factors, TIA still in DDx.  Recommended 30-day cardiac event monitor outpatient to rule out A. fib due to symptoms of expressive aphasia.  Recommended DAPT for 3 weeks and aspirin alone as not previously on antithrombotic.  Prior history of migraine with visual aura with current episode concerning for complicated migraine with speech difficulty and advised to follow-up with PCP.  History of BPPV with episode during admission while laying flat for CT.  HTN stable.  LDL 52 and recommended continuation of home dose atorvastatin 40 mg daily.  Uncontrolled DM with A1c 7.9 and recommended close PCP follow-up.  Other stroke risk factors include advanced age, former tobacco use, obesity, CAD s/p NSTEMI and migraines with visual aura but no prior history of stroke.  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  No residual stroke deficits but did recommend outpatient PT for BPPV and was discharged home in stable condition.    ROS:   14 system review of systems performed and negative with exception of  no complaints  PMH:  Past Medical History:  Diagnosis Date  . Anxiety   . Arthritis    hands, all over, back  . Atherosclerosis 2017  . Atrophic gastritis without mention of  hemorrhage   . Bilateral dry eyes   . Cataract    Bilateral  . COPD (chronic obstructive pulmonary disease) ([place])   . Diabetes mellitus    pt didn't divulge DM during PAT interview but Dr. Paulene Floor nurse states that pt is type 2 diabetic, controlled by diet.  . Diverticulosis, sigmoid   . Duodenitis without mention of hemorrhage   . Enterocele    History of  . Esophagitis, unspecified   . Fibromyalgia   . Gastroparesis due to DM ([employer])   . GERD (gastroesophageal reflux disease)   . Glaucoma   . History of concussion   . Hx of colonic polyps   . Hyperlipidemia   . Hypertension   . L4-L5 disc bulge 03/2003  . Migraine   . Myocardial infarction [facility])    NSTEMI, patient states no one told her of this  . Osteopenia   . 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 PONV (postoperative nausea and vomiting)   . Rectocele    History of  . Right rib fracture 12/20/2013  . Seasonal allergies   . Shortness of breath    quit smoking 2013  . Skin cancer    Nose  . Stricture and stenosis of esophagus   . Thyroid nodule    Bilateral  . Tobacco dependence   . Uterine cancer ([place]) 1989  . Vertigo   . Vitamin D deficiency     PSH:  Past Surgical History:  Procedure Laterality Date  . APPENDECTOMY    . BLADDER SURGERY     Tack   . CHOLECYSTECTOMY    . COLONOSCOPY  last one 11/09/2017   multiple  . DILATION AND EVACUATION    . ESOPHAGOGASTRODUODENOSCOPY  last one 12/06/2004   multiple  . LEEP    . PARATHYROIDECTOMY Right 03/15/2018   Procedure: RIGHT INFERIOR PARATHYROIDECTOMY;  Surgeon: Armandina Gemma, MD;  Location: [facility];  Service: General;  Laterality: Right;  . reclast     2019  . RECTOCELE REPAIR  05/11/2011   Procedure: POSTERIOR REPAIR (RECTOCELE);  Surgeon: Cheri Fowler, MD;  Location: [facility];  Service: Gynecology;  Laterality: N/A;  . TONSILLECTOMY    . TUBAL LIGATION    . VAGINAL HYSTERECTOMY  12/1987   BSO    Social History:  Social History   Socioeconomic History  .  Marital status: Widowed    Spouse name: Not on file  . Number of children: 2  . Years of education: Not on file  . Highest education level: Not on file  Occupational History  . Occupation: Retired    Comment: Secondary school teacher  . Smoking status: Former Smoker    Packs/day: 1.00    Years: 40.00    Pack years: 40.00    Types: Cigarettes    Quit date: 02/22/2011    Years since quitting: 9.2  . Smokeless tobacco: Never Used  Vaping Use  . Vaping Use: Never used  Substance and Sexual Activity  . Alcohol use: No  . Drug use: No  . Sexual activity: Not on file  Other Topics Concern  . Not on file  Social History Narrative   Caffeine daily    Social Determinants of Health   Financial Resource Strain: Not on file  Food Insecurity: Not  on file  Transportation Needs: Not on file  Physical Activity: Not on file  Stress: Not on file  Social Connections: Not on file  Intimate Partner Violence: Not on file    Family History:  Family History  Problem Relation Age of Onset  . Colon cancer Father 100  . Stomach cancer Father   . Heart disease Mother        MI, CVA  . Rectal cancer Neg Hx   . Esophageal cancer Neg Hx     Medications:   Current Outpatient Medications on File Prior to Visit  Medication Sig Dispense Refill  . Accu-Chek FastClix Lancets MISC     . ACCU-CHEK SMARTVIEW test strip     . aspirin EC 81 MG tablet Take 81 mg by mouth daily.    Marland Kitchen atorvastatin (LIPITOR) 40 MG tablet Take 40 mg by mouth at bedtime.     . BD VEO INSULIN SYRINGE U/F 31G X 15/64" 0.5 ML MISC     . Biotin 5 MG TABS Take 5 mg by mouth at bedtime.     . celecoxib (CELEBREX) 200 MG capsule Take 1 capsule (200 mg total) by mouth 2 (two) times daily. (Patient taking differently: Take 200 mg by mouth at bedtime.) 180 capsule 2  . cetirizine (ZYRTEC) 10 MG tablet Take 10 mg by mouth at bedtime.     . cholecalciferol (VITAMIN D) 1000 UNITS tablet Take 1,000 Units by mouth at bedtime.     .  insulin NPH-regular Human (NOVOLIN 70/30) (70-30) 100 UNIT/ML injection Inject 30-40 Units into the skin 2 (two) times daily. Inject 40 units subcutaneously in the morning and 50 units at supper    . losartan (COZAAR) 50 MG tablet Take 50 mg by mouth at bedtime.    . Multiple Vitamin (MULTIVITAMIN WITH MINERALS) TABS tablet Take 1 tablet by mouth at bedtime.     Marland Kitchen omeprazole (PRILOSEC) 20 MG capsule Take 20 mg by mouth at bedtime.     Marland Kitchen RELION PEN NEEDLES 32G X 4 MM MISC     . traZODone (DESYREL) 50 MG tablet Take 50 mg by mouth at bedtime.     No current facility-administered medications on file prior to visit.    Allergies:   Allergies  Allergen Reactions  . Canagliflozin Itching and Nausea And Vomiting    Yeast infection  . Macrolides And Ketolides Nausea And Vomiting  . Morphine And Related Nausea And Vomiting  . Tetracyclines & Related Itching and Nausea And Vomiting  . Tramadol Nausea And Vomiting  . Doxycycline Hyclate     Stomach upset  . Gabapentin     Felt bad  . Glipizide Diarrhea  . Iloperidone Dermatitis  . Januvia [Sitagliptin] Nausea And Vomiting  . Macrobid [Nitrofurantoin Macrocrystal] Nausea And Vomiting  . Metformin   . Nitrofurantoin     n/v  . Pantoprazole Nausea And Vomiting  . Symbicort [Budesonide-Formoterol Fumarate]     Thrush   . Victoza [Liraglutide] Nausea And Vomiting  . Ciprofloxacin Hcl Rash     Physical Exam  Vitals:   05/17/20 0808  BP: 135/81  Pulse: 75  Weight: 175 lb (79.4 kg)  Height: 5\' 1"  (1.549 m)   Body mass index is 33.07 kg/m. No exam data present  General: well developed, well nourished, pleasant elderly Caucasian female, seated, in no evident distress Head: head normocephalic and atraumatic.   Neck: supple with no carotid or supraclavicular bruits Cardiovascular: regular rate and rhythm, no murmurs Musculoskeletal:  no deformity Skin:  no rash/petichiae Vascular:  Normal pulses all extremities   Neurologic  Exam Mental Status: Awake and fully alert.   Fluent speech and language. Oriented to place and time. Recent and remote memory intact. Attention span, concentration and fund of knowledge appropriate. Mood and affect appropriate.  Cranial Nerves: Pupils equal, briskly reactive to light. Extraocular movements full without nystagmus. Visual fields full to confrontation. Hearing intact. Facial sensation intact. Face, tongue, palate moves normally and symmetrically.  Motor: Normal bulk and tone. Normal strength in all tested extremity muscles. Sensory.: intact to touch , pinprick , position and vibratory sensation.  Coordination: Rapid alternating movements normal in all extremities. Finger-to-nose and heel-to-shin performed accurately bilaterally. Gait and Station: Arises from chair without difficulty. Stance is normal. Gait demonstrates normal stride length and balance without use of assistive device Reflexes: 1+ and symmetric. Toes downgoing.        ASSESSMENT/PLAN: Valerie Wood is a 75 y.o. year old female presented with transient garbled speech and expressive aphasia following a migraine on 03/03/2019 most likely complicated migraine versus TIA given risk factors.  Recommended 30-day cardiac event monitor to rule out A. fib. Vascular risk factors include HTN, HLD, DM, former tobacco use, obesity, CAD s/p NSTEMI, BPPV and migraines with visual aura.     1. TIA:  a. Stable without new or recurring symptoms b. Continue aspirin 81 mg daily  and atorvastatin for secondary stroke prevention.  c. Cardiac event monitor negative atrial fibrillation d. Ensure close PCP and endocrinology follow-up for aggressive stroke risk factor management e. HTN: BP goal<130/90.  Stable today.  Continue to monitor at home and follow-up with PCP f. HLD: LDL goal<70.  Continue atorvastatin and ensure close PCP follow-up for prescribing, monitoring and management g. DMII: A1c goal<7. Per patient, recent A1c "7  something" (unable to personally view via epic).  Followed routinely by endocrinology Dr. Buddy Duty 2. Migraine: currently well controlled without pharmacological management indicated.  Reports 1 migraine day per month.  Discussed use of CRGPs for emergent relief such as Ubrelvy or Nurtec if indicated in the future. Would not recommend use of triptans due to hx of stroke and cardiac disease.  She declines interest at this time but will further discuss with Dr. Brigitte Pulse if needed in the future    Overall stable from stroke standpoint and recommend follow-up on an as-needed basis which patient agrees with   CC:  [place] provider: Dr. Dolores Lory, Nathen May, MD    I spent 30 minutes of face-to-face and non-face-to-face time with patient and daughter.  This included previsit chart review, lab review, study review, order entry, electronic health record documentation, patient education regarding history of TIA vs complicated migraine, importance of managing stroke risk factors, chronic migraines and potential future treatment options and answered all other questions to patient and daughters satisfaction   Frann Rider, AGNP-BC  [facility] [address] [place], [place] [address]  Phone [phone] Fax [phone] Note: This document was prepared with digital dictation and possible smart phrase technology. Any transcriptional errors that result from this process are unintentional.

## 2020-05-17 NOTE — Patient Instructions (Signed)
Continue aspirin 81 mg daily  and atorvastatin for secondary stroke prevention  Continue to follow up with PCP regarding cholesterol, blood pressure and diabetes management  Maintain strict control of hypertension with blood pressure goal below 130/90, diabetes with hemoglobin A1c goal below 7% and cholesterol with LDL cholesterol (bad cholesterol) goal below 70 mg/dL.   Consider use of acute management medications for your  Migraines such as Ubrelvy or Nurtec - these can be prescribed by Dr. Brigitte Pulse but if you wish, we can also further assist you      Thank you for coming to see [place] at [facility]. I hope we have been able to provide you high quality care today.  You may receive a patient satisfaction survey over the next few weeks. We would appreciate your feedback and comments so that we may continue to improve ourselves and the health of our patients.

## 2020-05-17 NOTE — Progress Notes (Signed)
I agree with the above plan 

## 2020-05-25 DIAGNOSIS — M7918 Myalgia, other site: Secondary | ICD-10-CM | POA: Diagnosis not present

## 2020-05-25 DIAGNOSIS — M47816 Spondylosis without myelopathy or radiculopathy, lumbar region: Secondary | ICD-10-CM | POA: Diagnosis not present

## 2020-05-25 DIAGNOSIS — M5136 Other intervertebral disc degeneration, lumbar region: Secondary | ICD-10-CM | POA: Diagnosis not present

## 2020-05-25 DIAGNOSIS — M25551 Pain in right hip: Secondary | ICD-10-CM | POA: Diagnosis not present

## 2020-07-02 DIAGNOSIS — N3020 Other chronic cystitis without hematuria: Secondary | ICD-10-CM | POA: Diagnosis not present

## 2020-07-02 DIAGNOSIS — R350 Frequency of micturition: Secondary | ICD-10-CM | POA: Diagnosis not present

## 2020-07-02 DIAGNOSIS — N952 Postmenopausal atrophic vaginitis: Secondary | ICD-10-CM | POA: Diagnosis not present

## 2020-07-02 DIAGNOSIS — R3915 Urgency of urination: Secondary | ICD-10-CM | POA: Diagnosis not present

## 2020-07-02 DIAGNOSIS — N3941 Urge incontinence: Secondary | ICD-10-CM | POA: Diagnosis not present

## 2020-07-09 DIAGNOSIS — R062 Wheezing: Secondary | ICD-10-CM | POA: Diagnosis not present

## 2020-07-09 DIAGNOSIS — K219 Gastro-esophageal reflux disease without esophagitis: Secondary | ICD-10-CM | POA: Diagnosis not present

## 2020-08-02 DIAGNOSIS — I10 Essential (primary) hypertension: Secondary | ICD-10-CM | POA: Diagnosis not present

## 2020-08-02 DIAGNOSIS — E7800 Pure hypercholesterolemia, unspecified: Secondary | ICD-10-CM | POA: Diagnosis not present

## 2020-08-02 DIAGNOSIS — E039 Hypothyroidism, unspecified: Secondary | ICD-10-CM | POA: Diagnosis not present

## 2020-08-02 DIAGNOSIS — E1169 Type 2 diabetes mellitus with other specified complication: Secondary | ICD-10-CM | POA: Diagnosis not present

## 2020-09-15 DIAGNOSIS — H2513 Age-related nuclear cataract, bilateral: Secondary | ICD-10-CM | POA: Diagnosis not present

## 2020-09-15 DIAGNOSIS — H524 Presbyopia: Secondary | ICD-10-CM | POA: Diagnosis not present

## 2020-09-15 DIAGNOSIS — Z7984 Long term (current) use of oral hypoglycemic drugs: Secondary | ICD-10-CM | POA: Diagnosis not present

## 2020-09-15 DIAGNOSIS — H40013 Open angle with borderline findings, low risk, bilateral: Secondary | ICD-10-CM | POA: Diagnosis not present

## 2020-09-15 DIAGNOSIS — E119 Type 2 diabetes mellitus without complications: Secondary | ICD-10-CM | POA: Diagnosis not present

## 2020-09-15 DIAGNOSIS — Z794 Long term (current) use of insulin: Secondary | ICD-10-CM | POA: Diagnosis not present

## 2020-09-15 DIAGNOSIS — H5213 Myopia, bilateral: Secondary | ICD-10-CM | POA: Diagnosis not present

## 2020-10-13 DIAGNOSIS — Z8639 Personal history of other endocrine, nutritional and metabolic disease: Secondary | ICD-10-CM | POA: Diagnosis not present

## 2020-10-13 DIAGNOSIS — I10 Essential (primary) hypertension: Secondary | ICD-10-CM | POA: Diagnosis not present

## 2020-10-13 DIAGNOSIS — E041 Nontoxic single thyroid nodule: Secondary | ICD-10-CM | POA: Diagnosis not present

## 2020-10-13 DIAGNOSIS — M810 Age-related osteoporosis without current pathological fracture: Secondary | ICD-10-CM | POA: Diagnosis not present

## 2020-10-13 DIAGNOSIS — E1165 Type 2 diabetes mellitus with hyperglycemia: Secondary | ICD-10-CM | POA: Diagnosis not present

## 2020-10-13 DIAGNOSIS — E119 Type 2 diabetes mellitus without complications: Secondary | ICD-10-CM | POA: Diagnosis not present

## 2020-10-13 DIAGNOSIS — Z794 Long term (current) use of insulin: Secondary | ICD-10-CM | POA: Diagnosis not present

## 2020-10-13 DIAGNOSIS — E669 Obesity, unspecified: Secondary | ICD-10-CM | POA: Diagnosis not present

## 2020-10-14 DIAGNOSIS — Z1231 Encounter for screening mammogram for malignant neoplasm of breast: Secondary | ICD-10-CM | POA: Diagnosis not present

## 2020-10-19 ENCOUNTER — Other Ambulatory Visit: Payer: Self-pay | Admitting: Internal Medicine

## 2020-10-19 DIAGNOSIS — M810 Age-related osteoporosis without current pathological fracture: Secondary | ICD-10-CM

## 2020-11-08 DIAGNOSIS — Z780 Asymptomatic menopausal state: Secondary | ICD-10-CM | POA: Diagnosis not present

## 2020-11-08 DIAGNOSIS — M8588 Other specified disorders of bone density and structure, other site: Secondary | ICD-10-CM | POA: Diagnosis not present

## 2020-12-06 DIAGNOSIS — M7551 Bursitis of right shoulder: Secondary | ICD-10-CM | POA: Diagnosis not present

## 2020-12-13 DIAGNOSIS — G5601 Carpal tunnel syndrome, right upper limb: Secondary | ICD-10-CM | POA: Diagnosis not present

## 2020-12-13 DIAGNOSIS — M25511 Pain in right shoulder: Secondary | ICD-10-CM | POA: Diagnosis not present

## 2020-12-13 DIAGNOSIS — S46001A Unspecified injury of muscle(s) and tendon(s) of the rotator cuff of right shoulder, initial encounter: Secondary | ICD-10-CM | POA: Diagnosis not present

## 2020-12-21 DIAGNOSIS — H25013 Cortical age-related cataract, bilateral: Secondary | ICD-10-CM | POA: Diagnosis not present

## 2020-12-21 DIAGNOSIS — H2513 Age-related nuclear cataract, bilateral: Secondary | ICD-10-CM | POA: Diagnosis not present

## 2021-01-01 DIAGNOSIS — M25511 Pain in right shoulder: Secondary | ICD-10-CM | POA: Diagnosis not present

## 2021-01-07 DIAGNOSIS — M75121 Complete rotator cuff tear or rupture of right shoulder, not specified as traumatic: Secondary | ICD-10-CM | POA: Diagnosis not present

## 2021-01-07 DIAGNOSIS — M6530 Trigger finger, unspecified finger: Secondary | ICD-10-CM | POA: Diagnosis not present

## 2021-01-12 DIAGNOSIS — H2511 Age-related nuclear cataract, right eye: Secondary | ICD-10-CM | POA: Diagnosis not present

## 2021-01-12 DIAGNOSIS — H25012 Cortical age-related cataract, left eye: Secondary | ICD-10-CM | POA: Diagnosis not present

## 2021-01-12 DIAGNOSIS — H2512 Age-related nuclear cataract, left eye: Secondary | ICD-10-CM | POA: Diagnosis not present

## 2021-01-12 DIAGNOSIS — H25011 Cortical age-related cataract, right eye: Secondary | ICD-10-CM | POA: Diagnosis not present

## 2021-01-19 DIAGNOSIS — H25011 Cortical age-related cataract, right eye: Secondary | ICD-10-CM | POA: Diagnosis not present

## 2021-01-19 DIAGNOSIS — H2511 Age-related nuclear cataract, right eye: Secondary | ICD-10-CM | POA: Diagnosis not present

## 2021-01-21 DIAGNOSIS — G5601 Carpal tunnel syndrome, right upper limb: Secondary | ICD-10-CM | POA: Diagnosis not present

## 2021-01-21 DIAGNOSIS — M65331 Trigger finger, right middle finger: Secondary | ICD-10-CM | POA: Diagnosis not present

## 2021-03-01 DIAGNOSIS — E7800 Pure hypercholesterolemia, unspecified: Secondary | ICD-10-CM | POA: Diagnosis not present

## 2021-03-01 DIAGNOSIS — J42 Unspecified chronic bronchitis: Secondary | ICD-10-CM | POA: Diagnosis not present

## 2021-03-01 DIAGNOSIS — E210 Primary hyperparathyroidism: Secondary | ICD-10-CM | POA: Diagnosis not present

## 2021-03-01 DIAGNOSIS — M797 Fibromyalgia: Secondary | ICD-10-CM | POA: Diagnosis not present

## 2021-03-01 DIAGNOSIS — R809 Proteinuria, unspecified: Secondary | ICD-10-CM | POA: Diagnosis not present

## 2021-03-01 DIAGNOSIS — Z0000 Encounter for general adult medical examination without abnormal findings: Secondary | ICD-10-CM | POA: Diagnosis not present

## 2021-03-01 DIAGNOSIS — I10 Essential (primary) hypertension: Secondary | ICD-10-CM | POA: Diagnosis not present

## 2021-03-01 DIAGNOSIS — E039 Hypothyroidism, unspecified: Secondary | ICD-10-CM | POA: Diagnosis not present

## 2021-03-01 DIAGNOSIS — F411 Generalized anxiety disorder: Secondary | ICD-10-CM | POA: Diagnosis not present

## 2021-03-01 DIAGNOSIS — E1169 Type 2 diabetes mellitus with other specified complication: Secondary | ICD-10-CM | POA: Diagnosis not present

## 2021-03-01 DIAGNOSIS — N390 Urinary tract infection, site not specified: Secondary | ICD-10-CM | POA: Diagnosis not present

## 2021-03-15 DIAGNOSIS — S46211A Strain of muscle, fascia and tendon of other parts of biceps, right arm, initial encounter: Secondary | ICD-10-CM | POA: Diagnosis not present

## 2021-03-15 DIAGNOSIS — M19011 Primary osteoarthritis, right shoulder: Secondary | ICD-10-CM | POA: Diagnosis not present

## 2021-03-15 DIAGNOSIS — G8918 Other acute postprocedural pain: Secondary | ICD-10-CM | POA: Diagnosis not present

## 2021-03-15 DIAGNOSIS — M75121 Complete rotator cuff tear or rupture of right shoulder, not specified as traumatic: Secondary | ICD-10-CM | POA: Diagnosis not present

## 2021-03-15 DIAGNOSIS — M94211 Chondromalacia, right shoulder: Secondary | ICD-10-CM | POA: Diagnosis not present

## 2021-03-15 DIAGNOSIS — M75111 Incomplete rotator cuff tear or rupture of right shoulder, not specified as traumatic: Secondary | ICD-10-CM | POA: Diagnosis not present

## 2021-03-15 DIAGNOSIS — S46111A Strain of muscle, fascia and tendon of long head of biceps, right arm, initial encounter: Secondary | ICD-10-CM | POA: Diagnosis not present

## 2021-03-15 DIAGNOSIS — S43431A Superior glenoid labrum lesion of right shoulder, initial encounter: Secondary | ICD-10-CM | POA: Diagnosis not present

## 2021-03-15 DIAGNOSIS — M7541 Impingement syndrome of right shoulder: Secondary | ICD-10-CM | POA: Diagnosis not present

## 2021-03-15 DIAGNOSIS — M24111 Other articular cartilage disorders, right shoulder: Secondary | ICD-10-CM | POA: Diagnosis not present

## 2021-03-22 DIAGNOSIS — M25511 Pain in right shoulder: Secondary | ICD-10-CM | POA: Diagnosis not present

## 2021-03-28 DIAGNOSIS — M25511 Pain in right shoulder: Secondary | ICD-10-CM | POA: Diagnosis not present

## 2021-03-28 DIAGNOSIS — Z4789 Encounter for other orthopedic aftercare: Secondary | ICD-10-CM | POA: Diagnosis not present

## 2021-04-06 DIAGNOSIS — M25511 Pain in right shoulder: Secondary | ICD-10-CM | POA: Diagnosis not present

## 2021-04-12 DIAGNOSIS — M25511 Pain in right shoulder: Secondary | ICD-10-CM | POA: Diagnosis not present

## 2021-04-15 DIAGNOSIS — Z8639 Personal history of other endocrine, nutritional and metabolic disease: Secondary | ICD-10-CM | POA: Diagnosis not present

## 2021-04-15 DIAGNOSIS — M810 Age-related osteoporosis without current pathological fracture: Secondary | ICD-10-CM | POA: Diagnosis not present

## 2021-04-15 DIAGNOSIS — E041 Nontoxic single thyroid nodule: Secondary | ICD-10-CM | POA: Diagnosis not present

## 2021-04-15 DIAGNOSIS — Z794 Long term (current) use of insulin: Secondary | ICD-10-CM | POA: Diagnosis not present

## 2021-04-15 DIAGNOSIS — E1165 Type 2 diabetes mellitus with hyperglycemia: Secondary | ICD-10-CM | POA: Diagnosis not present

## 2021-04-15 DIAGNOSIS — E119 Type 2 diabetes mellitus without complications: Secondary | ICD-10-CM | POA: Diagnosis not present

## 2021-04-15 DIAGNOSIS — E669 Obesity, unspecified: Secondary | ICD-10-CM | POA: Diagnosis not present

## 2021-04-15 DIAGNOSIS — I10 Essential (primary) hypertension: Secondary | ICD-10-CM | POA: Diagnosis not present

## 2021-04-19 DIAGNOSIS — M25511 Pain in right shoulder: Secondary | ICD-10-CM | POA: Diagnosis not present

## 2021-04-26 DIAGNOSIS — M25511 Pain in right shoulder: Secondary | ICD-10-CM | POA: Diagnosis not present

## 2021-04-28 DIAGNOSIS — M25511 Pain in right shoulder: Secondary | ICD-10-CM | POA: Diagnosis not present

## 2021-05-02 DIAGNOSIS — J309 Allergic rhinitis, unspecified: Secondary | ICD-10-CM | POA: Diagnosis not present

## 2021-05-02 DIAGNOSIS — I10 Essential (primary) hypertension: Secondary | ICD-10-CM | POA: Diagnosis not present

## 2021-05-02 DIAGNOSIS — E1169 Type 2 diabetes mellitus with other specified complication: Secondary | ICD-10-CM | POA: Diagnosis not present

## 2021-05-02 DIAGNOSIS — R059 Cough, unspecified: Secondary | ICD-10-CM | POA: Diagnosis not present

## 2021-05-05 DIAGNOSIS — M25511 Pain in right shoulder: Secondary | ICD-10-CM | POA: Diagnosis not present

## 2021-05-10 DIAGNOSIS — M25511 Pain in right shoulder: Secondary | ICD-10-CM | POA: Diagnosis not present

## 2021-05-21 ENCOUNTER — Encounter (HOSPITAL_BASED_OUTPATIENT_CLINIC_OR_DEPARTMENT_OTHER): Payer: Self-pay | Admitting: Emergency Medicine

## 2021-05-21 ENCOUNTER — Other Ambulatory Visit: Payer: Self-pay

## 2021-05-21 ENCOUNTER — Emergency Department (HOSPITAL_BASED_OUTPATIENT_CLINIC_OR_DEPARTMENT_OTHER): Payer: Medicare HMO

## 2021-05-21 ENCOUNTER — Emergency Department (HOSPITAL_BASED_OUTPATIENT_CLINIC_OR_DEPARTMENT_OTHER)
Admission: EM | Admit: 2021-05-21 | Discharge: 2021-05-21 | Disposition: A | Discharge status: Home / Self Care | Payer: Medicare HMO | Attending: Emergency Medicine | Admitting: Emergency Medicine

## 2021-05-21 DIAGNOSIS — Z79899 Other long term (current) drug therapy: Secondary | ICD-10-CM | POA: Diagnosis not present

## 2021-05-21 DIAGNOSIS — I10 Essential (primary) hypertension: Secondary | ICD-10-CM | POA: Diagnosis not present

## 2021-05-21 DIAGNOSIS — Z7982 Long term (current) use of aspirin: Secondary | ICD-10-CM | POA: Insufficient documentation

## 2021-05-21 DIAGNOSIS — R112 Nausea with vomiting, unspecified: Secondary | ICD-10-CM | POA: Diagnosis not present

## 2021-05-21 DIAGNOSIS — M25511 Pain in right shoulder: Secondary | ICD-10-CM

## 2021-05-21 DIAGNOSIS — Z794 Long term (current) use of insulin: Secondary | ICD-10-CM | POA: Diagnosis not present

## 2021-05-21 LAB — BASIC METABOLIC PANEL
Anion gap: 8 (ref 5–15)
BUN: 27 mg/dL — ABNORMAL HIGH (ref 8–23)
CO2: 24 mmol/L (ref 22–32)
Calcium: 9.7 mg/dL (ref 8.9–10.3)
Chloride: 105 mmol/L (ref 98–111)
Creatinine, Ser: 0.99 mg/dL (ref 0.44–1.00)
GFR, Estimated: 59 mL/min — ABNORMAL LOW (ref >60)
Glucose, Bld: 159 mg/dL — ABNORMAL HIGH (ref 70–99)
Potassium: 3.7 mmol/L (ref 3.5–5.1)
Sodium: 137 mmol/L (ref 135–145)

## 2021-05-21 LAB — CBC WITH DIFFERENTIAL/PLATELET
Abs Immature Granulocytes: 0.02 10*3/uL (ref 0.00–0.07)
Basophils Absolute: 0.1 10*3/uL (ref 0.0–0.1)
Basophils Relative: 1 %
Eosinophils Absolute: 0.3 10*3/uL (ref 0.0–0.5)
Eosinophils Relative: 3 %
HCT: 38.9 % (ref 36.0–46.0)
Hemoglobin: 13.3 g/dL (ref 12.0–15.0)
Immature Granulocytes: 0 %
Lymphocytes Relative: 23 %
Lymphs Abs: 1.9 10*3/uL (ref 0.7–4.0)
MCH: 30.8 pg (ref 26.0–34.0)
MCHC: 34.2 g/dL (ref 30.0–36.0)
MCV: 90 fL (ref 80.0–100.0)
Monocytes Absolute: 0.6 10*3/uL (ref 0.1–1.0)
Monocytes Relative: 8 %
Neutro Abs: 5.4 10*3/uL (ref 1.7–7.7)
Neutrophils Relative %: 65 %
Platelets: 221 10*3/uL (ref 150–400)
RBC: 4.32 MIL/uL (ref 3.87–5.11)
RDW: 13.2 % (ref 11.5–15.5)
WBC: 8.2 10*3/uL (ref 4.0–10.5)
nRBC: 0 % (ref 0.0–0.2)

## 2021-05-21 MED ORDER — ONDANSETRON 4 MG PO TBDP: ORAL_TABLET | ORAL | 0 refills | Status: DC

## 2021-05-21 MED ORDER — HYDROMORPHONE HCL 1 MG/ML IJ SOLN
2.0000 mg | Freq: Once | INTRAMUSCULAR | Status: AC
  Administered 2021-05-21: 2 mg via INTRAMUSCULAR
  Filled 2021-05-21: qty 2

## 2021-05-21 MED ORDER — ONDANSETRON 4 MG PO TBDP
8.0000 mg | ORAL_TABLET | Freq: Once | ORAL | Status: DC
  Filled 2021-05-21: qty 2

## 2021-05-21 MED ORDER — METOCLOPRAMIDE HCL 5 MG/ML IJ SOLN
10.0000 mg | Freq: Once | INTRAMUSCULAR | Status: AC
  Administered 2021-05-21: 10 mg via INTRAVENOUS
  Filled 2021-05-21: qty 2

## 2021-05-21 MED ORDER — SODIUM CHLORIDE 0.9 % IV BOLUS: 1000.0000 mL | Freq: Once | INTRAVENOUS | Status: DC

## 2021-05-21 MED ORDER — DIPHENHYDRAMINE HCL 50 MG/ML IJ SOLN
25.0000 mg | Freq: Once | INTRAMUSCULAR | Status: DC
  Filled 2021-05-21: qty 1

## 2021-05-21 MED ORDER — ONDANSETRON 4 MG PO TBDP
4.0000 mg | ORAL_TABLET | Freq: Once | ORAL | Status: AC
  Administered 2021-05-21: 4 mg via ORAL
  Filled 2021-05-21: qty 1

## 2021-05-21 NOTE — Progress Notes (Signed)
Patient's SPO2 dropped to 85% when she fell asleep.  Placed patient on 2 liter nasal cannula.  Patient's SPO2 is 94%  RT will continue to monitor.  ?

## 2021-05-21 NOTE — ED Notes (Addendum)
Benadryl ws not given due to respiration  MD is aware.Patient is breathing non labored ?

## 2021-05-21 NOTE — ED Triage Notes (Signed)
Pt c/o right shoulder pain, right arm pain and tingling for a few weeks. Hx of shoulder surgery on 2/7. Working with physical therapy. Pt reports she took 2 oxycodone's and a muscle relaxer today at 1830 with no relief. Denies cp, shob, dizziness, neck pain.  ?

## 2021-05-21 NOTE — Discharge Instructions (Signed)
Please take your oxycodone as prescribed by your doctor. ? ?You can take some Zofran for nausea ? ?You need to see [facility] on Monday ? ?Return to ER if you have worse shoulder pain, vomiting, dehydration ?

## 2021-05-21 NOTE — ED Provider Notes (Signed)
?[facility] EMERGENCY DEPARTMENT ?Provider Note ? ? ?CSN: [id number] ?Arrival date & time: 05/21/21  1915 ? ?  ? ?History ? ?Chief Complaint  ?Patient presents with  ? Shoulder Pain  ? ? ?NANCE MCCOMBS is a 76 y.o. female hx of hypertension, recent right rotator cuff repair here presenting with right shoulder pain.  Patient states that she had right rotator cuff repaired done in February.  Patient is still doing physical therapy.  She states that the pain initially improved but then got worse over the last several days.  She states that she took 2 Percocets with minimal relief.  Denies any fall or injury onto the shoulder.  She states that her shoulder area is slightly more swollen.  She tried to call [facility] but was unable to get appointment today so came here for further evaluation.  ? ?The history is provided by the patient.  ? ?  ? ?Home Medications ?Prior to Admission medications   ?Medication Sig Start Date End Date Taking? Authorizing Provider  ?Accu-Chek FastClix Lancets MISC  08/19/18   [provider]  ?Jerold Coombe test strip  01/03/19   [provider]  ?aspirin EC 81 MG tablet Take 81 mg by mouth daily.    [provider]  ?atorvastatin (LIPITOR) 40 MG tablet Take 40 mg by mouth at bedtime.     [provider]  ?BD VEO INSULIN SYRINGE U/F 31G X 15/64" 0.5 ML MISC  08/28/18   [provider]  ?Biotin 5 MG TABS Take 5 mg by mouth at bedtime.     [provider]  ?celecoxib (CELEBREX) 200 MG capsule Take 1 capsule (200 mg total) by mouth 2 (two) times daily. ?Patient taking differently: Take 200 mg by mouth at bedtime. 10/03/17   Aundra Dubin, PA-C  ?cetirizine (ZYRTEC) 10 MG tablet Take 10 mg by mouth at bedtime.     [provider]  ?cholecalciferol (VITAMIN D) 1000 UNITS tablet Take 1,000 Units by mouth at bedtime.     [provider]  ?insulin NPH-regular Human (NOVOLIN 70/30) (70-30) 100 UNIT/ML injection  Inject 30-40 Units into the skin 2 (two) times daily. Inject 40 units subcutaneously in the morning and 50 units at supper    [provider]  ?losartan (COZAAR) 50 MG tablet Take 50 mg by mouth at bedtime.    [provider]  ?Multiple Vitamin (MULTIVITAMIN WITH MINERALS) TABS tablet Take 1 tablet by mouth at bedtime.     [provider]  ?omeprazole (PRILOSEC) 20 MG capsule Take 20 mg by mouth at bedtime.     [provider]  ?RELION PEN NEEDLES 32G X 4 MM MISC  12/19/18   [provider]  ?traZODone (DESYREL) 50 MG tablet Take 50 mg by mouth at bedtime.    [provider]  ?   ? ?Allergies    ?Canagliflozin, Macrolides and ketolides, Morphine and related, Tetracyclines & related, Tramadol, Doxycycline hyclate, Gabapentin, Glipizide, Iloperidone, Januvia [sitagliptin], Macrobid [nitrofurantoin macrocrystal], Metformin, Nitrofurantoin, Pantoprazole, Symbicort [budesonide-formoterol fumarate], Victoza [liraglutide], and Ciprofloxacin hcl   ? ?Review of Systems   ?Review of Systems  ?Musculoskeletal:   ?     R shoulder pain   ?All other systems reviewed and are negative. ? ?Physical Exam ?Updated Vital Signs ?BP (!) 175/98 (BP Location: Left Arm)   Pulse 85   Temp 98 ?F (36.7 ?C) (Oral)   Resp 16   SpO2 94%  ?Physical Exam ?Vitals and nursing  note reviewed.  ?Constitutional:   ?   Appearance: Normal appearance.  ?HENT:  ?   Head: Normocephalic.  ?   Nose: Nose normal.  ?   Mouth/Throat:  ?   Mouth: Mucous membranes are moist.  ?Eyes:  ?   Extraocular Movements: Extraocular movements intact.  ?   Pupils: Pupils are equal, round, and reactive to light.  ?Cardiovascular:  ?   Rate and Rhythm: Normal rate and regular rhythm.  ?   Pulses: Normal pulses.  ?Pulmonary:  ?   Effort: Pulmonary effort is normal.  ?   Breath sounds: Normal breath sounds.  ?Abdominal:  ?   General: Abdomen is flat.  ?Musculoskeletal:  ?   Cervical back: Normal range of motion.  ?    Comments: Right shoulder scars are healing well.  Has some mild swelling around the shoulder but no erythema.  Patient has no obvious right arm swelling.  Neurovascular intact in the right upper extremity   ?Skin: ?   Capillary Refill: Capillary refill takes less than 2 seconds.  ?Neurological:  ?   General: No focal deficit present.  ?   Mental Status: She is alert and oriented to person, place, and time.  ?Psychiatric:     ?   Mood and Affect: Mood normal.     ?   Behavior: Behavior normal.  ? ? ?ED Results / Procedures / Treatments   ?Labs ?(all labs ordered are listed, but only abnormal results are displayed) ?Labs Reviewed - No data to display ? ?EKG ?None ? ?Radiology ?No results found. ? ?Procedures ?Procedures  ? ? ?Medications Ordered in ED ?Medications  ?HYDROmorphone (DILAUDID) injection 2 mg (2 mg Intramuscular Given 05/21/21 1956)  ? ? ?ED Course/ Medical Decision Making/ A&P ?  ?                        ?Medical Decision Making ?LASHELLE KOY is a 76 y.o. female here with right shoulder pain.  Patient is about 2 months out after her shoulder surgery.  Patient has some swelling around the shoulder but no signs of infection.  We will get x-rays.  I think if her pain does not improve, I recommend that she follows up with [facility] and get an MRI. Will give dilaudid IM for pain  ? ?9 pm ?Patient's pain is under control.  However she started vomiting.  We will try ODT Zofran ? ?10 pm ?Patient still vomiting despite ODT Zofran.  Will have nurse put IV and give Reglan.  Pain is improved and x-ray showed no fractures.  Patient has a sling at home ? ?10:56 PM ?Tolerated p.o. now.  Pain is under control.  Stable for discharge ? ?Problems Addressed: ?Acute pain of right shoulder: acute illness or injury ?Nausea and vomiting, unspecified vomiting type: acute illness or injury ? ?Amount and/or Complexity of Data Reviewed ?Labs: ordered. Decision-making details documented in ED Course. ?Radiology: ordered and  independent interpretation performed. Decision-making details documented in ED Course. ? ?Risk ?Prescription drug management. ? ? ?Final Clinical Impression(s) / ED Diagnoses ?Final diagnoses:  ?None  ? ? ?Rx / DC Orders ?ED Discharge Orders   ? ? None  ? ?  ? ? ?  ?Drenda Freeze, MD ?05/21/21 2256 ? ?

## 2021-05-21 NOTE — ED Notes (Signed)
Patient was given 2 4 mg Zofran to take home for use per MD. Patient was educated on how to take the medication. Verbalize understanding patient and her daughter. ?

## 2021-05-23 DIAGNOSIS — Z4789 Encounter for other orthopedic aftercare: Secondary | ICD-10-CM | POA: Diagnosis not present

## 2021-05-23 DIAGNOSIS — M5412 Radiculopathy, cervical region: Secondary | ICD-10-CM | POA: Diagnosis not present

## 2021-05-23 DIAGNOSIS — M542 Cervicalgia: Secondary | ICD-10-CM | POA: Diagnosis not present

## 2021-06-01 DIAGNOSIS — M25521 Pain in right elbow: Secondary | ICD-10-CM | POA: Diagnosis not present

## 2021-06-01 DIAGNOSIS — M542 Cervicalgia: Secondary | ICD-10-CM | POA: Diagnosis not present

## 2021-06-04 DIAGNOSIS — M5412 Radiculopathy, cervical region: Secondary | ICD-10-CM | POA: Diagnosis not present

## 2021-06-10 DIAGNOSIS — M542 Cervicalgia: Secondary | ICD-10-CM | POA: Diagnosis not present

## 2021-06-22 DIAGNOSIS — M5412 Radiculopathy, cervical region: Secondary | ICD-10-CM | POA: Diagnosis not present

## 2021-06-30 DIAGNOSIS — M5412 Radiculopathy, cervical region: Secondary | ICD-10-CM | POA: Diagnosis not present

## 2021-07-25 DIAGNOSIS — E041 Nontoxic single thyroid nodule: Secondary | ICD-10-CM | POA: Diagnosis not present

## 2021-07-25 DIAGNOSIS — R490 Dysphonia: Secondary | ICD-10-CM | POA: Diagnosis not present

## 2021-07-25 DIAGNOSIS — E1165 Type 2 diabetes mellitus with hyperglycemia: Secondary | ICD-10-CM | POA: Diagnosis not present

## 2021-07-25 DIAGNOSIS — Z794 Long term (current) use of insulin: Secondary | ICD-10-CM | POA: Diagnosis not present

## 2021-07-25 DIAGNOSIS — I10 Essential (primary) hypertension: Secondary | ICD-10-CM | POA: Diagnosis not present

## 2021-07-25 DIAGNOSIS — Z8639 Personal history of other endocrine, nutritional and metabolic disease: Secondary | ICD-10-CM | POA: Diagnosis not present

## 2021-07-25 DIAGNOSIS — M810 Age-related osteoporosis without current pathological fracture: Secondary | ICD-10-CM | POA: Diagnosis not present

## 2021-08-01 DIAGNOSIS — Z961 Presence of intraocular lens: Secondary | ICD-10-CM | POA: Diagnosis not present

## 2021-08-01 DIAGNOSIS — H353111 Nonexudative age-related macular degeneration, right eye, early dry stage: Secondary | ICD-10-CM | POA: Diagnosis not present

## 2021-08-29 DIAGNOSIS — N183 Chronic kidney disease, stage 3 unspecified: Secondary | ICD-10-CM | POA: Diagnosis not present

## 2021-08-29 DIAGNOSIS — E7800 Pure hypercholesterolemia, unspecified: Secondary | ICD-10-CM | POA: Diagnosis not present

## 2021-08-29 DIAGNOSIS — Z23 Encounter for immunization: Secondary | ICD-10-CM | POA: Diagnosis not present

## 2021-08-29 DIAGNOSIS — E039 Hypothyroidism, unspecified: Secondary | ICD-10-CM | POA: Diagnosis not present

## 2021-08-29 DIAGNOSIS — E1169 Type 2 diabetes mellitus with other specified complication: Secondary | ICD-10-CM | POA: Diagnosis not present

## 2021-08-29 DIAGNOSIS — I10 Essential (primary) hypertension: Secondary | ICD-10-CM | POA: Diagnosis not present

## 2021-09-21 DIAGNOSIS — H8111 Benign paroxysmal vertigo, right ear: Secondary | ICD-10-CM | POA: Diagnosis not present

## 2021-09-21 DIAGNOSIS — R490 Dysphonia: Secondary | ICD-10-CM | POA: Diagnosis not present

## 2021-09-21 DIAGNOSIS — K219 Gastro-esophageal reflux disease without esophagitis: Secondary | ICD-10-CM | POA: Diagnosis not present

## 2021-09-21 DIAGNOSIS — D170 Benign lipomatous neoplasm of skin and subcutaneous tissue of head, face and neck: Secondary | ICD-10-CM | POA: Diagnosis not present

## 2021-09-21 DIAGNOSIS — J301 Allergic rhinitis due to pollen: Secondary | ICD-10-CM | POA: Diagnosis not present

## 2021-10-20 DIAGNOSIS — Z1231 Encounter for screening mammogram for malignant neoplasm of breast: Secondary | ICD-10-CM | POA: Diagnosis not present

## 2021-11-01 DIAGNOSIS — H353111 Nonexudative age-related macular degeneration, right eye, early dry stage: Secondary | ICD-10-CM | POA: Diagnosis not present

## 2021-11-01 DIAGNOSIS — Z961 Presence of intraocular lens: Secondary | ICD-10-CM | POA: Diagnosis not present

## 2021-11-11 DIAGNOSIS — H353121 Nonexudative age-related macular degeneration, left eye, early dry stage: Secondary | ICD-10-CM | POA: Diagnosis not present

## 2021-11-11 DIAGNOSIS — H43813 Vitreous degeneration, bilateral: Secondary | ICD-10-CM | POA: Diagnosis not present

## 2021-11-11 DIAGNOSIS — H353211 Exudative age-related macular degeneration, right eye, with active choroidal neovascularization: Secondary | ICD-10-CM | POA: Diagnosis not present

## 2021-12-09 DIAGNOSIS — H353211 Exudative age-related macular degeneration, right eye, with active choroidal neovascularization: Secondary | ICD-10-CM | POA: Diagnosis not present

## 2021-12-09 DIAGNOSIS — H353121 Nonexudative age-related macular degeneration, left eye, early dry stage: Secondary | ICD-10-CM | POA: Diagnosis not present

## 2021-12-09 DIAGNOSIS — H43813 Vitreous degeneration, bilateral: Secondary | ICD-10-CM | POA: Diagnosis not present

## 2022-01-19 ENCOUNTER — Other Ambulatory Visit: Payer: Self-pay | Admitting: Internal Medicine

## 2022-01-19 DIAGNOSIS — Z8639 Personal history of other endocrine, nutritional and metabolic disease: Secondary | ICD-10-CM | POA: Diagnosis not present

## 2022-01-19 DIAGNOSIS — R590 Localized enlarged lymph nodes: Secondary | ICD-10-CM | POA: Diagnosis not present

## 2022-01-19 DIAGNOSIS — R591 Generalized enlarged lymph nodes: Secondary | ICD-10-CM

## 2022-01-19 DIAGNOSIS — R490 Dysphonia: Secondary | ICD-10-CM | POA: Diagnosis not present

## 2022-01-19 DIAGNOSIS — Z794 Long term (current) use of insulin: Secondary | ICD-10-CM | POA: Diagnosis not present

## 2022-01-19 DIAGNOSIS — E041 Nontoxic single thyroid nodule: Secondary | ICD-10-CM | POA: Diagnosis not present

## 2022-01-19 DIAGNOSIS — M810 Age-related osteoporosis without current pathological fracture: Secondary | ICD-10-CM | POA: Diagnosis not present

## 2022-01-19 DIAGNOSIS — I10 Essential (primary) hypertension: Secondary | ICD-10-CM | POA: Diagnosis not present

## 2022-01-19 DIAGNOSIS — E1165 Type 2 diabetes mellitus with hyperglycemia: Secondary | ICD-10-CM | POA: Diagnosis not present

## 2022-01-20 DIAGNOSIS — H353121 Nonexudative age-related macular degeneration, left eye, early dry stage: Secondary | ICD-10-CM | POA: Diagnosis not present

## 2022-01-20 DIAGNOSIS — H43813 Vitreous degeneration, bilateral: Secondary | ICD-10-CM | POA: Diagnosis not present

## 2022-01-20 DIAGNOSIS — H353211 Exudative age-related macular degeneration, right eye, with active choroidal neovascularization: Secondary | ICD-10-CM | POA: Diagnosis not present

## 2022-02-01 ENCOUNTER — Ambulatory Visit
Admission: RE | Admit: 2022-02-01 | Discharge: 2022-02-01 | Disposition: A | Discharge status: Home / Self Care | Payer: Medicare HMO | Source: Ambulatory Visit | Attending: Internal Medicine | Admitting: Internal Medicine

## 2022-02-01 DIAGNOSIS — R591 Generalized enlarged lymph nodes: Secondary | ICD-10-CM

## 2022-02-01 DIAGNOSIS — R590 Localized enlarged lymph nodes: Secondary | ICD-10-CM | POA: Diagnosis not present

## 2022-03-03 DIAGNOSIS — H353211 Exudative age-related macular degeneration, right eye, with active choroidal neovascularization: Secondary | ICD-10-CM | POA: Diagnosis not present

## 2022-03-09 DIAGNOSIS — Z0000 Encounter for general adult medical examination without abnormal findings: Secondary | ICD-10-CM | POA: Diagnosis not present

## 2022-03-09 DIAGNOSIS — N183 Chronic kidney disease, stage 3 unspecified: Secondary | ICD-10-CM | POA: Diagnosis not present

## 2022-03-09 DIAGNOSIS — J42 Unspecified chronic bronchitis: Secondary | ICD-10-CM | POA: Diagnosis not present

## 2022-03-09 DIAGNOSIS — E7800 Pure hypercholesterolemia, unspecified: Secondary | ICD-10-CM | POA: Diagnosis not present

## 2022-03-09 DIAGNOSIS — E039 Hypothyroidism, unspecified: Secondary | ICD-10-CM | POA: Diagnosis not present

## 2022-03-09 DIAGNOSIS — F411 Generalized anxiety disorder: Secondary | ICD-10-CM | POA: Diagnosis not present

## 2022-03-09 DIAGNOSIS — I10 Essential (primary) hypertension: Secondary | ICD-10-CM | POA: Diagnosis not present

## 2022-03-09 DIAGNOSIS — R809 Proteinuria, unspecified: Secondary | ICD-10-CM | POA: Diagnosis not present

## 2022-03-09 DIAGNOSIS — R8290 Unspecified abnormal findings in urine: Secondary | ICD-10-CM | POA: Diagnosis not present

## 2022-03-09 DIAGNOSIS — G4700 Insomnia, unspecified: Secondary | ICD-10-CM | POA: Diagnosis not present

## 2022-04-03 ENCOUNTER — Emergency Department (HOSPITAL_COMMUNITY): Payer: Medicare HMO

## 2022-04-03 ENCOUNTER — Encounter (HOSPITAL_COMMUNITY): Payer: Self-pay

## 2022-04-03 ENCOUNTER — Emergency Department (HOSPITAL_COMMUNITY)
Admission: EM | Admit: 2022-04-03 | Discharge: 2022-04-03 | Disposition: A | Discharge status: Home / Self Care | Payer: Medicare HMO | Attending: Emergency Medicine | Admitting: Emergency Medicine

## 2022-04-03 ENCOUNTER — Other Ambulatory Visit: Payer: Self-pay

## 2022-04-03 DIAGNOSIS — Z79899 Other long term (current) drug therapy: Secondary | ICD-10-CM | POA: Insufficient documentation

## 2022-04-03 DIAGNOSIS — Z7982 Long term (current) use of aspirin: Secondary | ICD-10-CM | POA: Diagnosis not present

## 2022-04-03 DIAGNOSIS — J449 Chronic obstructive pulmonary disease, unspecified: Secondary | ICD-10-CM | POA: Diagnosis not present

## 2022-04-03 DIAGNOSIS — R1013 Epigastric pain: Secondary | ICD-10-CM | POA: Insufficient documentation

## 2022-04-03 DIAGNOSIS — R079 Chest pain, unspecified: Secondary | ICD-10-CM

## 2022-04-03 DIAGNOSIS — R0789 Other chest pain: Secondary | ICD-10-CM | POA: Insufficient documentation

## 2022-04-03 DIAGNOSIS — R110 Nausea: Secondary | ICD-10-CM | POA: Diagnosis not present

## 2022-04-03 DIAGNOSIS — I10 Essential (primary) hypertension: Secondary | ICD-10-CM | POA: Diagnosis not present

## 2022-04-03 DIAGNOSIS — Z794 Long term (current) use of insulin: Secondary | ICD-10-CM | POA: Diagnosis not present

## 2022-04-03 DIAGNOSIS — S2231XA Fracture of one rib, right side, initial encounter for closed fracture: Secondary | ICD-10-CM | POA: Diagnosis not present

## 2022-04-03 DIAGNOSIS — E119 Type 2 diabetes mellitus without complications: Secondary | ICD-10-CM | POA: Insufficient documentation

## 2022-04-03 HISTORY — DX: Chest pain, unspecified: R07.9

## 2022-04-03 LAB — CBC
HCT: 37.1 % (ref 36.0–46.0)
Hemoglobin: 12.1 g/dL (ref 12.0–15.0)
MCH: 29.9 pg (ref 26.0–34.0)
MCHC: 32.6 g/dL (ref 30.0–36.0)
MCV: 91.6 fL (ref 80.0–100.0)
Platelets: 183 10*3/uL (ref 150–400)
RBC: 4.05 MIL/uL (ref 3.87–5.11)
RDW: 13.4 % (ref 11.5–15.5)
WBC: 5.9 10*3/uL (ref 4.0–10.5)
nRBC: 0 % (ref 0.0–0.2)

## 2022-04-03 LAB — HEPATIC FUNCTION PANEL
ALT: 21 U/L (ref 0–44)
AST: 21 U/L (ref 15–41)
Albumin: 3.6 g/dL (ref 3.5–5.0)
Alkaline Phosphatase: 53 U/L (ref 38–126)
Bilirubin, Direct: 0.1 mg/dL (ref 0.0–0.2)
Indirect Bilirubin: 0.4 mg/dL (ref 0.3–0.9)
Total Bilirubin: 0.5 mg/dL (ref 0.3–1.2)
Total Protein: 6.4 g/dL — ABNORMAL LOW (ref 6.5–8.1)

## 2022-04-03 LAB — BASIC METABOLIC PANEL
Anion gap: 6 (ref 5–15)
BUN: 19 mg/dL (ref 8–23)
CO2: 23 mmol/L (ref 22–32)
Calcium: 9.1 mg/dL (ref 8.9–10.3)
Chloride: 108 mmol/L (ref 98–111)
Creatinine, Ser: 0.96 mg/dL (ref 0.44–1.00)
GFR, Estimated: >60 mL/min (ref >60)
Glucose, Bld: 127 mg/dL — ABNORMAL HIGH (ref 70–99)
Potassium: 3.7 mmol/L (ref 3.5–5.1)
Sodium: 137 mmol/L (ref 135–145)

## 2022-04-03 LAB — LIPASE, BLOOD: Lipase: 38 U/L (ref 11–51)

## 2022-04-03 LAB — CBG MONITORING, ED: Glucose-Capillary: 97 mg/dL (ref 70–99)

## 2022-04-03 LAB — TROPONIN I (HIGH SENSITIVITY)
Troponin I (High Sensitivity): 6 ng/L (ref <18)
Troponin I (High Sensitivity): 12 ng/L (ref <18)

## 2022-04-03 LAB — D-DIMER, QUANTITATIVE: D-Dimer, Quant: <0.27 ug/mL-FEU (ref 0.00–0.50)

## 2022-04-03 MED ORDER — ONDANSETRON 4 MG PO TBDP: ORAL_TABLET | ORAL | 0 refills | Status: AC

## 2022-04-03 MED ORDER — ONDANSETRON 4 MG PO TBDP: ORAL_TABLET | ORAL | 0 refills | Status: DC

## 2022-04-03 MED ORDER — SODIUM CHLORIDE 0.9 % IV BOLUS
1000.0000 mL | Freq: Once | INTRAVENOUS | Status: AC
  Administered 2022-04-03: 1000 mL via INTRAVENOUS

## 2022-04-03 MED ORDER — ONDANSETRON HCL 4 MG/2ML IJ SOLN
4.0000 mg | Freq: Once | INTRAMUSCULAR | Status: AC
  Administered 2022-04-03: 4 mg via INTRAVENOUS
  Filled 2022-04-03: qty 2

## 2022-04-03 MED ORDER — DICLOFENAC SODIUM 1 % EX GEL: 4.0000 g | Freq: Four times a day (QID) | CUTANEOUS | 0 refills | Status: AC

## 2022-04-03 MED ORDER — FENTANYL CITRATE PF 50 MCG/ML IJ SOSY
50.0000 ug | PREFILLED_SYRINGE | Freq: Once | INTRAMUSCULAR | Status: AC
  Administered 2022-04-03: 50 ug via INTRAVENOUS
  Filled 2022-04-03: qty 1

## 2022-04-03 NOTE — Discharge Instructions (Addendum)
Use the gel as prescribed Also take tylenol '1000mg'$ (2 extra strength) four times a day.   Please return for worsening symptoms especially if they occur while you are exercising or going up stairs.  Please follow-up with your family doctor and the cardiologist in the office.

## 2022-04-03 NOTE — ED Provider Notes (Signed)
[facility] Provider Note   CSN: [id number] Arrival date & time: 04/03/22  [id number]     History  Chief Complaint  Patient presents with   Chest Pain    Valerie Wood is a 77 y.o. female.  77 yo F with a chief complaint of chest pain.  This been going on since last night.  Was not so severe and was able to go to bed woke up with worsening of the discomfort.  Radiates to the left arm and into the back.  Nothing seems to make it better or worse.  She denies cough congestion or fever denies trauma.  Denies shortness of breath with this.    Patient denies history of MI.  Has a history of hypertension hyperlipidemia and diabetes.  She was a former smoker for many years and has COPD.  Her mother had multiple heart attack she thinks starting about age 55.  Patient denies history of PE or DVT denies hemoptysis denies unilateral lower extremity edema denies recent surgery immobilization hospitalization estrogen use or history of cancer.     Chest Pain      Home Medications Prior to Admission medications   Medication Sig Start Date End Date Taking? Authorizing Provider  diclofenac Sodium (VOLTAREN) 1 % GEL Apply 4 g topically 4 (four) times daily. 04/03/22  Yes Deno Etienne, DO  Accu-Chek FastClix Lancets MISC  08/19/18   [provider]  ACCU-CHEK SMARTVIEW test strip  01/03/19   [provider]  aspirin EC 81 MG tablet Take 81 mg by mouth daily.    [provider]  atorvastatin (LIPITOR) 40 MG tablet Take 40 mg by mouth at bedtime.     [provider]  BD VEO INSULIN SYRINGE U/F 31G X 15/64" 0.5 ML MISC  08/28/18   [provider]  Biotin 5 MG TABS Take 5 mg by mouth at bedtime.     [provider]  celecoxib (CELEBREX) 200 MG capsule Take 1 capsule (200 mg total) by mouth 2 (two) times daily. Patient taking differently: Take 200 mg by mouth at bedtime. 10/03/17   Aundra Dubin, PA-C   cetirizine (ZYRTEC) 10 MG tablet Take 10 mg by mouth at bedtime.     [provider]  cholecalciferol (VITAMIN D) 1000 UNITS tablet Take 1,000 Units by mouth at bedtime.     [provider]  insulin NPH-regular Human (NOVOLIN 70/30) (70-30) 100 UNIT/ML injection Inject 30-40 Units into the skin 2 (two) times daily. Inject 40 units subcutaneously in the morning and 50 units at supper    [provider]  losartan (COZAAR) 50 MG tablet Take 50 mg by mouth at bedtime.    [provider]  Multiple Vitamin (MULTIVITAMIN WITH MINERALS) TABS tablet Take 1 tablet by mouth at bedtime.     [provider]  omeprazole (PRILOSEC) 20 MG capsule Take 20 mg by mouth at bedtime.     [provider]  ondansetron (ZOFRAN-ODT) 4 MG disintegrating tablet '4mg'$  ODT q4 hours prn nausea/vomit 04/03/22   Deno Etienne, DO  RELION PEN NEEDLES 32G X 4 MM MISC  12/19/18   [provider]  traZODone (DESYREL) 50 MG tablet Take 50 mg by mouth at bedtime.    [provider]      Allergies    Canagliflozin, Macrolides and ketolides, Morphine and related, Tetracyclines & related, Tramadol, Dilaudid [hydromorphone hcl], Doxycycline hyclate, Gabapentin, Glipizide, Iloperidone, Januvia [sitagliptin], Macrobid [nitrofurantoin  macrocrystal], Metformin, Nitrofurantoin, Pantoprazole, Symbicort [budesonide-formoterol fumarate], Victoza [liraglutide], and Ciprofloxacin hcl    Review of Systems   Review of Systems  Cardiovascular:  Positive for chest pain.    Physical Exam Updated Vital Signs BP (!) 149/60   Pulse 60   Temp 98.1 F (36.7 C) (Oral)   Resp (!) 26   Ht '5\' 3"'$  (1.6 m)   Wt 74.8 kg   SpO2 100%   BMI 29.23 kg/m  Physical Exam Vitals and nursing note reviewed.  Constitutional:      General: She is not in acute distress.    Appearance: She is well-developed. She is not diaphoretic.  HENT:     Head: Normocephalic and atraumatic.  Eyes:      Pupils: Pupils are equal, round, and reactive to light.  Cardiovascular:     Rate and Rhythm: Normal rate and regular rhythm.     Heart sounds: No murmur heard.    No friction rub. No gallop.  Pulmonary:     Effort: Pulmonary effort is normal.     Breath sounds: No wheezing or rales.  Abdominal:     General: There is no distension.     Palpations: Abdomen is soft.     Tenderness: There is abdominal tenderness.     Comments: Some epigastric pain to the abdomen as well as some pain along the left lateral chest wall.  Musculoskeletal:        General: No tenderness.     Cervical back: Normal range of motion and neck supple.  Skin:    General: Skin is warm and dry.  Neurological:     Mental Status: She is alert and oriented to person, place, and time.  Psychiatric:        Behavior: Behavior normal.     ED Results / Procedures / Treatments   Labs (all labs ordered are listed, but only abnormal results are displayed) Labs Reviewed  BASIC METABOLIC PANEL - Abnormal; Notable for the following components:      Result Value   Glucose, Bld 127 (*)    All other components within normal limits  HEPATIC FUNCTION PANEL - Abnormal; Notable for the following components:   Total Protein 6.4 (*)    All other components within normal limits  CBC  LIPASE, BLOOD  D-DIMER, QUANTITATIVE  CBG MONITORING, ED  TROPONIN I (HIGH SENSITIVITY)  TROPONIN I (HIGH SENSITIVITY)    EKG EKG Interpretation  Date/Time:  Monday April 03 2022 08:49:24 EST Ventricular Rate:  60 PR Interval:  185 QRS Duration: 103 QT Interval:  441 QTC Calculation: 441 R Axis:   -19 Text Interpretation: Sinus rhythm Abnormal R-wave progression, early transition Left ventricular hypertrophy No significant change since last tracing Confirmed by Deno Etienne [phone]) on 04/03/2022 9:02:04 AM  Radiology DG Chest 2 View  Result Date: 04/03/2022 CLINICAL DATA:  Left-sided chest pain radiating into the left arm associated  with nausea EXAM: CHEST - 2 VIEW COMPARISON:  Chest radiograph dated 04/28/2014 FINDINGS: Normal lung volumes. No focal consolidations. No pleural effusion or pneumothorax. Mildly enlarged cardiomediastinal silhouette. Multiple healed right rib fractures. IMPRESSION: 1. No active cardiopulmonary disease. 2. Mildly enlarged cardiomediastinal silhouette. Electronically Signed   By: Darrin Nipper M.D.   On: 04/03/2022 09:33    Procedures Procedures    Medications Ordered in ED Medications  sodium chloride 0.9 % bolus 1,000 mL (0 mLs Intravenous Stopped 04/03/22 1154)  ondansetron (ZOFRAN) injection 4 mg (4 mg Intravenous Given 04/03/22 0928)  fentaNYL (SUBLIMAZE) injection 50 mcg (50 mcg Intravenous Given 04/03/22 [id number])    ED Course/ Medical Decision Making/ A&P                             Medical Decision Making Amount and/or Complexity of Data Reviewed Labs: ordered. Radiology: ordered.  Risk Prescription drug management.   77 yo F with a chief complaints of left-sided chest pain.  Describes this as sharp and radiating into her left arm and into her back.  Seems to be reproducible with palpation and some movement at bedside.  Has some epigastric pain on exam as well.  Will obtain a delta troponin.  Treat pain and nausea.  LFTs lipase.  Reassess.  Patient feeling better on repeat assessment.  Second troponin is negative and has trended upwards from 6-12.  I discussed this with the patient.  Offered to observe her for 2 more hours and repeat the troponin.  Shared decision-making was held at bedside.  Patient elected to go home at this time.  Will follow-up with her PCP and cardiologist in the office.  12:17 PM:  I have discussed the diagnosis/risks/treatment options with the patient.  Evaluation and diagnostic testing in the emergency department does not suggest an emergent condition requiring admission or immediate intervention beyond what has been performed at this time.  They will follow up  with PCP. We also discussed returning to the ED immediately if new or worsening sx occur. We discussed the sx which are most concerning (e.g., sudden worsening pain, fever, inability to tolerate by mouth) that necessitate immediate return. Medications administered to the patient during their visit and any new prescriptions provided to the patient are listed below.  Medications given during this visit Medications  sodium chloride 0.9 % bolus 1,000 mL (0 mLs Intravenous Stopped 04/03/22 1154)  ondansetron (ZOFRAN) injection 4 mg (4 mg Intravenous Given 04/03/22 0928)  fentaNYL (SUBLIMAZE) injection 50 mcg (50 mcg Intravenous Given 04/03/22 [id number])     The patient appears reasonably screen and/or stabilized for discharge and I doubt any other medical condition or other [facility] requiring further screening, evaluation, or treatment in the ED at this time prior to discharge.         Final Clinical Impression(s) / ED Diagnoses Final diagnoses:  Nonspecific chest pain    Rx / DC Orders ED Discharge Orders          Ordered    ondansetron (ZOFRAN-ODT) 4 MG disintegrating tablet  Status:  Discontinued        04/03/22 1214    ondansetron (ZOFRAN-ODT) 4 MG disintegrating tablet        04/03/22 1215    diclofenac Sodium (VOLTAREN) 1 % GEL  4 times daily        04/03/22 1215    Ambulatory referral to Cardiology       Comments: If you have not heard from the Cardiology office within the next 72 hours please call [phone].   04/03/22 [place], [place], DO 04/03/22 1217

## 2022-04-03 NOTE — ED Triage Notes (Signed)
Pt bib EMS from home, 6-8pm last night shoulder pain radiating to left arm, 6am woke up and pain was in left side of chest and down left arm and shoulder blades, some nausea, hx of COPD, no new SOB.  Took 4 aspirin PTA.  PTA 20 ga in LAC, '4mg'$  of zofran, normal sinus.  Took thyroid med and insulin this morning.  CBG 130.

## 2022-04-03 NOTE — ED Notes (Signed)
Pt ambulatory to restroom, family at bedside, no needs verbalized at this time

## 2022-04-06 DIAGNOSIS — N179 Acute kidney failure, unspecified: Secondary | ICD-10-CM | POA: Diagnosis not present

## 2022-04-06 DIAGNOSIS — E039 Hypothyroidism, unspecified: Secondary | ICD-10-CM | POA: Diagnosis not present

## 2022-04-14 DIAGNOSIS — H353211 Exudative age-related macular degeneration, right eye, with active choroidal neovascularization: Secondary | ICD-10-CM | POA: Diagnosis not present

## 2022-04-18 DIAGNOSIS — E039 Hypothyroidism, unspecified: Secondary | ICD-10-CM | POA: Diagnosis not present

## 2022-04-18 DIAGNOSIS — E119 Type 2 diabetes mellitus without complications: Secondary | ICD-10-CM | POA: Diagnosis not present

## 2022-04-18 DIAGNOSIS — I10 Essential (primary) hypertension: Secondary | ICD-10-CM | POA: Diagnosis not present

## 2022-04-18 DIAGNOSIS — M810 Age-related osteoporosis without current pathological fracture: Secondary | ICD-10-CM | POA: Diagnosis not present

## 2022-04-18 DIAGNOSIS — E041 Nontoxic single thyroid nodule: Secondary | ICD-10-CM | POA: Diagnosis not present

## 2022-04-18 DIAGNOSIS — Z8639 Personal history of other endocrine, nutritional and metabolic disease: Secondary | ICD-10-CM | POA: Diagnosis not present

## 2022-04-18 DIAGNOSIS — E063 Autoimmune thyroiditis: Secondary | ICD-10-CM | POA: Diagnosis not present

## 2022-04-18 DIAGNOSIS — R413 Other amnesia: Secondary | ICD-10-CM | POA: Diagnosis not present

## 2022-04-18 DIAGNOSIS — Z794 Long term (current) use of insulin: Secondary | ICD-10-CM | POA: Diagnosis not present

## 2022-05-03 DIAGNOSIS — E039 Hypothyroidism, unspecified: Secondary | ICD-10-CM | POA: Diagnosis not present

## 2022-05-03 DIAGNOSIS — E119 Type 2 diabetes mellitus without complications: Secondary | ICD-10-CM | POA: Diagnosis not present

## 2022-05-08 DIAGNOSIS — S20219A Contusion of unspecified front wall of thorax, initial encounter: Secondary | ICD-10-CM | POA: Diagnosis not present

## 2022-05-08 DIAGNOSIS — S0083XA Contusion of other part of head, initial encounter: Secondary | ICD-10-CM | POA: Diagnosis not present

## 2022-05-08 DIAGNOSIS — S8000XA Contusion of unspecified knee, initial encounter: Secondary | ICD-10-CM | POA: Diagnosis not present

## 2022-05-16 ENCOUNTER — Ambulatory Visit: Payer: Medicare HMO | Attending: Cardiology | Admitting: Cardiology

## 2022-05-16 ENCOUNTER — Encounter: Payer: Self-pay | Admitting: Cardiology

## 2022-05-16 VITALS — BP 142/86 | HR 92 | Ht 63.0 in | Wt 166.2 lb

## 2022-05-16 DIAGNOSIS — I10 Essential (primary) hypertension: Secondary | ICD-10-CM | POA: Diagnosis not present

## 2022-05-16 DIAGNOSIS — R0789 Other chest pain: Secondary | ICD-10-CM | POA: Diagnosis not present

## 2022-05-16 DIAGNOSIS — I2510 Atherosclerotic heart disease of native coronary artery without angina pectoris: Secondary | ICD-10-CM | POA: Diagnosis not present

## 2022-05-16 DIAGNOSIS — I2583 Coronary atherosclerosis due to lipid rich plaque: Secondary | ICD-10-CM | POA: Diagnosis not present

## 2022-05-16 NOTE — Patient Instructions (Signed)
Medication Instructions:  Your physician recommends that you continue on your current medications as directed. Please refer to the Current Medication list given to you today.  *If you need a refill on your cardiac medications before your next appointment, please call your pharmacy*   Lab Work: None.  If you have labs (blood work) drawn today and your tests are completely normal, you will receive your results only by: MyChart Message (if you have MyChart) OR A paper copy in the mail If you have any lab test that is abnormal or we need to change your treatment, we will call you to review the results.   Testing/Procedures: How to Prepare for Your Cardiac PET/CT Stress Test:  1. Please do not take these medications before your test:   Medications that may interfere with the cardiac pharmacological stress agent (ex. nitrates - including erectile dysfunction medications, isosorbide mononitrate, tamulosin or beta-blockers) the day of the exam. (Erectile dysfunction medication should be held for at least 72 hrs prior to test) Your remaining medications may be taken with water.  2. Nothing to eat or drink, except water, 3 hours prior to arrival time.   NO caffeine/decaffeinated products, or chocolate 12 hours prior to arrival.  3. NO perfume, cologne or lotion  4. Total time is 1 to 2 hours; you may want to bring reading material for the waiting time.  5. Please report to Radiology at the [facility] Main Entrance 30 minutes early for your test.  [address] [place], [place] [address]  Diabetic Preparation:  Hold oral medications. You may take NPH and Lantus insulin. Do not take Humalog or Humulin R (Regular Insulin) the day of your test. Check blood sugars prior to leaving the house. If able to eat breakfast prior to 3 hour fasting, you may take all medications, including your insulin, Do not worry if you miss your breakfast dose of insulin - start at your next  meal.  IF YOU THINK YOU MAY BE PREGNANT, OR ARE NURSING PLEASE INFORM THE TECHNOLOGIST.  In preparation for your appointment, medication and supplies will be purchased.  Appointment availability is limited, so if you need to cancel or reschedule, please call the Radiology Department at [phone]  24 hours in advance to avoid a cancellation fee of $100.00  What to Expect After you Arrive:  Once you arrive and check in for your appointment, you will be taken to a preparation room within the Radiology Department.  A technologist or Nurse will obtain your medical history, verify that you are correctly prepped for the exam, and explain the procedure.  Afterwards,  an IV will be started in your arm and electrodes will be placed on your skin for EKG monitoring during the stress portion of the exam. Then you will be escorted to the PET/CT scanner.  There, staff will get you positioned on the scanner and obtain a blood pressure and EKG.  During the exam, you will continue to be connected to the EKG and blood pressure machines.  A small, safe amount of a radioactive tracer will be injected in your IV to obtain a series of pictures of your heart along with an injection of a stress agent.    After your Exam:  It is recommended that you eat a meal and drink a caffeinated beverage to counter act any effects of the stress agent.  Drink plenty of fluids for the remainder of the day and urinate frequently for the first couple of hours  after the exam.  Your doctor will inform you of your test results within 7-10 business days.  For questions about your test or how to prepare for your test, please call: Rockwell Alexandria, Cardiac Imaging Nurse Navigator  Larey Brick, Cardiac Imaging Nurse Navigator Office: [phone]     Follow-Up:  Your next appointment:   1 year(s)  Provider:   Dr. Armanda Magic, MD

## 2022-05-16 NOTE — Addendum Note (Signed)
Addended by: ,  L on: 05/16/2022 10:03 AM   Modules accepted: Orders  

## 2022-05-16 NOTE — Progress Notes (Addendum)
Cardiology CONSULT Note    Date:  05/16/2022   ID:  Valerie Wood, DOB Oct 08, 1945, MRN [id number]  PCP:  Lupita Raider, MD  Cardiologist:  Armanda Magic, MD   Chief Complaint  Patient presents with   New Patient (Initial Visit)    Chest pain    History of Present Illness:  Valerie Wood is a 77 y.o. female who is being seen today for the evaluation of chest pain at the request of Melene Plan, DO.  This is a 77 year old female with a history of anxiety, diabetes mellitus, fibromyalgia, gastroparesis due to diabetes mellitus, GERD, hyperlipidemia, hypertension.  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 It was felt her CP was related to significant stress.  She had a normal nuclear stress test in 2016 with no ischemia.  This was done for chest pain at that time.  She recently had some CP and was seen in the ER for CP.  She says that she was in her chair and started having pain in her chest mid sternal with radiation into her left shoulder.  There was no associated nausea, diaphoresis or SOB. hsTrop was normal at 6>>12 and EKG was nonischemic.  She is now referred for further evaluation.  She has not had any further CP since that episode.    Cardiac Studies & Procedures     STRESS TESTS  NM MYOCAR MULTI W/SPECT W 04/29/2014  Narrative CLINICAL DATA:  Chest pain, diabetes, hypertension and coronary artery disease.  EXAM: MYOCARDIAL IMAGING WITH SPECT (REST AND EXERCISE)  GATED LEFT VENTRICULAR WALL MOTION STUDY  LEFT VENTRICULAR EJECTION FRACTION  TECHNIQUE: Standard myocardial SPECT imaging was performed after resting intravenous injection of 10 mCi Tc-73m sestamibi. Subsequently, exercise tolerance test was performed by the patient under the supervision of the Cardiology staff. At peak-stress, 30 mCi Tc-53m sestamibi was  injected intravenously and standard myocardial SPECT imaging was performed. Quantitative gated imaging was also performed to evaluate left ventricular wall motion, and estimate left ventricular ejection fraction.  COMPARISON:  None.  FINDINGS: Perfusion: No decreased activity in the left ventricle on stress imaging to suggest reversible ischemia or infarction.  Wall Motion: Normal left ventricular wall motion. No left ventricular dilation.  Left Ventricular Ejection Fraction: 57 %  End diastolic volume 62 ml  End systolic volume 26 ml  IMPRESSION: 1. No reversible ischemia or infarction.  2. Normal left ventricular wall motion.  3. Left ventricular ejection fraction 57%  4. Low/Intermediate/High-risk stress test findings*.  *2012 Appropriate Use Criteria for Coronary Revascularization Focused Update: J Am Coll Cardiol. 2012;59(9):857-881. http://content.dementiazones.com.aspx?articleid=1201161   Electronically Signed By: Rudie Meyer M.D. On: 04/29/2014 13:36   ECHOCARDIOGRAM  ECHOCARDIOGRAM COMPLETE 03/04/2019  Narrative ECHOCARDIOGRAM REPORT    Patient Name:   Valerie Wood Date of Exam: 03/04/2019 Medical Rec #:  [id number]     Height:       63.0 in Accession #:    [id number]    Weight:       181.0 lb Date of Birth:  12-12-1945     BSA:          1.85 m Patient Age:    73 years      BP:           135/75 mmHg Patient Gender: F  HR:           77 bpm. Exam Location:  Inpatient  Procedure: 2D Echo  Indications:    Stroke 434.91 / I163.9  History:        Patient has no prior history of Echocardiogram examinations. NSTEMI and CAD, Signs/Symptoms:Chest Pain; Risk Factors:Hypertension, Diabetes and Dyslipidemia.  Sonographer:    Leeroy Bockhelsea Turrentine Referring Phys: [id number] VASUNDHRA RATHORE  IMPRESSIONS   1. Left ventricular ejection fraction, by visual estimation, is 50 to 55%. The left ventricle has low normal function. There is no left  ventricular hypertrophy. 2. Mild hypokinesis of the left ventricular, basal-mid inferior wall. 3. Left ventricular diastolic parameters are consistent with Grade I diastolic dysfunction (impaired relaxation). 4. The left ventricle demonstrates regional wall motion abnormalities. 5. Global right ventricle has normal systolic function.The right ventricular size is normal. No increase in right ventricular wall thickness. 6. Left atrial size was normal. 7. Right atrial size was normal. 8. The mitral valve is normal in structure. No evidence of mitral valve regurgitation. No evidence of mitral stenosis. 9. The tricuspid valve is normal in structure. 10. The tricuspid valve is normal in structure. Tricuspid valve regurgitation is not demonstrated. 11. The aortic valve is normal in structure. Aortic valve regurgitation is not visualized. No evidence of aortic valve sclerosis or stenosis. 12. The pulmonic valve was normal in structure. Pulmonic valve regurgitation is not visualized. 13. Normal pulmonary artery systolic pressure. 14. The inferior vena cava is normal in size with greater than 50% respiratory variability, suggesting right atrial pressure of 3 mmHg.  FINDINGS Left Ventricle: Left ventricular ejection fraction, by visual estimation, is 50 to 55%. The left ventricle has low normal function. Mild hypokinesis of the left ventricular, basal-mid inferior wall. The left ventricle demonstrates regional wall motion abnormalities. There is no left ventricular hypertrophy. Left ventricular diastolic parameters are consistent with Grade I diastolic dysfunction (impaired relaxation). Normal left atrial pressure.  Right Ventricle: The right ventricular size is normal. No increase in right ventricular wall thickness. Global RV systolic function is has normal systolic function. The tricuspid regurgitant velocity is 2.08 m/s, and with an assumed right atrial pressure of 3 mmHg, the estimated right  ventricular systolic pressure is normal at 20.3 mmHg.  Left Atrium: Left atrial size was normal in size.  Right Atrium: Right atrial size was normal in size  Pericardium: There is no evidence of pericardial effusion.  Mitral Valve: The mitral valve is normal in structure. No evidence of mitral valve regurgitation. No evidence of mitral valve stenosis by observation.  Tricuspid Valve: The tricuspid valve is normal in structure. Tricuspid valve regurgitation is not demonstrated.  Aortic Valve: The aortic valve is normal in structure. Aortic valve regurgitation is not visualized. The aortic valve is structurally normal, with no evidence of sclerosis or stenosis.  Pulmonic Valve: The pulmonic valve was normal in structure. Pulmonic valve regurgitation is not visualized. Pulmonic regurgitation is not visualized.  Aorta: The aortic root, ascending aorta and aortic arch are all structurally normal, with no evidence of dilitation or obstruction.  Venous: The inferior vena cava is normal in size with greater than 50% respiratory variability, suggesting right atrial pressure of 3 mmHg.  IAS/Shunts: No atrial level shunt detected by color flow Doppler. There is no evidence of a patent foramen ovale. No ventricular septal defect is seen or detected. There is no evidence of an atrial septal defect.   LEFT VENTRICLE PLAX 2D LVIDd:  4.90 cm  Diastology LVIDs:         3.10 cm  LV e' lateral:   7.07 cm/s LV PW:         1.10 cm  LV E/e' lateral: 8.1 LV IVS:        1.10 cm  LV e' medial:    5.77 cm/s LVOT diam:     2.00 cm  LV E/e' medial:  9.9 LV SV:         75 ml LV SV Index:   38.57 LVOT Area:     3.14 cm   RIGHT VENTRICLE RV S prime:     9.57 cm/s TAPSE (M-mode): 1.2 cm  LEFT ATRIUM             Index       RIGHT ATRIUM           Index LA diam:        4.30 cm 2.32 cm/m  RA Area:     13.90 cm LA Vol (A2C):   45.1 ml 24.33 ml/m RA Volume:   31.80 ml  17.16 ml/m LA Vol (A4C):    46.8 ml 25.25 ml/m LA Biplane Vol: 46.0 ml 24.82 ml/m AORTIC VALVE LVOT Vmax:   96.60 cm/s LVOT Vmean:  66.700 cm/s LVOT VTI:    0.183 m  AORTA Ao Root diam: 3.10 cm  MITRAL VALVE                         TRICUSPID VALVE MV Area (PHT): 3.63 cm              TR Peak grad:   17.3 mmHg MV PHT:        60.61 msec            TR Vmax:        208.00 cm/s MV Decel Time: 209 msec MV E velocity: 57.00 cm/s  103 cm/s  SHUNTS MV A velocity: 102.00 cm/s 70.3 cm/s Systemic VTI:  0.18 m MV E/A ratio:  0.56        1.5       Systemic Diam: 2.00 cm   Mihai Croitoru MD Electronically signed by Thurmon Fair MD Signature Date/Time: 03/04/2019/10:08:50 AM    Final    MONITORS  CARDIAC EVENT MONITOR 05/11/2019  Narrative Sinus rhythm Rare premature atrial contractions No atrial fibrillation Baseline artifact limits interpretation at times If further monitoring for afib is desired given stroke, consider EP referral for long term monitoring           Past Medical History:  Diagnosis Date   Anxiety    Arthritis    hands, all over, back   Atherosclerosis 2017   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 Meisinger's nurse states that pt is type 2 diabetic, controlled by diet.   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   PONV (postoperative nausea and vomiting)     Rectocele    History of   Right rib fracture 12/20/2013   Seasonal allergies    Shortness of breath    quit smoking 2013   Skin cancer    Nose   Stricture and stenosis of esophagus    Thyroid nodule    Bilateral   Tobacco dependence    Uterine cancer 1989   Vertigo    Vitamin D deficiency     Past Surgical History:  Procedure Laterality Date   APPENDECTOMY     BLADDER SURGERY     Tack    CHOLECYSTECTOMY     COLONOSCOPY  last one 11/09/2017   multiple   DILATION AND EVACUATION     ESOPHAGOGASTRODUODENOSCOPY  last one 12/06/2004   multiple   LEEP     PARATHYROIDECTOMY Right 03/15/2018   Procedure: RIGHT INFERIOR PARATHYROIDECTOMY;  Surgeon: Darnell Level, MD;  Location: [facility];  Service: General;  Laterality: Right;   reclast     2019   RECTOCELE REPAIR  05/11/2011   Procedure: POSTERIOR REPAIR (RECTOCELE);  Surgeon: Lavina Hamman, MD;  Location: [facility];  Service: Gynecology;  Laterality: N/A;   TONSILLECTOMY     TUBAL LIGATION     VAGINAL HYSTERECTOMY  12/1987   BSO    Current Medications: Current Meds  Medication Sig   Accu-Chek FastClix Lancets MISC    ACCU-CHEK SMARTVIEW test strip    aspirin EC 81 MG tablet Take 81 mg by mouth daily.   atorvastatin (LIPITOR) 40 MG tablet Take 40 mg by mouth at bedtime.    BD VEO INSULIN SYRINGE U/F 31G X 15/64" 0.5 ML MISC    Biotin 5 MG TABS Take 5 mg by mouth at bedtime.    celecoxib (CELEBREX) 200 MG capsule Take 1 capsule (200 mg total) by mouth 2 (two) times daily. (Patient taking differently: Take 200 mg by mouth at bedtime.)   cephALEXin (KEFLEX) 250 MG capsule Take 250 mg by mouth daily.   cetirizine (ZYRTEC) 10 MG tablet Take 10 mg by mouth at bedtime.    cholecalciferol (VITAMIN D) 1000 UNITS tablet Take 1,000 Units by mouth at bedtime.    diclofenac Sodium (VOLTAREN) 1 % GEL Apply 4 g topically 4 (four) times daily.   insulin NPH-regular Human (NOVOLIN 70/30) (70-30) 100 UNIT/ML injection Inject 30-40 Units into the  skin 2 (two) times daily. Inject 40 units subcutaneously in the morning and 50 units at supper   levothyroxine (SYNTHROID) 75 MCG tablet Take 75 mcg by mouth every morning.   losartan (COZAAR) 50 MG tablet Take 50 mg by mouth at bedtime.   Multiple Vitamin (MULTIVITAMIN WITH MINERALS) TABS tablet Take 1 tablet by mouth at bedtime.    omeprazole (PRILOSEC) 20 MG capsule Take 20 mg by mouth at bedtime.    ondansetron (ZOFRAN-ODT) 4 MG disintegrating tablet 4mg  ODT q4 hours prn nausea/vomit   RELION PEN NEEDLES 32G X 4 MM MISC    RESTASIS 0.05 % ophthalmic emulsion Place 1 drop into both eyes 2 (two) times daily.   traZODone (DESYREL) 50 MG tablet Take 50 mg by mouth at bedtime.    Allergies:   Canagliflozin, Macrolides and ketolides, Morphine and related, Tetracyclines & related, Tramadol, Dilaudid [  hydromorphone hcl], Doxycycline hyclate, Gabapentin, Glipizide, Iloperidone, Januvia [sitagliptin], Macrobid [nitrofurantoin macrocrystal], Metformin, Nitrofurantoin, Pantoprazole, Symbicort [budesonide-formoterol fumarate], Victoza [liraglutide], and Ciprofloxacin hcl   Social History   Socioeconomic History   Marital status: Widowed    Spouse name: Not on file   Number of children: 2   Years of education: Not on file   Highest education level: Not on file  Occupational History   Occupation: Retired    Comment: Marketing   Tobacco Use   Smoking status: Former    Packs/day: 1.00    Years: 40.00    Additional pack years: 0.00    Total pack years: 40.00    Types: Cigarettes    Quit date: 02/22/2011    Years since quitting: 11.2   Smokeless tobacco: Never  Vaping Use   Vaping Use: Never used  Substance and Sexual Activity   Alcohol use: No   Drug use: No   Sexual activity: Not on file  Other Topics Concern   Not on file  Social History Narrative   Caffeine daily    Social Determinants of Health   Financial Resource Strain: Not on file  Food Insecurity: Not on file   Transportation Needs: Not on file  Physical Activity: Not on file  Stress: Not on file  Social Connections: Not on file     Family History:  The patient's family history includes Colon cancer (age of onset: 58) in her father; Heart disease in her mother; Stomach cancer in her father.   ROS:   Please see the history of present illness.    ROS All other systems reviewed and are negative.      No data to display             PHYSICAL EXAM:   VS:  BP (!) 142/86   Pulse 92   Ht 5\' 3"  (1.6 m)   Wt 166 lb 3.2 oz (75.4 kg)   SpO2 96%   BMI 29.44 kg/m    GEN: Well nourished, well developed, in no acute distress  HEENT: normal  Neck: no JVD, carotid bruits, or masses Cardiac: RRR; no murmurs, rubs, or gallops,no edema.  Intact distal pulses bilaterally.  Respiratory:  clear to auscultation bilaterally, normal work of breathing GI: soft, nontender, nondistended, + BS MS: no deformity or atrophy  Skin: warm and dry, no rash Neuro:  Alert and Oriented x 3, Strength and sensation are intact Psych: euthymic mood, full affect  Wt Readings from Last 3 Encounters:  05/16/22 166 lb 3.2 oz (75.4 kg)  04/03/22 165 lb (74.8 kg)  05/17/20 175 lb (79.4 kg)      Studies/Labs Reviewed:   EKG:  EKG is ordered today.  The ekg ordered today demonstrates NSR with LVH by voltage  Cardiac Studies & Procedures     STRESS TESTS  NM MYOCAR MULTI W/SPECT W 04/29/2014  Narrative CLINICAL DATA:  Chest pain, diabetes, hypertension and coronary artery disease.  EXAM: MYOCARDIAL IMAGING WITH SPECT (REST AND EXERCISE)  GATED LEFT VENTRICULAR WALL MOTION STUDY  LEFT VENTRICULAR EJECTION FRACTION  TECHNIQUE: Standard myocardial SPECT imaging was performed after resting intravenous injection of 10 mCi Tc-63m sestamibi. Subsequently, exercise tolerance test was performed by the patient under the supervision of the Cardiology staff. At peak-stress, 30 mCi Tc-49m sestamibi was injected  intravenously and standard myocardial SPECT imaging was performed. Quantitative gated imaging was also performed to evaluate left ventricular wall motion, and estimate left ventricular ejection fraction.  COMPARISON:  None.  FINDINGS: Perfusion: No decreased activity in the left ventricle on stress imaging to suggest reversible ischemia or infarction.  Wall Motion: Normal left ventricular wall motion. No left ventricular dilation.  Left Ventricular Ejection Fraction: 57 %  End diastolic volume 62 ml  End systolic volume 26 ml  IMPRESSION: 1. No reversible ischemia or infarction.  2. Normal left ventricular wall motion.  3. Left ventricular ejection fraction 57%  4. Low/Intermediate/High-risk stress test findings*.  *2012 Appropriate Use Criteria for Coronary Revascularization Focused Update: J Am Coll Cardiol. 2012;59(9):857-881. http://content.dementiazones.com.aspx?articleid=1201161   Electronically Signed By: Rudie Meyer M.D. On: 04/29/2014 13:36   ECHOCARDIOGRAM  ECHOCARDIOGRAM COMPLETE 03/04/2019  Narrative ECHOCARDIOGRAM REPORT    Patient Name:   Valerie Wood Date of Exam: 03/04/2019 Medical Rec #:  [id number]     Height:       63.0 in Accession #:    [id number]    Weight:       181.0 lb Date of Birth:  1945-11-03     BSA:          1.85 m Patient Age:    73 years      BP:           135/75 mmHg Patient Gender: F             HR:           77 bpm. Exam Location:  Inpatient  Procedure: 2D Echo  Indications:    Stroke 434.91 / I163.9  History:        Patient has no prior history of Echocardiogram examinations. NSTEMI and CAD, Signs/Symptoms:Chest Pain; Risk Factors:Hypertension, Diabetes and Dyslipidemia.  Sonographer:    Leeroy Bock Turrentine Referring Phys: [id number] VASUNDHRA RATHORE  IMPRESSIONS   1. Left ventricular ejection fraction, by visual estimation, is 50 to 55%. The left ventricle has low normal function. There is no left ventricular  hypertrophy. 2. Mild hypokinesis of the left ventricular, basal-mid inferior wall. 3. Left ventricular diastolic parameters are consistent with Grade I diastolic dysfunction (impaired relaxation). 4. The left ventricle demonstrates regional wall motion abnormalities. 5. Global right ventricle has normal systolic function.The right ventricular size is normal. No increase in right ventricular wall thickness. 6. Left atrial size was normal. 7. Right atrial size was normal. 8. The mitral valve is normal in structure. No evidence of mitral valve regurgitation. No evidence of mitral stenosis. 9. The tricuspid valve is normal in structure. 10. The tricuspid valve is normal in structure. Tricuspid valve regurgitation is not demonstrated. 11. The aortic valve is normal in structure. Aortic valve regurgitation is not visualized. No evidence of aortic valve sclerosis or stenosis. 12. The pulmonic valve was normal in structure. Pulmonic valve regurgitation is not visualized. 13. Normal pulmonary artery systolic pressure. 14. The inferior vena cava is normal in size with greater than 50% respiratory variability, suggesting right atrial pressure of 3 mmHg.  FINDINGS Left Ventricle: Left ventricular ejection fraction, by visual estimation, is 50 to 55%. The left ventricle has low normal function. Mild hypokinesis of the left ventricular, basal-mid inferior wall. The left ventricle demonstrates regional wall motion abnormalities. There is no left ventricular hypertrophy. Left ventricular diastolic parameters are consistent with Grade I diastolic dysfunction (impaired relaxation). Normal left atrial pressure.  Right Ventricle: The right ventricular size is normal. No increase in right ventricular wall thickness. Global RV systolic function is has normal systolic function. The tricuspid regurgitant velocity is 2.08 m/s, and with an assumed right atrial pressure of 3 mmHg,  the estimated right ventricular systolic  pressure is normal at 20.3 mmHg.  Left Atrium: Left atrial size was normal in size.  Right Atrium: Right atrial size was normal in size  Pericardium: There is no evidence of pericardial effusion.  Mitral Valve: The mitral valve is normal in structure. No evidence of mitral valve regurgitation. No evidence of mitral valve stenosis by observation.  Tricuspid Valve: The tricuspid valve is normal in structure. Tricuspid valve regurgitation is not demonstrated.  Aortic Valve: The aortic valve is normal in structure. Aortic valve regurgitation is not visualized. The aortic valve is structurally normal, with no evidence of sclerosis or stenosis.  Pulmonic Valve: The pulmonic valve was normal in structure. Pulmonic valve regurgitation is not visualized. Pulmonic regurgitation is not visualized.  Aorta: The aortic root, ascending aorta and aortic arch are all structurally normal, with no evidence of dilitation or obstruction.  Venous: The inferior vena cava is normal in size with greater than 50% respiratory variability, suggesting right atrial pressure of 3 mmHg.  IAS/Shunts: No atrial level shunt detected by color flow Doppler. There is no evidence of a patent foramen ovale. No ventricular septal defect is seen or detected. There is no evidence of an atrial septal defect.   LEFT VENTRICLE PLAX 2D LVIDd:         4.90 cm  Diastology LVIDs:         3.10 cm  LV e' lateral:   7.07 cm/s LV PW:         1.10 cm  LV E/e' lateral: 8.1 LV IVS:        1.10 cm  LV e' medial:    5.77 cm/s LVOT diam:     2.00 cm  LV E/e' medial:  9.9 LV SV:         75 ml LV SV Index:   38.57 LVOT Area:     3.14 cm   RIGHT VENTRICLE RV S prime:     9.57 cm/s TAPSE (M-mode): 1.2 cm  LEFT ATRIUM             Index       RIGHT ATRIUM           Index LA diam:        4.30 cm 2.32 cm/m  RA Area:     13.90 cm LA Vol (A2C):   45.1 ml 24.33 ml/m RA Volume:   31.80 ml  17.16 ml/m LA Vol (A4C):   46.8 ml 25.25 ml/m LA  Biplane Vol: 46.0 ml 24.82 ml/m AORTIC VALVE LVOT Vmax:   96.60 cm/s LVOT Vmean:  66.700 cm/s LVOT VTI:    0.183 m  AORTA Ao Root diam: 3.10 cm  MITRAL VALVE                         TRICUSPID VALVE MV Area (PHT): 3.63 cm              TR Peak grad:   17.3 mmHg MV PHT:        60.61 msec            TR Vmax:        208.00 cm/s MV Decel Time: 209 msec MV E velocity: 57.00 cm/s  103 cm/s  SHUNTS MV A velocity: 102.00 cm/s 70.3 cm/s Systemic VTI:  0.18 m MV E/A ratio:  0.56        1.5       Systemic Diam: 2.00 cm  Thurmon Fair MD Electronically signed by Thurmon Fair MD Signature Date/Time: 03/04/2019/10:08:50 AM    Final    MONITORS  CARDIAC EVENT MONITOR 05/11/2019  Narrative Sinus rhythm Rare premature atrial contractions No atrial fibrillation Baseline artifact limits interpretation at times If further monitoring for afib is desired given stroke, consider EP referral for long term monitoring           Recent Labs: 04/03/2022: ALT 21; BUN 19; Creatinine, Ser 0.96; Hemoglobin 12.1; Platelets 183; Potassium 3.7; Sodium 137   Lipid Panel    Component Value Date/Time   CHOL 111 03/04/2019 0227   TRIG 108 03/04/2019 0227   HDL 37 (L) 03/04/2019 0227   CHOLHDL 3.0 03/04/2019 0227   VLDL 22 03/04/2019 0227   LDLCALC 52 03/04/2019 0227    Additional studies/ records that were reviewed today include:  none    ASSESSMENT:    1. Other chest pain   2. Benign essential HTN      PLAN:  In order of problems listed above:  Chest pain/ASCAD -Nuclear stress test done in 2016 for chest pain showed no ischemia  -cath 04/2014 showed 30% mLAD and oD1 otherwise normal.  -she has multiple cardiac risk factors including hypertension, hyperlipidemia, diabetes mellitus and may have progression of her CAD -I will get Stress PET CT to rule out ischemia -Shared Decision Making/Informed Consent The risks [chest pain, shortness of breath, cardiac arrhythmias, dizziness, blood  pressure fluctuations, myocardial infarction, stroke/transient ischemic attack, nausea, vomiting, allergic reaction, radiation exposure, metallic taste sensation and life-threatening complications (estimated to be 1 in 10,000)], benefits (risk stratification, diagnosing coronary artery disease, treatment guidance) and alternatives of a cardiac PET stress test were discussed in detail with Ms. Blanchet and she agrees to proceed. -continue ASA 81mg  daily and statin  2.  Hypertension -BP controlled on exam -Continue prescription drug management with losartan 50 mg daily with as needed refills  3.  HLD -LDL goal < 70 -I have personally reviewed and interpreted outside labs performed by patient's PCP which showed LDL 56 and HDL 41 on 03/2022 -continue prescription drug management with Atorvastatin 40nmg daily with PRN refills  Time Spent: 20 minutes total time of encounter, including 15 minutes spent in face-to-face patient care on the date of this encounter. This time includes coordination of care and counseling regarding above mentioned problem list. Remainder of non-face-to-face time involved reviewing chart documents/testing relevant to the patient encounter and documentation in the medical record. I have independently reviewed documentation from referring provider  Followup: 1 year if stress test normal  Medication Adjustments/Labs and Tests Ordered: Current medicines are reviewed at length with the patient today.  Concerns regarding medicines are outlined above.  Medication changes, Labs and Tests ordered today are listed in the Patient Instructions below.  There are no Patient Instructions on file for this visit.   Signed, Armanda Magic, MD  05/16/2022 9:36 AM    [facility] [address] [place], [place], [place]  [address] Phone: [phone]; Fax: [phone]

## 2022-05-16 NOTE — Addendum Note (Signed)
Addended by: Luellen Pucker on: 05/16/2022 10:03 AM   Modules accepted: Orders

## 2022-05-23 NOTE — Addendum Note (Signed)
Addended by: ,  R on: 05/23/2022 09:14 AM   Modules accepted: Orders  

## 2022-05-26 DIAGNOSIS — H43813 Vitreous degeneration, bilateral: Secondary | ICD-10-CM | POA: Diagnosis not present

## 2022-05-26 DIAGNOSIS — H353211 Exudative age-related macular degeneration, right eye, with active choroidal neovascularization: Secondary | ICD-10-CM | POA: Diagnosis not present

## 2022-05-26 DIAGNOSIS — H353121 Nonexudative age-related macular degeneration, left eye, early dry stage: Secondary | ICD-10-CM | POA: Diagnosis not present

## 2022-05-31 ENCOUNTER — Encounter: Payer: Self-pay | Admitting: Orthopaedic Surgery

## 2022-05-31 ENCOUNTER — Ambulatory Visit: Payer: Medicare HMO | Admitting: Orthopaedic Surgery

## 2022-05-31 DIAGNOSIS — M65331 Trigger finger, right middle finger: Secondary | ICD-10-CM | POA: Diagnosis not present

## 2022-05-31 MED ORDER — METHYLPREDNISOLONE ACETATE 40 MG/ML IJ SUSP
13.3300 mg | INTRAMUSCULAR | Status: AC | PRN
Start: 2022-05-31 — End: 2022-05-31
  Administered 2022-05-31: 13.33 mg

## 2022-05-31 MED ORDER — LIDOCAINE HCL 1 % IJ SOLN
0.3000 mL | INTRAMUSCULAR | Status: AC | PRN
Start: 2022-05-31 — End: 2022-05-31
  Administered 2022-05-31: .3 mL

## 2022-05-31 MED ORDER — BUPIVACAINE HCL 0.5 % IJ SOLN
0.3300 mL | INTRAMUSCULAR | Status: AC | PRN
  Administered 2022-05-31: .33 mL

## 2022-05-31 NOTE — Progress Notes (Signed)
Office Visit Note   Patient: Valerie Wood           Date of Birth: [birth year]           MRN: [id number] Visit Date: 05/31/2022              Requested by: Lupita Raider, MD [address] [place],  [place] [address] PCP: Lupita Raider, MD   Assessment & Plan: Visit Diagnoses:  1. Trigger finger, right middle finger     Plan: Impression is a 77 year old female with recurrent right middle trigger finger.  Previous injection worked really well from 8 years ago.  She would like to have this injection done again.  She tolerated well.  Follow-up as needed.  Follow-Up Instructions: No follow-ups on file.   Orders:  No orders of the defined types were placed in this encounter.  No orders of the defined types were placed in this encounter.     Procedures: Hand/UE Inj: R long A1 for trigger finger on 05/31/2022 8:45 AM Indications: pain Details: 25 G needle Medications: 0.3 mL lidocaine 1 %; 0.33 mL bupivacaine 0.5 %; 13.33 mg methylPREDNISolone acetate 40 MG/ML Outcome: tolerated well, no immediate complications Consent was given by the patient. Patient was prepped and draped in the usual sterile fashion.       Clinical Data: No additional findings.   Subjective: Chief Complaint  Patient presents with   Right Hand - Pain    Middle finger    HPI  Normal returns today for recurrent right middle trigger finger.  Had this issue about 8 years ago that I saw her for and we did an injection which resolved.  She has noticed this has come back.  Review of Systems  Constitutional: Negative.   HENT: Negative.    Eyes: Negative.   Respiratory: Negative.    Cardiovascular: Negative.   Endocrine: Negative.   Musculoskeletal: Negative.   Neurological: Negative.   Hematological: Negative.   Psychiatric/Behavioral: Negative.    All other systems reviewed and are negative.    Objective: Vital Signs: There were no vitals taken for this visit.  Physical  Exam Vitals and nursing note reviewed.  Constitutional:      Appearance: She is well-developed.  HENT:     Head: Atraumatic.     Nose: Nose normal.  Eyes:     Extraocular Movements: Extraocular movements intact.  Cardiovascular:     Pulses: Normal pulses.  Pulmonary:     Effort: Pulmonary effort is normal.  Abdominal:     Palpations: Abdomen is soft.  Musculoskeletal:     Cervical back: Neck supple.  Skin:    General: Skin is warm.     Capillary Refill: Capillary refill takes less than 2 seconds.  Neurological:     Mental Status: She is alert. Mental status is at baseline.  Psychiatric:        Behavior: Behavior normal.        Thought Content: Thought content normal.        Judgment: Judgment normal.     Ortho Exam  Examination right middle finger shows locking and tenderness of the A1 pulley.  Specialty Comments:  No specialty comments available.  Imaging: No results found.   PMFS History: Patient Active Problem List   Diagnosis Date Noted   Acute metabolic encephalopathy 03/04/2019   Aphasia 03/03/2019   UTI (urinary tract infection) 03/03/2019   Vulvovaginitis 01/13/2019   Preoperative clearance 03/12/2018   CAD (coronary  artery disease), native coronary artery 03/12/2018   Hyperparathyroidism, primary 03/09/2018   Acute pain of right shoulder 05/28/2017   Chest pain at rest    Hyperglycemia    NSTEMI (non-ST elevated myocardial infarction)    Diabetes mellitus without complication 04/28/2014   Essential hypertension 04/28/2014   HLD (hyperlipidemia) 04/28/2014   Chest pain 04/28/2014   Pain in the chest    Fall 12/22/2013   Concussion 12/22/2013   Lumbar transverse process fracture 12/22/2013   Complicated migraine 12/22/2013   DM (diabetes mellitus) (HCC) 12/22/2013   Fracture of multiple ribs of right side 12/19/2013   GERD (gastroesophageal reflux disease) 08/03/2011   Gastroparesis 08/03/2011   Anxiety 08/03/2011   Rectocele 05/12/2011    Enterocele 05/12/2011   COLONIC POLYPS, ADENOMATOUS, HX OF 05/02/2007   Past Medical History:  Diagnosis Date   Anxiety    Arthritis    hands, all over, back   Atherosclerosis 2017   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 Meisinger's nurse states that pt is type 2 diabetic, controlled by diet.   Diverticulosis, sigmoid    Duodenitis without mention of hemorrhage    Enterocele    History of   Esophagitis, unspecified    Fibromyalgia    Gastroparesis due to DM    GERD (gastroesophageal reflux disease)    Glaucoma    History of concussion    Hx of colonic polyps    Hyperlipidemia    Hypertension    L4-L5 disc bulge 03/2003   Migraine    Myocardial infarction    NSTEMI, patient states no one told her of this   Osteopenia    Pain in the chest    Parathyroid adenoma 02/11/2018   PONV (postoperative nausea and vomiting)    Rectocele    History of   Right rib fracture 12/20/2013   Seasonal allergies    Shortness of breath    quit smoking 2013   Skin cancer    Nose   Stricture and stenosis of esophagus    Thyroid nodule    Bilateral   Tobacco dependence    Uterine cancer 1989   Vertigo    Vitamin D deficiency     Family History  Problem Relation Age of Onset   Colon cancer Father 45   Stomach cancer Father    Heart disease Mother        MI, CVA   Rectal cancer Neg Hx    Esophageal cancer Neg Hx     Past Surgical History:  Procedure Laterality Date   APPENDECTOMY     BLADDER SURGERY     Tack    CHOLECYSTECTOMY     COLONOSCOPY  last one 11/09/2017   multiple   DILATION AND EVACUATION     ESOPHAGOGASTRODUODENOSCOPY  last one 12/06/2004   multiple   LEEP     PARATHYROIDECTOMY Right 03/15/2018   Procedure: RIGHT INFERIOR PARATHYROIDECTOMY;  Surgeon: Darnell Level, MD;  Location: [facility]  ORS;  Service: General;  Laterality: Right;   reclast     2019   RECTOCELE REPAIR  05/11/2011   Procedure: POSTERIOR REPAIR (RECTOCELE);  Surgeon: Lavina Hamman, MD;  Location: [facility] ORS;  Service: Gynecology;  Laterality: N/A;   TONSILLECTOMY     TUBAL LIGATION  VAGINAL HYSTERECTOMY  12/1987   BSO   Social History   Occupational History   Occupation: Retired    Comment: Chief Financial Officer   Tobacco Use   Smoking status: Former    Packs/day: 1.00    Years: 40.00    Additional pack years: 0.00    Total pack years: 40.00    Types: Cigarettes    Quit date: 02/22/2011    Years since quitting: 11.2   Smokeless tobacco: Never  Vaping Use   Vaping Use: Never used  Substance and Sexual Activity   Alcohol use: No   Drug use: No   Sexual activity: Not on file

## 2022-07-07 ENCOUNTER — Telehealth (HOSPITAL_COMMUNITY): Payer: Self-pay | Admitting: *Deleted

## 2022-07-07 NOTE — Telephone Encounter (Signed)
Reaching out to patient to offer assistance regarding upcoming cardiac imaging study; pt verbalizes understanding of appt date/time, parking situation and where to check in, pre-test NPO status  and verified current allergies; name and call back number provided for further questions should they arise    RN Navigator Cardiac Imaging [facility] [phone] office [phone] cell  Patient aware to avoid caffeine 12 hours prior to her cardiac PET scan. 

## 2022-07-11 ENCOUNTER — Ambulatory Visit (HOSPITAL_COMMUNITY)
Admission: RE | Admit: 2022-07-11 | Discharge: 2022-07-11 | Disposition: A | Discharge status: Home / Self Care | Payer: Medicare HMO | Source: Ambulatory Visit | Attending: Cardiology | Admitting: Cardiology

## 2022-07-11 ENCOUNTER — Encounter: Payer: Self-pay | Admitting: Cardiology

## 2022-07-11 DIAGNOSIS — R0789 Other chest pain: Secondary | ICD-10-CM | POA: Diagnosis not present

## 2022-07-11 LAB — NM PET CT CARDIAC PERFUSION MULTI W/ABSOLUTE BLOODFLOW
LV dias vol: 82 mL (ref 46–106)
LV sys vol: 36 mL
MBFR: 2.48
Nuc Rest EF: 56 %
Nuc Stress EF: 65 %
Rest MBF: 1.01 ml/g/min
Rest Nuclear Isotope Dose: 19.5 mCi
ST Depression (mm): 0 mm
Stress MBF: 2.5 ml/g/min
Stress Nuclear Isotope Dose: 19.4 mCi

## 2022-07-11 MED ORDER — DEXTROSE 5 % IV SOLN
INTRAVENOUS | Status: AC
  Filled 2022-07-11: qty 50

## 2022-07-11 MED ORDER — REGADENOSON 0.4 MG/5ML IV SOLN
INTRAVENOUS | Status: AC
  Filled 2022-07-11: qty 5

## 2022-07-11 MED ORDER — RUBIDIUM RB82 GENERATOR (RUBYFILL)
19.4900 | PACK | Freq: Once | INTRAVENOUS | Status: AC
  Administered 2022-07-11: 19.49 via INTRAVENOUS

## 2022-07-11 MED ORDER — REGADENOSON 0.4 MG/5ML IV SOLN
0.4000 mg | Freq: Once | INTRAVENOUS | Status: AC
  Administered 2022-07-11: 0.4 mg via INTRAVENOUS

## 2022-07-11 MED ORDER — RUBIDIUM RB82 GENERATOR (RUBYFILL)
19.4400 | PACK | Freq: Once | INTRAVENOUS | Status: AC
  Administered 2022-07-11: 19.44 via INTRAVENOUS

## 2022-07-11 MED ORDER — CAFFEINE CITRATE BASE COMPONENT 10 MG/ML IV SOLN
INTRAVENOUS | Status: AC
  Filled 2022-07-11: qty 3

## 2022-07-12 ENCOUNTER — Telehealth: Payer: Self-pay

## 2022-07-12 NOTE — Telephone Encounter (Signed)
-----   Message from Quintella Reichert, MD sent at 07/11/2022 11:52 AM EDT ----- Normal stress test with no ischemia.  She does have coronary artery calcifications.

## 2022-07-12 NOTE — Telephone Encounter (Signed)
Discussed w/ patient normal stress test results. Patient verbalizes understanding that coronary artery calcifications are present but no blockages.

## 2022-07-20 DIAGNOSIS — H353211 Exudative age-related macular degeneration, right eye, with active choroidal neovascularization: Secondary | ICD-10-CM | POA: Diagnosis not present

## 2022-07-20 DIAGNOSIS — H353121 Nonexudative age-related macular degeneration, left eye, early dry stage: Secondary | ICD-10-CM | POA: Diagnosis not present

## 2022-07-20 DIAGNOSIS — H43813 Vitreous degeneration, bilateral: Secondary | ICD-10-CM | POA: Diagnosis not present

## 2022-07-31 DIAGNOSIS — E119 Type 2 diabetes mellitus without complications: Secondary | ICD-10-CM | POA: Diagnosis not present

## 2022-07-31 DIAGNOSIS — J449 Chronic obstructive pulmonary disease, unspecified: Secondary | ICD-10-CM | POA: Diagnosis not present

## 2022-07-31 DIAGNOSIS — R059 Cough, unspecified: Secondary | ICD-10-CM | POA: Diagnosis not present

## 2022-07-31 DIAGNOSIS — R509 Fever, unspecified: Secondary | ICD-10-CM | POA: Diagnosis not present

## 2022-07-31 DIAGNOSIS — R0602 Shortness of breath: Secondary | ICD-10-CM | POA: Diagnosis not present

## 2022-09-07 DIAGNOSIS — N183 Chronic kidney disease, stage 3 unspecified: Secondary | ICD-10-CM | POA: Diagnosis not present

## 2022-09-07 DIAGNOSIS — E7800 Pure hypercholesterolemia, unspecified: Secondary | ICD-10-CM | POA: Diagnosis not present

## 2022-09-07 DIAGNOSIS — E039 Hypothyroidism, unspecified: Secondary | ICD-10-CM | POA: Diagnosis not present

## 2022-09-07 DIAGNOSIS — E210 Primary hyperparathyroidism: Secondary | ICD-10-CM | POA: Diagnosis not present

## 2022-09-07 DIAGNOSIS — Z794 Long term (current) use of insulin: Secondary | ICD-10-CM | POA: Diagnosis not present

## 2022-09-07 DIAGNOSIS — E1122 Type 2 diabetes mellitus with diabetic chronic kidney disease: Secondary | ICD-10-CM | POA: Diagnosis not present

## 2022-09-07 DIAGNOSIS — I10 Essential (primary) hypertension: Secondary | ICD-10-CM | POA: Diagnosis not present

## 2022-09-28 DIAGNOSIS — H43813 Vitreous degeneration, bilateral: Secondary | ICD-10-CM | POA: Diagnosis not present

## 2022-09-28 DIAGNOSIS — H353121 Nonexudative age-related macular degeneration, left eye, early dry stage: Secondary | ICD-10-CM | POA: Diagnosis not present

## 2022-09-28 DIAGNOSIS — H353211 Exudative age-related macular degeneration, right eye, with active choroidal neovascularization: Secondary | ICD-10-CM | POA: Diagnosis not present

## 2022-10-24 DIAGNOSIS — E041 Nontoxic single thyroid nodule: Secondary | ICD-10-CM | POA: Diagnosis not present

## 2022-10-24 DIAGNOSIS — E039 Hypothyroidism, unspecified: Secondary | ICD-10-CM | POA: Diagnosis not present

## 2022-10-24 DIAGNOSIS — I10 Essential (primary) hypertension: Secondary | ICD-10-CM | POA: Diagnosis not present

## 2022-10-24 DIAGNOSIS — Z794 Long term (current) use of insulin: Secondary | ICD-10-CM | POA: Diagnosis not present

## 2022-10-24 DIAGNOSIS — Z8639 Personal history of other endocrine, nutritional and metabolic disease: Secondary | ICD-10-CM | POA: Diagnosis not present

## 2022-10-24 DIAGNOSIS — E063 Autoimmune thyroiditis: Secondary | ICD-10-CM | POA: Diagnosis not present

## 2022-10-24 DIAGNOSIS — M810 Age-related osteoporosis without current pathological fracture: Secondary | ICD-10-CM | POA: Diagnosis not present

## 2022-10-24 DIAGNOSIS — E119 Type 2 diabetes mellitus without complications: Secondary | ICD-10-CM | POA: Diagnosis not present

## 2022-10-26 DIAGNOSIS — Z1231 Encounter for screening mammogram for malignant neoplasm of breast: Secondary | ICD-10-CM | POA: Diagnosis not present

## 2022-11-30 DIAGNOSIS — H43813 Vitreous degeneration, bilateral: Secondary | ICD-10-CM | POA: Diagnosis not present

## 2022-11-30 DIAGNOSIS — H353211 Exudative age-related macular degeneration, right eye, with active choroidal neovascularization: Secondary | ICD-10-CM | POA: Diagnosis not present

## 2022-11-30 DIAGNOSIS — H353121 Nonexudative age-related macular degeneration, left eye, early dry stage: Secondary | ICD-10-CM | POA: Diagnosis not present

## 2023-02-02 DIAGNOSIS — H353121 Nonexudative age-related macular degeneration, left eye, early dry stage: Secondary | ICD-10-CM | POA: Diagnosis not present

## 2023-02-02 DIAGNOSIS — H43813 Vitreous degeneration, bilateral: Secondary | ICD-10-CM | POA: Diagnosis not present

## 2023-02-02 DIAGNOSIS — H353211 Exudative age-related macular degeneration, right eye, with active choroidal neovascularization: Secondary | ICD-10-CM | POA: Diagnosis not present

## 2023-02-10 ENCOUNTER — Encounter (HOSPITAL_BASED_OUTPATIENT_CLINIC_OR_DEPARTMENT_OTHER): Payer: Self-pay | Admitting: Urology

## 2023-02-10 ENCOUNTER — Emergency Department (HOSPITAL_BASED_OUTPATIENT_CLINIC_OR_DEPARTMENT_OTHER)
Admission: EM | Admit: 2023-02-10 | Discharge: 2023-02-10 | Disposition: A | Discharge status: Home / Self Care | Payer: Medicare HMO | Attending: Emergency Medicine | Admitting: Emergency Medicine

## 2023-02-10 ENCOUNTER — Emergency Department (HOSPITAL_BASED_OUTPATIENT_CLINIC_OR_DEPARTMENT_OTHER): Payer: Medicare HMO

## 2023-02-10 DIAGNOSIS — Z79899 Other long term (current) drug therapy: Secondary | ICD-10-CM | POA: Insufficient documentation

## 2023-02-10 DIAGNOSIS — Z7982 Long term (current) use of aspirin: Secondary | ICD-10-CM | POA: Insufficient documentation

## 2023-02-10 DIAGNOSIS — I10 Essential (primary) hypertension: Secondary | ICD-10-CM | POA: Insufficient documentation

## 2023-02-10 DIAGNOSIS — J069 Acute upper respiratory infection, unspecified: Secondary | ICD-10-CM | POA: Diagnosis not present

## 2023-02-10 DIAGNOSIS — Z20822 Contact with and (suspected) exposure to covid-19: Secondary | ICD-10-CM | POA: Insufficient documentation

## 2023-02-10 DIAGNOSIS — I2510 Atherosclerotic heart disease of native coronary artery without angina pectoris: Secondary | ICD-10-CM | POA: Diagnosis not present

## 2023-02-10 DIAGNOSIS — Z794 Long term (current) use of insulin: Secondary | ICD-10-CM | POA: Diagnosis not present

## 2023-02-10 DIAGNOSIS — J441 Chronic obstructive pulmonary disease with (acute) exacerbation: Secondary | ICD-10-CM | POA: Insufficient documentation

## 2023-02-10 DIAGNOSIS — E119 Type 2 diabetes mellitus without complications: Secondary | ICD-10-CM | POA: Diagnosis not present

## 2023-02-10 DIAGNOSIS — R0602 Shortness of breath: Secondary | ICD-10-CM | POA: Diagnosis not present

## 2023-02-10 DIAGNOSIS — J9811 Atelectasis: Secondary | ICD-10-CM | POA: Diagnosis not present

## 2023-02-10 LAB — RESP PANEL BY RT-PCR (RSV, FLU A&B, COVID)  RVPGX2
Influenza A by PCR: NEGATIVE
Influenza B by PCR: NEGATIVE
Resp Syncytial Virus by PCR: NEGATIVE
SARS Coronavirus 2 by RT PCR: NEGATIVE

## 2023-02-10 MED ORDER — ALBUTEROL SULFATE HFA 108 (90 BASE) MCG/ACT IN AERS: 2.0000 | INHALATION_SPRAY | RESPIRATORY_TRACT | Status: DC | PRN

## 2023-02-10 MED ORDER — IPRATROPIUM-ALBUTEROL 0.5-2.5 (3) MG/3ML IN SOLN
RESPIRATORY_TRACT | Status: AC
  Administered 2023-02-10: 3 mL via RESPIRATORY_TRACT
  Filled 2023-02-10: qty 3

## 2023-02-10 MED ORDER — ALBUTEROL SULFATE HFA 108 (90 BASE) MCG/ACT IN AERS: 1.0000 | INHALATION_SPRAY | Freq: Four times a day (QID) | RESPIRATORY_TRACT | 0 refills | Status: AC | PRN

## 2023-02-10 MED ORDER — IPRATROPIUM-ALBUTEROL 0.5-2.5 (3) MG/3ML IN SOLN
3.0000 mL | RESPIRATORY_TRACT | Status: AC
  Administered 2023-02-10 (×2): 3 mL via RESPIRATORY_TRACT
  Filled 2023-02-10: qty 6

## 2023-02-10 MED ORDER — AMOXICILLIN-POT CLAVULANATE 875-125 MG PO TABS: 1.0000 | ORAL_TABLET | Freq: Two times a day (BID) | ORAL | 0 refills | Status: AC

## 2023-02-10 MED ORDER — METHYLPREDNISOLONE 4 MG PO TBPK: ORAL_TABLET | ORAL | 0 refills | Status: AC

## 2023-02-10 MED ORDER — IPRATROPIUM-ALBUTEROL 0.5-2.5 (3) MG/3ML IN SOLN: 3.0000 mL | Freq: Once | RESPIRATORY_TRACT | Status: AC

## 2023-02-10 MED ORDER — BENZONATATE 100 MG PO CAPS: 100.0000 mg | ORAL_CAPSULE | Freq: Three times a day (TID) | ORAL | 0 refills | Status: AC

## 2023-02-10 NOTE — ED Notes (Signed)
 RN reviewed discharge instructions with pt. Pt verbalized understanding and had no further questions. VSS upon discharge.

## 2023-02-10 NOTE — Discharge Instructions (Signed)
 As we discussed, your workup in the ER today was reassuring for acute findings.  You are negative for COVID, flu, and RSV.  Your chest x-ray also did not show any signs of pneumonia.  However given your history of lung and heart disease we have gone ahead and treated you for pneumonia with Augmentin .  Please fill and take this prescription as prescribed in its entirety.  I have also given you an albuterol  inhaler and a Medrol  Dosepak for your COPD.  I have given you Tessalon  as well for cough.  I recommend that you get plenty of rest and focus on symptomatic relief which includes Cepacol throat lozenges for sore throat, Mucinex  for congestion, and tylenol /ibuprofen as needed for fevers and bodyaches.  I also recommend:  Increased fluid intake. Sports drinks offer valuable electrolytes, sugars, and fluids.  Breathing heated mist or steam (vaporizer or shower).  Eating chicken soup or other clear broths, and maintaining good nutrition.   Increasing usage of your inhaler if you have asthma.  Return to work when your temperature has returned to normal.  Gargle warm salt water and spit it out for sore throat. Take benadryl  or Zyrtec to decrease sinus secretions.  Follow Up: Follow up with your primary care doctor in 5-7 days for recheck of ongoing symptoms.  Return to emergency department for emergent changing or worsening of symptoms.

## 2023-02-10 NOTE — ED Triage Notes (Signed)
 Pt states was at PCP today for cough and SOB x 1 week  Sent here for Neb treatment, states SOB and cough with green phlegm    H/o COPD

## 2023-02-10 NOTE — ED Notes (Signed)
 Ms. Knipp O2 sat was 96% on RA while ambulating.

## 2023-02-10 NOTE — ED Provider Notes (Signed)
 [place] EMERGENCY DEPARTMENT AT [facility] Provider Note   CSN: [id number] Arrival date & time: 02/10/23  1354     History  Chief Complaint  Patient presents with   Shortness of Breath    Valerie Wood is a 78 y.o. female.  Patient with history of diabetes, COPD, NSTEMI, hypertension, hyperlipidemia, CAD presents today with complaints of cough and congestion. She states that she went to the [facility] walk-in clinic earlier today and was told to come here for a breathing treatment, chest x-ray, and antibiotics. She presents today for same. She states that she has been coughing and congested for 1 week. States her cough is productive of green sputum. Does state that she has COPD, however she does not use inhalers daily. States that her inhalers expired before she used them and that she threw them away and does not have anymore.  She denies ever having a COPD exacerbation before.  Denies fevers or chills.  Does note some shortness of breath and tightness in her chest.  No nausea or vomiting. Does have several sick family members.  The history is provided by the patient. No language interpreter was used.  Shortness of Breath Associated symptoms: chest pain, cough and wheezing        Home Medications Prior to Admission medications   Medication Sig Start Date End Date Taking? Authorizing Provider  Accu-Chek FastClix Lancets MISC  08/19/18   [provider]  ACCU-CHEK SMARTVIEW test strip  01/03/19   [provider]  aspirin  EC 81 MG tablet Take 81 mg by mouth daily.    [provider]  atorvastatin  (LIPITOR) 40 MG tablet Take 40 mg by mouth at bedtime.     [provider]  BD VEO INSULIN  SYRINGE U/F 31G X 15/64 0.5 ML MISC  08/28/18   [provider]  Biotin 5 MG TABS Take 5 mg by mouth at bedtime.     [provider]  celecoxib  (CELEBREX ) 200 MG capsule Take 1 capsule (200 mg total) by mouth 2 (two) times daily. Patient  taking differently: Take 200 mg by mouth at bedtime. 10/03/17   Jule Ronal CROME, PA-C  cephALEXin (KEFLEX) 250 MG capsule Take 250 mg by mouth daily.    [provider]  cetirizine (ZYRTEC) 10 MG tablet Take 10 mg by mouth at bedtime.     [provider]  cholecalciferol  (VITAMIN D) 1000 UNITS tablet Take 1,000 Units by mouth at bedtime.     [provider]  diclofenac  Sodium (VOLTAREN ) 1 % GEL Apply 4 g topically 4 (four) times daily. 04/03/22   Emil Share, DO  insulin  NPH-regular Human (NOVOLIN 70/30) (70-30) 100 UNIT/ML injection Inject 30-40 Units into the skin 2 (two) times daily. Inject 40 units subcutaneously in the morning and 50 units at supper    [provider]  levothyroxine (SYNTHROID) 75 MCG tablet Take 75 mcg by mouth every morning. 04/07/22   [provider]  losartan  (COZAAR ) 50 MG tablet Take 50 mg by mouth at bedtime.    [provider]  Multiple Vitamin (MULTIVITAMIN WITH MINERALS) TABS tablet Take 1 tablet by mouth at bedtime.     [provider]  omeprazole (PRILOSEC) 20 MG capsule Take 20 mg by mouth at bedtime.     [provider]  ondansetron  (ZOFRAN -ODT) 4 MG disintegrating tablet 4mg  ODT q4 hours prn nausea/vomit 04/03/22   Emil Share, DO  RELION PEN NEEDLES 32G X 4 MM MISC  12/19/18  [provider]  RESTASIS 0.05 % ophthalmic emulsion Place 1 drop into both eyes 2 (two) times daily. 03/22/22   [provider]  traZODone (DESYREL) 50 MG tablet Take 50 mg by mouth at bedtime.    [provider]      Allergies    Canagliflozin , Macrolides and ketolides, Morphine  and codeine , Tetracyclines & related, Tramadol , Dilaudid  [hydromorphone  hcl], Doxycycline hyclate, Gabapentin, Glipizide, Iloperidone, Januvia [sitagliptin], Macrobid [nitrofurantoin macrocrystal], Metformin, Nitrofurantoin, Pantoprazole , Symbicort  [budesonide -formoterol  fumarate], Victoza  [liraglutide ], and Ciprofloxacin  hcl    Review of Systems   Review of Systems  HENT:  Positive for congestion.   Respiratory:  Positive for cough, shortness of breath and wheezing.   Cardiovascular:  Positive for chest pain.  All other systems reviewed and are negative.   Physical Exam Updated Vital Signs BP (!) 140/111 Comment: pt. states that she has some anxiety, causes bp to rise  Pulse 87   Temp 98.3 F (36.8 C) (Oral)   Resp 13   Ht 5' 3 (1.6 m)   Wt 75.4 kg   SpO2 93%   BMI 29.45 kg/m  Physical Exam Vitals and nursing note reviewed.  Constitutional:      General: She is not in acute distress.    Appearance: Normal appearance. She is normal weight. She is not ill-appearing, toxic-appearing or diaphoretic.  HENT:     Head: Normocephalic and atraumatic.  Cardiovascular:     Rate and Rhythm: Normal rate and regular rhythm.     Heart sounds: Normal heart sounds.  Pulmonary:     Effort: Pulmonary effort is normal. No respiratory distress.     Breath sounds: Wheezing present.  Abdominal:     Palpations: Abdomen is soft.     Tenderness: There is no abdominal tenderness.  Musculoskeletal:        General: Normal range of motion.     Cervical back: Normal range of motion.     Right lower leg: No tenderness. No edema.     Left lower leg: No tenderness. No edema.  Skin:    General: Skin is warm and dry.  Neurological:     General: No focal deficit present.     Mental Status: She is alert.  Psychiatric:        Mood and Affect: Mood normal.        Behavior: Behavior normal.     ED Results / Procedures / Treatments   Labs (all labs ordered are listed, but only abnormal results are displayed) Labs Reviewed  RESP PANEL BY RT-PCR (RSV, FLU A&B, COVID)  [id number]    EKG None  Radiology DG Chest Port 1 View Result Date: 02/10/2023 CLINICAL DATA:  Shortness of breath for 1 week EXAM: PORTABLE CHEST 1 VIEW COMPARISON:  Chest radiograph dated FINDINGS: The heart size and mediastinal contours are within  normal limits. Vascular calcifications are seen in the aortic arch. There is minimal right basilar atelectasis. The left lung is clear. No pleural effusion or pneumothorax. The visualized skeletal structures are unremarkable. IMPRESSION: Minimal right basilar atelectasis. Electronically Signed   By: Norman Hopper M.D.   On: 02/10/2023 14:33    Procedures Procedures    Medications Ordered in ED Medications  ipratropium-albuterol  (DUONEB) 0.5-2.5 (3) MG/3ML nebulizer solution 3 mL (3 mLs Nebulization Given 02/10/23 1415)  ipratropium-albuterol  (DUONEB) 0.5-2.5 (3) MG/3ML nebulizer solution 3 mL (3 mLs Nebulization Given 02/10/23 1459)    ED Course/ Medical Decision Making/ A&P  Medical Decision Making Amount and/or Complexity of Data Reviewed Radiology: ordered.  Risk Prescription drug management.   Patient presents today with complaints of cough and congestion x 7 days.  They are afebrile, nontoxic-appearing, and in no acute distress with reassuring vital signs.  Physical exam reveals wheezing in the bilateral lung bases. CXR ordered and obtained and has resulted and reveals  Minimal right basilar atelectasis  I have personally reviewed and interpreted this imaging and agree with radiology interpretation.  Patient negative for COVID, flu, and RSV. Given patient's comorbid risk factors, will treat empirically for infection with augmentin .  Will also prescribe albuterol , medrol  dosepak, and Tessalon .  Consider further evaluation with labs, however after 3 breathing treatments patient states she is feeling better.  She is able to walk without any drop in her oxygen saturation.  She would prefer to go home.  Evaluation and diagnostic testing in the emergency department does not suggest an emergent condition requiring admission or immediate intervention beyond what has been performed at this time.  Plan for discharge with close PCP follow-up.  Patient is  understanding and amenable with plan, educated on red flag symptoms that would prompt immediate return.  Patient discharged in stable condition.  Final Clinical Impression(s) / ED Diagnoses Final diagnoses:  Upper respiratory tract infection, unspecified type  COPD exacerbation (HCC)    Rx / DC Orders ED Discharge Orders          Ordered    albuterol  (VENTOLIN  HFA) 108 (90 Base) MCG/ACT inhaler  Every 6 hours PRN        02/10/23 1739    methylPREDNISolone  (MEDROL  DOSEPAK) 4 MG TBPK tablet        02/10/23 1739    amoxicillin -clavulanate (AUGMENTIN ) 875-125 MG tablet  Every 12 hours        02/10/23 1739    benzonatate  (TESSALON ) 100 MG capsule  Every 8 hours        02/10/23 1739          An After Visit Summary was printed and given to the patient.     Valerie Wood 02/10/23 1741    Lenor Hollering, MD 02/11/23 [phone]

## 2023-02-11 DIAGNOSIS — J069 Acute upper respiratory infection, unspecified: Secondary | ICD-10-CM | POA: Diagnosis not present

## 2023-02-16 DIAGNOSIS — J441 Chronic obstructive pulmonary disease with (acute) exacerbation: Secondary | ICD-10-CM | POA: Diagnosis not present

## 2023-02-16 DIAGNOSIS — J988 Other specified respiratory disorders: Secondary | ICD-10-CM | POA: Diagnosis not present

## 2023-03-15 DIAGNOSIS — J449 Chronic obstructive pulmonary disease, unspecified: Secondary | ICD-10-CM | POA: Diagnosis not present

## 2023-03-15 DIAGNOSIS — M797 Fibromyalgia: Secondary | ICD-10-CM | POA: Diagnosis not present

## 2023-03-15 DIAGNOSIS — E039 Hypothyroidism, unspecified: Secondary | ICD-10-CM | POA: Diagnosis not present

## 2023-03-15 DIAGNOSIS — F411 Generalized anxiety disorder: Secondary | ICD-10-CM | POA: Diagnosis not present

## 2023-03-15 DIAGNOSIS — N183 Chronic kidney disease, stage 3 unspecified: Secondary | ICD-10-CM | POA: Diagnosis not present

## 2023-03-15 DIAGNOSIS — Z0000 Encounter for general adult medical examination without abnormal findings: Secondary | ICD-10-CM | POA: Diagnosis not present

## 2023-03-15 DIAGNOSIS — G4700 Insomnia, unspecified: Secondary | ICD-10-CM | POA: Diagnosis not present

## 2023-03-15 DIAGNOSIS — H3530 Unspecified macular degeneration: Secondary | ICD-10-CM | POA: Diagnosis not present

## 2023-03-15 DIAGNOSIS — E1122 Type 2 diabetes mellitus with diabetic chronic kidney disease: Secondary | ICD-10-CM | POA: Diagnosis not present

## 2023-03-15 DIAGNOSIS — E7800 Pure hypercholesterolemia, unspecified: Secondary | ICD-10-CM | POA: Diagnosis not present

## 2023-04-13 DIAGNOSIS — H43813 Vitreous degeneration, bilateral: Secondary | ICD-10-CM | POA: Diagnosis not present

## 2023-04-13 DIAGNOSIS — H353211 Exudative age-related macular degeneration, right eye, with active choroidal neovascularization: Secondary | ICD-10-CM | POA: Diagnosis not present

## 2023-04-13 DIAGNOSIS — H353121 Nonexudative age-related macular degeneration, left eye, early dry stage: Secondary | ICD-10-CM | POA: Diagnosis not present

## 2023-04-25 DIAGNOSIS — M810 Age-related osteoporosis without current pathological fracture: Secondary | ICD-10-CM | POA: Diagnosis not present

## 2023-04-25 DIAGNOSIS — E119 Type 2 diabetes mellitus without complications: Secondary | ICD-10-CM | POA: Diagnosis not present

## 2023-04-25 DIAGNOSIS — Z794 Long term (current) use of insulin: Secondary | ICD-10-CM | POA: Diagnosis not present

## 2023-04-25 DIAGNOSIS — I10 Essential (primary) hypertension: Secondary | ICD-10-CM | POA: Diagnosis not present

## 2023-04-25 DIAGNOSIS — E041 Nontoxic single thyroid nodule: Secondary | ICD-10-CM | POA: Diagnosis not present

## 2023-04-25 DIAGNOSIS — Z8639 Personal history of other endocrine, nutritional and metabolic disease: Secondary | ICD-10-CM | POA: Diagnosis not present

## 2023-06-28 DIAGNOSIS — H353121 Nonexudative age-related macular degeneration, left eye, early dry stage: Secondary | ICD-10-CM | POA: Diagnosis not present

## 2023-06-28 DIAGNOSIS — H43813 Vitreous degeneration, bilateral: Secondary | ICD-10-CM | POA: Diagnosis not present

## 2023-06-28 DIAGNOSIS — H353211 Exudative age-related macular degeneration, right eye, with active choroidal neovascularization: Secondary | ICD-10-CM | POA: Diagnosis not present

## 2023-07-10 DIAGNOSIS — M6530 Trigger finger, unspecified finger: Secondary | ICD-10-CM | POA: Diagnosis not present

## 2023-08-30 DIAGNOSIS — H353211 Exudative age-related macular degeneration, right eye, with active choroidal neovascularization: Secondary | ICD-10-CM | POA: Diagnosis not present

## 2023-09-09 ENCOUNTER — Encounter (HOSPITAL_COMMUNITY): Payer: Self-pay

## 2023-09-09 ENCOUNTER — Emergency Department (HOSPITAL_COMMUNITY)
Admission: EM | Admit: 2023-09-09 | Discharge: 2023-09-09 | Disposition: A | Discharge status: Home / Self Care | Attending: Student in an Organized Health Care Education/Training Program | Admitting: Student in an Organized Health Care Education/Training Program

## 2023-09-09 ENCOUNTER — Other Ambulatory Visit: Payer: Self-pay

## 2023-09-09 ENCOUNTER — Emergency Department (HOSPITAL_COMMUNITY)

## 2023-09-09 DIAGNOSIS — Z7951 Long term (current) use of inhaled steroids: Secondary | ICD-10-CM | POA: Diagnosis not present

## 2023-09-09 DIAGNOSIS — Z794 Long term (current) use of insulin: Secondary | ICD-10-CM | POA: Diagnosis not present

## 2023-09-09 DIAGNOSIS — E119 Type 2 diabetes mellitus without complications: Secondary | ICD-10-CM | POA: Diagnosis not present

## 2023-09-09 DIAGNOSIS — Z7982 Long term (current) use of aspirin: Secondary | ICD-10-CM | POA: Diagnosis not present

## 2023-09-09 DIAGNOSIS — R0789 Other chest pain: Secondary | ICD-10-CM | POA: Diagnosis not present

## 2023-09-09 DIAGNOSIS — Z79899 Other long term (current) drug therapy: Secondary | ICD-10-CM | POA: Insufficient documentation

## 2023-09-09 DIAGNOSIS — S2241XA Multiple fractures of ribs, right side, initial encounter for closed fracture: Secondary | ICD-10-CM | POA: Diagnosis not present

## 2023-09-09 DIAGNOSIS — I517 Cardiomegaly: Secondary | ICD-10-CM | POA: Diagnosis not present

## 2023-09-09 DIAGNOSIS — Z720 Tobacco use: Secondary | ICD-10-CM | POA: Insufficient documentation

## 2023-09-09 DIAGNOSIS — I2510 Atherosclerotic heart disease of native coronary artery without angina pectoris: Secondary | ICD-10-CM | POA: Insufficient documentation

## 2023-09-09 DIAGNOSIS — R079 Chest pain, unspecified: Secondary | ICD-10-CM

## 2023-09-09 DIAGNOSIS — R0602 Shortness of breath: Secondary | ICD-10-CM | POA: Diagnosis present

## 2023-09-09 DIAGNOSIS — I10 Essential (primary) hypertension: Secondary | ICD-10-CM | POA: Diagnosis not present

## 2023-09-09 DIAGNOSIS — I700 Atherosclerosis of aorta: Secondary | ICD-10-CM | POA: Diagnosis not present

## 2023-09-09 DIAGNOSIS — R110 Nausea: Secondary | ICD-10-CM | POA: Diagnosis not present

## 2023-09-09 DIAGNOSIS — J449 Chronic obstructive pulmonary disease, unspecified: Secondary | ICD-10-CM | POA: Diagnosis not present

## 2023-09-09 LAB — CBC WITH DIFFERENTIAL/PLATELET
Abs Immature Granulocytes: 0.01 K/uL (ref 0.00–0.07)
Basophils Absolute: 0 K/uL (ref 0.0–0.1)
Basophils Relative: 0 %
Eosinophils Absolute: 0.2 K/uL (ref 0.0–0.5)
Eosinophils Relative: 2 %
HCT: 40.6 % (ref 36.0–46.0)
Hemoglobin: 13.7 g/dL (ref 12.0–15.0)
Immature Granulocytes: 0 %
Lymphocytes Relative: 21 %
Lymphs Abs: 1.6 K/uL (ref 0.7–4.0)
MCH: 30.6 pg (ref 26.0–34.0)
MCHC: 33.7 g/dL (ref 30.0–36.0)
MCV: 90.6 fL (ref 80.0–100.0)
Monocytes Absolute: 0.6 K/uL (ref 0.1–1.0)
Monocytes Relative: 7 %
Neutro Abs: 5.3 K/uL (ref 1.7–7.7)
Neutrophils Relative %: 70 %
Platelets: 201 K/uL (ref 150–400)
RBC: 4.48 MIL/uL (ref 3.87–5.11)
RDW: 13.3 % (ref 11.5–15.5)
WBC: 7.7 K/uL (ref 4.0–10.5)
nRBC: 0 % (ref 0.0–0.2)

## 2023-09-09 LAB — COMPREHENSIVE METABOLIC PANEL WITH GFR
ALT: 21 U/L (ref 0–44)
AST: 25 U/L (ref 15–41)
Albumin: 3.8 g/dL (ref 3.5–5.0)
Alkaline Phosphatase: 65 U/L (ref 38–126)
Anion gap: 6 (ref 5–15)
BUN: 16 mg/dL (ref 8–23)
CO2: 25 mmol/L (ref 22–32)
Calcium: 10.1 mg/dL (ref 8.9–10.3)
Chloride: 105 mmol/L (ref 98–111)
Creatinine, Ser: 1.09 mg/dL — ABNORMAL HIGH (ref 0.44–1.00)
GFR, Estimated: 52 mL/min — ABNORMAL LOW (ref >60)
Glucose, Bld: 191 mg/dL — ABNORMAL HIGH (ref 70–99)
Potassium: 4.7 mmol/L (ref 3.5–5.1)
Sodium: 136 mmol/L (ref 135–145)
Total Bilirubin: 0.6 mg/dL (ref 0.0–1.2)
Total Protein: 7.2 g/dL (ref 6.5–8.1)

## 2023-09-09 LAB — BRAIN NATRIURETIC PEPTIDE: B Natriuretic Peptide: 29.9 pg/mL (ref 0.0–100.0)

## 2023-09-09 LAB — TROPONIN I (HIGH SENSITIVITY)
Troponin I (High Sensitivity): 9 ng/L (ref <18)
Troponin I (High Sensitivity): 7 ng/L (ref <18)

## 2023-09-09 MED ORDER — LACTATED RINGERS IV SOLN: INTRAVENOUS | Status: DC

## 2023-09-09 MED ORDER — IPRATROPIUM-ALBUTEROL 0.5-2.5 (3) MG/3ML IN SOLN
3.0000 mL | Freq: Once | RESPIRATORY_TRACT | Status: AC
  Administered 2023-09-09: 3 mL via RESPIRATORY_TRACT
  Filled 2023-09-09: qty 3

## 2023-09-09 MED ORDER — NITROGLYCERIN 0.4 MG SL SUBL: 0.4000 mg | SUBLINGUAL_TABLET | SUBLINGUAL | Status: DC | PRN

## 2023-09-09 NOTE — ED Notes (Signed)
 Pt placed on 2 L of O2 due to OS being 88% on room air.

## 2023-09-09 NOTE — ED Provider Notes (Signed)
 [place] EMERGENCY DEPARTMENT AT [facility] Provider Note   CSN: [id number] Arrival date & time: 09/09/23  1745     Patient presents with: Chest Pain   Valerie Wood is a 78 y.o. female.   78 year old female presenting to the emergency department with chest pain that began approximately 45 minutes prior to arrival.  Patient reports a past medical history of COPD, prior tobacco use, hyperlipidemia, diabetes, hypertension, TIA, and CAD.  She regularly follows with a cardiologist.  Chart review shows a prior catheterization in 2016 that showed nonobstructive AAS CAD in the LAD and D1.  She denies using oxygen at home but reports her baseline O2 saturation is around 88%.  She has shortness of breath today but admits to chronic shortness of breath with activity.  Patient received 324 aspirin  and nitroglycerin  from EMS.  She does report slight improvement in her symptoms after the nitroglycerin .  She reports a heaviness located centrally in her chest and radiating to her back.  She denies taking any blood thinners.   Chest Pain      Prior to Admission medications   Medication Sig Start Date End Date Taking? Authorizing Provider  Accu-Chek FastClix Lancets MISC  08/19/18   [provider]  ACCU-CHEK SMARTVIEW test strip  01/03/19   [provider]  albuterol  (VENTOLIN  HFA) 108 (90 Base) MCG/ACT inhaler Inhale 1-2 puffs into the lungs every 6 (six) hours as needed for wheezing or shortness of breath. 02/10/23   Smoot, Lauraine LABOR, PA-C  amoxicillin -clavulanate (AUGMENTIN ) 875-125 MG tablet Take 1 tablet by mouth every 12 (twelve) hours. 02/10/23   Smoot, Lauraine LABOR, PA-C  aspirin  EC 81 MG tablet Take 81 mg by mouth daily.    [provider]  atorvastatin  (LIPITOR) 40 MG tablet Take 40 mg by mouth at bedtime.     [provider]  BD VEO INSULIN  SYRINGE U/F 31G X 15/64 0.5 ML MISC  08/28/18   [provider]  benzonatate  (TESSALON ) 100 MG capsule Take  1 capsule (100 mg total) by mouth every 8 (eight) hours. 02/10/23   Smoot, Lauraine LABOR, PA-C  Biotin 5 MG TABS Take 5 mg by mouth at bedtime.     [provider]  celecoxib  (CELEBREX ) 200 MG capsule Take 1 capsule (200 mg total) by mouth 2 (two) times daily. Patient taking differently: Take 200 mg by mouth at bedtime. 10/03/17   Jule Ronal CROME, PA-C  cephALEXin (KEFLEX) 250 MG capsule Take 250 mg by mouth daily.    [provider]  cetirizine (ZYRTEC) 10 MG tablet Take 10 mg by mouth at bedtime.     [provider]  cholecalciferol  (VITAMIN D) 1000 UNITS tablet Take 1,000 Units by mouth at bedtime.     [provider]  diclofenac  Sodium (VOLTAREN ) 1 % GEL Apply 4 g topically 4 (four) times daily. 04/03/22   Emil Share, DO  insulin  NPH-regular Human (NOVOLIN 70/30) (70-30) 100 UNIT/ML injection Inject 30-40 Units into the skin 2 (two) times daily. Inject 40 units subcutaneously in the morning and 50 units at supper    [provider]  levothyroxine (SYNTHROID) 75 MCG tablet Take 75 mcg by mouth every morning. 04/07/22   [provider]  losartan  (COZAAR ) 50 MG tablet Take 50 mg by mouth at bedtime.    [provider]  methylPREDNISolone  (MEDROL  DOSEPAK) 4 MG TBPK tablet Take as directed on package 02/10/23   Smoot, Lauraine LABOR, PA-C  Multiple Vitamin (MULTIVITAMIN  WITH MINERALS) TABS tablet Take 1 tablet by mouth at bedtime.     [provider]  omeprazole (PRILOSEC) 20 MG capsule Take 20 mg by mouth at bedtime.     [provider]  ondansetron  (ZOFRAN -ODT) 4 MG disintegrating tablet 4mg  ODT q4 hours prn nausea/vomit 04/03/22   Emil Share, DO  RELION PEN NEEDLES 32G X 4 MM MISC  12/19/18   [provider]  RESTASIS 0.05 % ophthalmic emulsion Place 1 drop into both eyes 2 (two) times daily. 03/22/22   [provider]  traZODone (DESYREL) 50 MG tablet Take 50 mg by mouth at bedtime.    [provider]     Allergies: Canagliflozin , Macrolides and ketolides, Morphine  and codeine , Tetracyclines & related, Tramadol , Dilaudid  [hydromorphone  hcl], Doxycycline hyclate, Gabapentin, Glipizide, Iloperidone, Januvia [sitagliptin], Macrobid [nitrofurantoin macrocrystal], Metformin, Nitrofurantoin, Pantoprazole , Symbicort  [budesonide -formoterol  fumarate], Victoza  [liraglutide ], and Ciprofloxacin hcl    Review of Systems  Cardiovascular:  Positive for chest pain.  All other systems reviewed and are negative.   Updated Vital Signs BP 127/63   Pulse 72   Temp 98.2 F (36.8 C) (Oral)   Resp 18   Ht 5' 3 (1.6 m)   Wt 73.9 kg   SpO2 95%   BMI 28.87 kg/m   Physical Exam Vitals and nursing note reviewed.  Constitutional:      General: She is not in acute distress. HENT:     Head: Normocephalic.  Eyes:     Pupils: Pupils are equal, round, and reactive to light.  Cardiovascular:     Rate and Rhythm: Normal rate.  Pulmonary:     Effort: Pulmonary effort is normal.     Breath sounds: Decreased breath sounds present.  Abdominal:     Palpations: Abdomen is soft.  Skin:    General: Skin is warm.     Capillary Refill: Capillary refill takes less than 2 seconds.  Neurological:     Mental Status: She is alert and oriented to person, place, and time.     (all labs ordered are listed, but only abnormal results are displayed) Labs Reviewed  COMPREHENSIVE METABOLIC PANEL WITH GFR - Abnormal; Notable for the following components:      Result Value   Glucose, Bld 191 (*)    Creatinine, Ser 1.09 (*)    GFR, Estimated 52 (*)    All other components within normal limits  BRAIN NATRIURETIC PEPTIDE  CBC WITH DIFFERENTIAL/PLATELET  TROPONIN I (HIGH SENSITIVITY)  TROPONIN I (HIGH SENSITIVITY)    EKG: EKG Interpretation Date/Time:  Sunday September 09 2023 18:43:25 EDT Ventricular Rate:  77 PR Interval:  196 QRS Duration:  95 QT Interval:  392 QTC Calculation: 444 R Axis:   -34  Text  Interpretation: Sinus rhythm Left ventricular hypertrophy Anterior Q waves, possibly due to LVH Confirmed by Corinthia No [id number]) on 09/09/2023 7:07:27 PM  Radiology: [facility] Chest 1 View Result Date: 09/09/2023 CLINICAL DATA:  Chest pain. EXAM: CHEST  1 VIEW COMPARISON:  02/10/2023. FINDINGS: Stable cardiomegaly. Aortic atherosclerosis. No focal consolidation, pleural effusion, or pneumothorax. Remote right-sided rib fractures. No acute osseous abnormality. IMPRESSION: 1. No acute cardiopulmonary findings. 2. Stable cardiomegaly. Electronically Signed   By: Harrietta Sherry M.D.   On: 09/09/2023 19:29     Procedures   Medications Ordered in the ED  lactated ringers  infusion ( Intravenous New Bag/Given 09/09/23 1914)  nitroGLYCERIN  (NITROSTAT ) SL tablet 0.4 mg (has no administration in time range)  ipratropium-albuterol  (DUONEB) 0.5-2.5 (3) MG/3ML nebulizer  solution 3 mL (3 mLs Nebulization Given 09/09/23 1920)    Clinical Course as of 09/09/23 2106  Sun Sep 09, 2023  1937 DG Chest 1 View reviewed [AL]    Clinical Course User Index [AL] Corinthia No, DO                                 Medical Decision Making This patient presents to the ED for concern about chest pain. This complaint involves an extensive number of treatment options. This complaint also carries with it a high risk of complications and morbidity.  The differential diagnosis includes but no limited to ACS, dissection, PE, myocarditis, esophogeal spasm, pneumonia, and others.  Co morbidities that complicate the patient evaluation      COPD, DM, HTN, hyperlipidemia, TIA, CAD  External records from outside source obtained if available and reviewed if applicable. Reviewed any available information on prior office visits, ED visits, or hospitalizations if applicable.   Cardiac Monitoring:      The patient was maintained on a cardiac monitor.   Medicines ordered and prescription drug management: I ordered medication including  nitroglycerin  for CP. I have reviewed the patients home medicines and have made adjustments as needed  Problem List / ED Course:    Chest pain: Pt presented to the ED with sudden onset chest discomfort.  She has high risk for CAD with her known prior history of CAD seen in her cath back in 2016, COPD, tobacco use, hypertension, hyperlipidemia, diabetes, and prior TIA.  She does have a recent PET scan performed. - Initial EKG nonischemic and repeat EKG did not show any significant changes. - Chest x-ray ruled out pathologies like pneumothorax or pneumonia - Lab work showed normal troponin but this will need trending - She did appear short of breath but has known severe COPD and reports an normal oxygenation around 88%.  DuoNeb treatment ordered to see if she feels any improvement in her work of breathing. - Patient had improvement in her chest discomfort after the DuoNeb.  She did not require more nitroglycerin . - Cardiology was consulted and agreed that with negative troponins and nonischemic EKGs she has a low likelihood for acute ACS.  The patient would like to follow-up outpatient with her cardiologist.  Cardiology agreed with this plan.  We will repeat her troponin and plan to discharge her home if this is within normal limits.   Amount and/or Complexity of Data Reviewed Labs: ordered. Radiology: ordered. Decision-making details documented in ED Course.  Risk Prescription drug management.     Final diagnoses:  Chest pain, unspecified type  Chronic obstructive pulmonary disease, unspecified COPD type [facility])    ED Discharge Orders     None          Syncere Eble, DO 09/09/23 2106

## 2023-09-09 NOTE — Discharge Instructions (Signed)
 Please follow-up with your cardiologist within the next few days for reevaluation.  Return to the emergency department if you develop any further chest pain or have any other concerning symptoms.

## 2023-09-09 NOTE — ED Notes (Signed)
 Phlebotomy to stick

## 2023-09-09 NOTE — ED Triage Notes (Signed)
 Pt from home for chest pain that radiates to upper back that started 45 min ago. EMS gave 324mg  of aspirin  and 2 nitroglycerin . VSS. Denies SHOB. C/O nausea, pt got 4 mg of Zofran .

## 2023-09-09 NOTE — ED Notes (Signed)
Pt placed on 3L of O2 

## 2023-09-21 DIAGNOSIS — E7800 Pure hypercholesterolemia, unspecified: Secondary | ICD-10-CM | POA: Diagnosis not present

## 2023-09-21 DIAGNOSIS — J449 Chronic obstructive pulmonary disease, unspecified: Secondary | ICD-10-CM | POA: Diagnosis not present

## 2023-09-21 DIAGNOSIS — E1122 Type 2 diabetes mellitus with diabetic chronic kidney disease: Secondary | ICD-10-CM | POA: Diagnosis not present

## 2023-09-21 DIAGNOSIS — E039 Hypothyroidism, unspecified: Secondary | ICD-10-CM | POA: Diagnosis not present

## 2023-09-21 DIAGNOSIS — N183 Chronic kidney disease, stage 3 unspecified: Secondary | ICD-10-CM | POA: Diagnosis not present

## 2023-09-21 DIAGNOSIS — I10 Essential (primary) hypertension: Secondary | ICD-10-CM | POA: Diagnosis not present

## 2023-09-25 NOTE — Progress Notes (Unsigned)
  Cardiology Office Note:  .   Date:  10/02/2023  ID:  Valerie Wood, DOB 19-Jan-1946, MRN [id number] PCP: Loreli Kins, MD  [facility] Providers Cardiologist:  Wilbert Bihari, MD { Click to update primary MD,subspecialty MD or APP then REFRESH:1}   History of Present Illness: .   Valerie Wood is a 78 y.o. female  with a history of anxiety, diabetes mellitus, fibromyalgia, gastroparesis due to diabetes mellitus, GERD, hyperlipidemia, hypertension.  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 It was felt her CP was related to significant stress.  She had a normal nuclear stress test in 2016 with no ischemia.   Patient saw Dr. Bihari 05/2022 for chest pain and PET scan no ischemia.  She went to ED 09/09/23 with chest pain and SOB with low Sats improved with Duoneb. Troponins negative EKG no change. Cardiology reviewed and felt low ACS risk and OP f/u.  Patient  comes in for f/u with her daughter. She hasn't had any further chest pain or shortness of breath. Can't exercise much with chronic dizziness.   ROS:    Studies Reviewed: SABRA         Prior CV Studies: {Select studies to display:26339}   PET CT 07/2022   LV perfusion is normal. There is no evidence of ischemia. There is no evidence of infarction.   Rest left ventricular function is normal. Rest EF: 56 %. Stress left ventricular function is normal. Stress EF: 65 %. End diastolic cavity size is normal. End systolic cavity size is normal.   Myocardial blood flow was computed to be 1.38ml/g/min at rest and 2.50ml/g/min at stress. Global myocardial blood flow reserve was 2.48 and was normal.   Coronary calcium  was present on the attenuation correction CT images. Mild coronary calcifications were present. Coronary calcifications were present in the left anterior descending artery  distribution(s).   The study is normal. The study is low risk.    Risk Assessment/Calculations:             Physical Exam:   VS:  BP 138/75   Pulse 95   Ht 5' 2 (1.575 m)   Wt 163 lb 3.2 oz (74 kg)   SpO2 91%   BMI 29.85 kg/m    Orhtostatics: No data found. Wt Readings from Last 3 Encounters:  10/02/23 163 lb 3.2 oz (74 kg)  09/09/23 163 lb (73.9 kg)  02/10/23 166 lb 3.6 oz (75.4 kg)    GEN: Well nourished, well developed in no acute distress NECK: No JVD; No carotid bruits CARDIAC:  RRR, no murmurs, rubs, gallops RESPIRATORY:  Clear to auscultation without rales, wheezing or rhonchi  ABDOMEN: Soft, non-tender, non-distended EXTREMITIES:  No edema; No deformity   ASSESSMENT AND PLAN: .    Chest pain 09/09/23 in ED with negative w/u relieved with Duoneb-no further chest pain.   CAD nonobstructive on cath 2016 in LAD and D1, normal PET CT 07/2022  HTN BP improved on recheck. No changes  HLD-LDL 51 09/2023  DM2 A1C 7.0 04/2023        Dispo:    Signed, Olivia Pavy, PA-C

## 2023-09-26 DIAGNOSIS — M65332 Trigger finger, left middle finger: Secondary | ICD-10-CM | POA: Diagnosis not present

## 2023-09-26 DIAGNOSIS — M65341 Trigger finger, right ring finger: Secondary | ICD-10-CM | POA: Diagnosis not present

## 2023-09-26 DIAGNOSIS — M65331 Trigger finger, right middle finger: Secondary | ICD-10-CM | POA: Diagnosis not present

## 2023-09-26 DIAGNOSIS — M720 Palmar fascial fibromatosis [Dupuytren]: Secondary | ICD-10-CM | POA: Diagnosis not present

## 2023-09-26 DIAGNOSIS — M65342 Trigger finger, left ring finger: Secondary | ICD-10-CM | POA: Diagnosis not present

## 2023-09-26 DIAGNOSIS — G5601 Carpal tunnel syndrome, right upper limb: Secondary | ICD-10-CM | POA: Diagnosis not present

## 2023-10-02 ENCOUNTER — Encounter: Payer: Self-pay | Admitting: Physician Assistant

## 2023-10-02 ENCOUNTER — Ambulatory Visit: Attending: Physician Assistant | Admitting: Physician Assistant

## 2023-10-02 VITALS — BP 138/75 | HR 95 | Ht 62.0 in | Wt 163.2 lb

## 2023-10-02 DIAGNOSIS — E7800 Pure hypercholesterolemia, unspecified: Secondary | ICD-10-CM | POA: Diagnosis not present

## 2023-10-02 DIAGNOSIS — I10 Essential (primary) hypertension: Secondary | ICD-10-CM

## 2023-10-02 DIAGNOSIS — I2583 Coronary atherosclerosis due to lipid rich plaque: Secondary | ICD-10-CM | POA: Diagnosis not present

## 2023-10-02 DIAGNOSIS — R0789 Other chest pain: Secondary | ICD-10-CM | POA: Diagnosis not present

## 2023-10-02 DIAGNOSIS — I2510 Atherosclerotic heart disease of native coronary artery without angina pectoris: Secondary | ICD-10-CM

## 2023-10-02 NOTE — Patient Instructions (Signed)
 Medication Instructions:  Your physician recommends that you continue on your current medications as directed. Please refer to the Current Medication list given to you today.  *If you need a refill on your cardiac medications before your next appointment, please call your pharmacy*  Lab Work: If you have labs (blood work) drawn today and your tests are completely normal, you will receive your results only by: MyChart Message (if you have MyChart) OR A paper copy in the mail If you have any lab test that is abnormal or we need to change your treatment, we will call you to review the results.  Follow-Up: At [facility], you and your health needs are our priority.  As part of our continuing mission to provide you with exceptional heart care, our providers are all part of one team.  This team includes your primary Cardiologist (physician) and Advanced Practice Providers or APPs (Physician Assistants and Nurse Practitioners) who all work together to provide you with the care you need, when you need it.  Your next appointment:   6 month(s)  Provider:   Wilbert Bihari, MD    We recommend signing up for the patient portal called MyChart.  Sign up information is provided on this After Visit Summary.  MyChart is used to connect with patients for Virtual Visits (Telemedicine).  Patients are able to view lab/test results, encounter notes, upcoming appointments, etc.  Non-urgent messages can be sent to your provider as well.   To learn more about what you can do with MyChart, go to [email or web address].

## 2023-10-19 DIAGNOSIS — E119 Type 2 diabetes mellitus without complications: Secondary | ICD-10-CM | POA: Diagnosis not present

## 2023-10-19 DIAGNOSIS — E1122 Type 2 diabetes mellitus with diabetic chronic kidney disease: Secondary | ICD-10-CM | POA: Diagnosis not present

## 2023-10-19 DIAGNOSIS — N183 Chronic kidney disease, stage 3 unspecified: Secondary | ICD-10-CM | POA: Diagnosis not present

## 2023-10-19 DIAGNOSIS — J449 Chronic obstructive pulmonary disease, unspecified: Secondary | ICD-10-CM | POA: Diagnosis not present

## 2023-10-25 DIAGNOSIS — I10 Essential (primary) hypertension: Secondary | ICD-10-CM | POA: Diagnosis not present

## 2023-10-25 DIAGNOSIS — Z794 Long term (current) use of insulin: Secondary | ICD-10-CM | POA: Diagnosis not present

## 2023-10-25 DIAGNOSIS — E041 Nontoxic single thyroid nodule: Secondary | ICD-10-CM | POA: Diagnosis not present

## 2023-10-25 DIAGNOSIS — E119 Type 2 diabetes mellitus without complications: Secondary | ICD-10-CM | POA: Diagnosis not present

## 2023-10-25 DIAGNOSIS — R059 Cough, unspecified: Secondary | ICD-10-CM | POA: Diagnosis not present

## 2023-10-25 DIAGNOSIS — J441 Chronic obstructive pulmonary disease with (acute) exacerbation: Secondary | ICD-10-CM | POA: Diagnosis not present

## 2023-10-25 DIAGNOSIS — M810 Age-related osteoporosis without current pathological fracture: Secondary | ICD-10-CM | POA: Diagnosis not present

## 2023-10-25 DIAGNOSIS — Z8639 Personal history of other endocrine, nutritional and metabolic disease: Secondary | ICD-10-CM | POA: Diagnosis not present

## 2023-10-29 DIAGNOSIS — M720 Palmar fascial fibromatosis [Dupuytren]: Secondary | ICD-10-CM | POA: Diagnosis not present

## 2023-10-29 DIAGNOSIS — M65341 Trigger finger, right ring finger: Secondary | ICD-10-CM | POA: Diagnosis not present

## 2023-10-29 DIAGNOSIS — M65342 Trigger finger, left ring finger: Secondary | ICD-10-CM | POA: Diagnosis not present

## 2023-10-29 DIAGNOSIS — M65331 Trigger finger, right middle finger: Secondary | ICD-10-CM | POA: Diagnosis not present

## 2023-10-29 DIAGNOSIS — M65332 Trigger finger, left middle finger: Secondary | ICD-10-CM | POA: Diagnosis not present

## 2023-11-06 DIAGNOSIS — Z1231 Encounter for screening mammogram for malignant neoplasm of breast: Secondary | ICD-10-CM | POA: Diagnosis not present

## 2023-11-06 DIAGNOSIS — N183 Chronic kidney disease, stage 3 unspecified: Secondary | ICD-10-CM | POA: Diagnosis not present

## 2023-11-06 DIAGNOSIS — E039 Hypothyroidism, unspecified: Secondary | ICD-10-CM | POA: Diagnosis not present

## 2023-11-06 DIAGNOSIS — E119 Type 2 diabetes mellitus without complications: Secondary | ICD-10-CM | POA: Diagnosis not present

## 2023-11-06 DIAGNOSIS — E1122 Type 2 diabetes mellitus with diabetic chronic kidney disease: Secondary | ICD-10-CM | POA: Diagnosis not present

## 2023-11-06 DIAGNOSIS — J441 Chronic obstructive pulmonary disease with (acute) exacerbation: Secondary | ICD-10-CM | POA: Diagnosis not present

## 2023-11-06 DIAGNOSIS — J449 Chronic obstructive pulmonary disease, unspecified: Secondary | ICD-10-CM | POA: Diagnosis not present

## 2023-11-08 DIAGNOSIS — H353211 Exudative age-related macular degeneration, right eye, with active choroidal neovascularization: Secondary | ICD-10-CM | POA: Diagnosis not present

## 2023-11-08 DIAGNOSIS — H35033 Hypertensive retinopathy, bilateral: Secondary | ICD-10-CM | POA: Diagnosis not present

## 2023-11-08 DIAGNOSIS — H43813 Vitreous degeneration, bilateral: Secondary | ICD-10-CM | POA: Diagnosis not present

## 2023-11-08 DIAGNOSIS — H353121 Nonexudative age-related macular degeneration, left eye, early dry stage: Secondary | ICD-10-CM | POA: Diagnosis not present

## 2023-11-14 DIAGNOSIS — M65332 Trigger finger, left middle finger: Secondary | ICD-10-CM | POA: Diagnosis not present

## 2023-11-14 DIAGNOSIS — M65342 Trigger finger, left ring finger: Secondary | ICD-10-CM | POA: Diagnosis not present

## 2023-11-14 DIAGNOSIS — M720 Palmar fascial fibromatosis [Dupuytren]: Secondary | ICD-10-CM | POA: Diagnosis not present

## 2023-11-14 DIAGNOSIS — M65331 Trigger finger, right middle finger: Secondary | ICD-10-CM | POA: Diagnosis not present

## 2023-11-14 DIAGNOSIS — M65341 Trigger finger, right ring finger: Secondary | ICD-10-CM | POA: Diagnosis not present

## 2023-11-17 DIAGNOSIS — E119 Type 2 diabetes mellitus without complications: Secondary | ICD-10-CM | POA: Diagnosis not present

## 2023-11-17 DIAGNOSIS — J449 Chronic obstructive pulmonary disease, unspecified: Secondary | ICD-10-CM | POA: Diagnosis not present

## 2023-11-17 DIAGNOSIS — E1122 Type 2 diabetes mellitus with diabetic chronic kidney disease: Secondary | ICD-10-CM | POA: Diagnosis not present

## 2023-11-17 DIAGNOSIS — N183 Chronic kidney disease, stage 3 unspecified: Secondary | ICD-10-CM | POA: Diagnosis not present

## 2023-12-07 DIAGNOSIS — N183 Chronic kidney disease, stage 3 unspecified: Secondary | ICD-10-CM | POA: Diagnosis not present

## 2023-12-07 DIAGNOSIS — E039 Hypothyroidism, unspecified: Secondary | ICD-10-CM | POA: Diagnosis not present

## 2023-12-07 DIAGNOSIS — J441 Chronic obstructive pulmonary disease with (acute) exacerbation: Secondary | ICD-10-CM | POA: Diagnosis not present

## 2023-12-07 DIAGNOSIS — J449 Chronic obstructive pulmonary disease, unspecified: Secondary | ICD-10-CM | POA: Diagnosis not present

## 2023-12-07 DIAGNOSIS — E1122 Type 2 diabetes mellitus with diabetic chronic kidney disease: Secondary | ICD-10-CM | POA: Diagnosis not present

## 2023-12-07 DIAGNOSIS — E119 Type 2 diabetes mellitus without complications: Secondary | ICD-10-CM | POA: Diagnosis not present

## 2023-12-13 DIAGNOSIS — I10 Essential (primary) hypertension: Secondary | ICD-10-CM | POA: Diagnosis not present

## 2023-12-17 DIAGNOSIS — E1122 Type 2 diabetes mellitus with diabetic chronic kidney disease: Secondary | ICD-10-CM | POA: Diagnosis not present

## 2023-12-17 DIAGNOSIS — J449 Chronic obstructive pulmonary disease, unspecified: Secondary | ICD-10-CM | POA: Diagnosis not present

## 2023-12-17 DIAGNOSIS — E119 Type 2 diabetes mellitus without complications: Secondary | ICD-10-CM | POA: Diagnosis not present

## 2023-12-17 DIAGNOSIS — N183 Chronic kidney disease, stage 3 unspecified: Secondary | ICD-10-CM | POA: Diagnosis not present

## 2023-12-19 DIAGNOSIS — Z4789 Encounter for other orthopedic aftercare: Secondary | ICD-10-CM | POA: Diagnosis not present

## 2023-12-19 DIAGNOSIS — M65331 Trigger finger, right middle finger: Secondary | ICD-10-CM | POA: Diagnosis not present

## 2023-12-19 DIAGNOSIS — M65341 Trigger finger, right ring finger: Secondary | ICD-10-CM | POA: Diagnosis not present

## 2023-12-31 DIAGNOSIS — M5412 Radiculopathy, cervical region: Secondary | ICD-10-CM | POA: Diagnosis not present

## 2024-01-02 DIAGNOSIS — M25641 Stiffness of right hand, not elsewhere classified: Secondary | ICD-10-CM | POA: Diagnosis not present

## 2024-01-06 DIAGNOSIS — N183 Chronic kidney disease, stage 3 unspecified: Secondary | ICD-10-CM | POA: Diagnosis not present

## 2024-01-06 DIAGNOSIS — E039 Hypothyroidism, unspecified: Secondary | ICD-10-CM | POA: Diagnosis not present

## 2024-01-06 DIAGNOSIS — J441 Chronic obstructive pulmonary disease with (acute) exacerbation: Secondary | ICD-10-CM | POA: Diagnosis not present

## 2024-01-06 DIAGNOSIS — J449 Chronic obstructive pulmonary disease, unspecified: Secondary | ICD-10-CM | POA: Diagnosis not present

## 2024-01-07 DIAGNOSIS — Z888 Allergy status to other drugs, medicaments and biological substances status: Secondary | ICD-10-CM | POA: Diagnosis not present

## 2024-01-07 DIAGNOSIS — Z9989 Dependence on other enabling machines and devices: Secondary | ICD-10-CM | POA: Diagnosis not present

## 2024-01-07 DIAGNOSIS — Z85828 Personal history of other malignant neoplasm of skin: Secondary | ICD-10-CM | POA: Diagnosis not present

## 2024-01-07 DIAGNOSIS — Z8673 Personal history of transient ischemic attack (TIA), and cerebral infarction without residual deficits: Secondary | ICD-10-CM | POA: Diagnosis not present

## 2024-01-07 DIAGNOSIS — Z885 Allergy status to narcotic agent status: Secondary | ICD-10-CM | POA: Diagnosis not present

## 2024-01-07 DIAGNOSIS — H353290 Exudative age-related macular degeneration, unspecified eye, stage unspecified: Secondary | ICD-10-CM | POA: Diagnosis not present

## 2024-01-07 DIAGNOSIS — Z791 Long term (current) use of non-steroidal anti-inflammatories (NSAID): Secondary | ICD-10-CM | POA: Diagnosis not present

## 2024-01-07 DIAGNOSIS — Z7982 Long term (current) use of aspirin: Secondary | ICD-10-CM | POA: Diagnosis not present

## 2024-01-07 DIAGNOSIS — E1122 Type 2 diabetes mellitus with diabetic chronic kidney disease: Secondary | ICD-10-CM | POA: Diagnosis not present

## 2024-01-07 DIAGNOSIS — E785 Hyperlipidemia, unspecified: Secondary | ICD-10-CM | POA: Diagnosis not present

## 2024-01-07 DIAGNOSIS — M1990 Unspecified osteoarthritis, unspecified site: Secondary | ICD-10-CM | POA: Diagnosis not present

## 2024-01-07 DIAGNOSIS — Z823 Family history of stroke: Secondary | ICD-10-CM | POA: Diagnosis not present

## 2024-01-07 DIAGNOSIS — K219 Gastro-esophageal reflux disease without esophagitis: Secondary | ICD-10-CM | POA: Diagnosis not present

## 2024-01-07 DIAGNOSIS — N1831 Chronic kidney disease, stage 3a: Secondary | ICD-10-CM | POA: Diagnosis not present

## 2024-01-07 DIAGNOSIS — J449 Chronic obstructive pulmonary disease, unspecified: Secondary | ICD-10-CM | POA: Diagnosis not present

## 2024-01-07 DIAGNOSIS — Z7951 Long term (current) use of inhaled steroids: Secondary | ICD-10-CM | POA: Diagnosis not present

## 2024-01-07 DIAGNOSIS — Z794 Long term (current) use of insulin: Secondary | ICD-10-CM | POA: Diagnosis not present

## 2024-01-07 DIAGNOSIS — Z90710 Acquired absence of both cervix and uterus: Secondary | ICD-10-CM | POA: Diagnosis not present

## 2024-01-07 DIAGNOSIS — R32 Unspecified urinary incontinence: Secondary | ICD-10-CM | POA: Diagnosis not present

## 2024-01-07 DIAGNOSIS — E039 Hypothyroidism, unspecified: Secondary | ICD-10-CM | POA: Diagnosis not present

## 2024-01-07 DIAGNOSIS — G4700 Insomnia, unspecified: Secondary | ICD-10-CM | POA: Diagnosis not present
